# Patient Record
Sex: Female | Born: 1965 | Hispanic: Yes | Marital: Single | Smoking: Never smoker
Health system: Western US, Academic
[De-identification: ages and names within clinical notes are randomized; demographics above are authoritative.]

## PROBLEM LIST (undated history)

## (undated) ENCOUNTER — Emergency Department: Payer: MEDICAID | Attending: Emergency Medicine | Admitting: Emergency Medicine

## (undated) DIAGNOSIS — R112 Nausea with vomiting, unspecified: Secondary | ICD-10-CM

## (undated) DIAGNOSIS — E119 Type 2 diabetes mellitus without complications: Secondary | ICD-10-CM

## (undated) DIAGNOSIS — L52 Erythema nodosum: Secondary | ICD-10-CM

## (undated) DIAGNOSIS — N200 Calculus of kidney: Secondary | ICD-10-CM

## (undated) DIAGNOSIS — M222X9 Patellofemoral disorders, unspecified knee: Secondary | ICD-10-CM

## (undated) DIAGNOSIS — J45909 Unspecified asthma, uncomplicated: Secondary | ICD-10-CM

## (undated) DIAGNOSIS — Z9289 Personal history of other medical treatment: Secondary | ICD-10-CM

## (undated) DIAGNOSIS — Z87442 Personal history of urinary calculi: Secondary | ICD-10-CM

## (undated) HISTORY — PX: CHOLECYSTECTOMY: SHX55

## (undated) HISTORY — PX: KNEE SURGERY: SHX244

## (undated) HISTORY — PX: LITHOTRIPSY: SUR834

## (undated) HISTORY — DX: Patellofemoral disorders, unspecified knee: M22.2X9

## (undated) HISTORY — DX: Calculus of kidney: N20.0

## (undated) HISTORY — DX: Unspecified asthma, uncomplicated: J45.909

## (undated) HISTORY — DX: Type 2 diabetes mellitus without complications: E11.9

## (undated) HISTORY — DX: Personal history of other medical treatment: Z92.89

## (undated) HISTORY — DX: Erythema nodosum: L52

---

## 1992-10-04 HISTORY — PX: LIGATION, FALLOPIAN TUBE, BILATERAL, USING TUBAL RING: SHX003529

## 1992-12-05 ENCOUNTER — Other Ambulatory Visit: Payer: Self-pay

## 1996-01-20 ENCOUNTER — Other Ambulatory Visit: Payer: Self-pay

## 1996-05-11 ENCOUNTER — Other Ambulatory Visit: Payer: Self-pay

## 2007-05-13 ENCOUNTER — Emergency Department: Payer: Self-pay | Admitting: Internal Medicine

## 2007-05-16 ENCOUNTER — Ambulatory Visit: Payer: Self-pay | Admitting: Internal Medicine

## 2008-11-13 ENCOUNTER — Ambulatory Visit: Payer: Self-pay

## 2010-04-22 ENCOUNTER — Ambulatory Visit: Payer: Self-pay | Admitting: Family Medicine

## 2010-04-22 ENCOUNTER — Emergency Department: Payer: Self-pay | Admitting: Unknown Physician Specialty

## 2011-01-04 ENCOUNTER — Ambulatory Visit: Payer: Self-pay | Admitting: Family Medicine

## 2012-07-08 HISTORY — PX: COLONOSCOPY: SHX000273

## 2012-12-02 ENCOUNTER — Inpatient Hospital Stay: Payer: Self-pay | Admitting: Internal Medicine

## 2012-12-02 ENCOUNTER — Ambulatory Visit: Payer: Self-pay | Admitting: Urology

## 2012-12-02 LAB — URINALYSIS, COMPLETE
Bilirubin,UR: NEGATIVE
Glucose,UR: NEGATIVE mg/dL (ref 0–75)
Nitrite: NEGATIVE
Protein: NEGATIVE
RBC,UR: 2 /HPF (ref 0–5)
Specific Gravity: 1.012 (ref 1.003–1.030)
Squamous Epithelial: 13
WBC UR: 25 /HPF (ref 0–5)

## 2012-12-02 LAB — CBC
HCT: 42.5 % (ref 35.0–47.0)
HGB: 14.9 g/dL (ref 12.0–16.0)
HGB: 12.5 g/dL (ref 12.0–16.0)
MCH: 33.5 pg (ref 26.0–34.0)
MCHC: 35.2 g/dL (ref 32.0–36.0)
MCHC: 33.3 g/dL (ref 32.0–36.0)
MCV: 95 fL (ref 80–100)
MCV: 96 fL (ref 80–100)
Platelet: 245 10*3/uL (ref 150–440)
Platelet: 197 10*3/uL (ref 150–440)
RBC: 4.46 10*6/uL (ref 3.80–5.20)
RBC: 3.89 10*6/uL (ref 3.80–5.20)
RDW: 12.6 % (ref 11.5–14.5)
RDW: 12.3 % (ref 11.5–14.5)
WBC: 13.7 10*3/uL — ABNORMAL HIGH (ref 3.6–11.0)

## 2012-12-02 LAB — COMPREHENSIVE METABOLIC PANEL
Anion Gap: 8 (ref 7–16)
Anion Gap: 5 — ABNORMAL LOW (ref 7–16)
BUN: 12 mg/dL (ref 7–18)
BUN: 11 mg/dL (ref 7–18)
Bilirubin,Total: 1.2 mg/dL — ABNORMAL HIGH (ref 0.2–1.0)
Calcium, Total: 8.5 mg/dL (ref 8.5–10.1)
Chloride: 105 mmol/L (ref 98–107)
Chloride: 110 mmol/L — ABNORMAL HIGH (ref 98–107)
Co2: 26 mmol/L (ref 21–32)
EGFR (African American): >60
EGFR (Non-African Amer.): >60
Glucose: 95 mg/dL (ref 65–99)
Potassium: 3.3 mmol/L — ABNORMAL LOW (ref 3.5–5.1)
Potassium: 3.5 mmol/L (ref 3.5–5.1)
SGOT(AST): 17 U/L (ref 15–37)
SGPT (ALT): 74 U/L (ref 12–78)
Sodium: 138 mmol/L (ref 136–145)
Sodium: 141 mmol/L (ref 136–145)
Total Protein: 7.3 g/dL (ref 6.4–8.2)
Total Protein: 5.9 g/dL — ABNORMAL LOW (ref 6.4–8.2)

## 2012-12-02 LAB — LIPASE, BLOOD
Lipase: 88 U/L (ref 73–393)
Lipase: 58 U/L — ABNORMAL LOW (ref 73–393)

## 2012-12-03 LAB — BASIC METABOLIC PANEL
Anion Gap: 4 — ABNORMAL LOW (ref 7–16)
BUN: 11 mg/dL (ref 7–18)
Calcium, Total: 8.1 mg/dL — ABNORMAL LOW (ref 8.5–10.1)
Chloride: 114 mmol/L — ABNORMAL HIGH (ref 98–107)
Co2: 25 mmol/L (ref 21–32)
EGFR (African American): >60
EGFR (Non-African Amer.): >60
Glucose: 98 mg/dL (ref 65–99)
Osmolality: 284 (ref 275–301)
Potassium: 3.7 mmol/L (ref 3.5–5.1)
Sodium: 143 mmol/L (ref 136–145)

## 2012-12-03 LAB — CBC WITH DIFFERENTIAL/PLATELET
Basophil #: 0 10*3/uL (ref 0.0–0.1)
Eosinophil %: 0.6 %
HCT: 35.1 % (ref 35.0–47.0)
HGB: 12.1 g/dL (ref 12.0–16.0)
Lymphocyte %: 9.9 %
MCH: 33.4 pg (ref 26.0–34.0)
MCHC: 34.5 g/dL (ref 32.0–36.0)
Monocyte #: 0.6 x10 3/mm (ref 0.2–0.9)
Neutrophil %: 82.8 %
Platelet: 190 10*3/uL (ref 150–440)
RBC: 3.63 10*6/uL — ABNORMAL LOW (ref 3.80–5.20)
RDW: 12.5 % (ref 11.5–14.5)

## 2012-12-03 LAB — URIC ACID: Uric Acid: 2.9 mg/dL (ref 2.6–6.0)

## 2012-12-05 LAB — URINE CULTURE

## 2012-12-08 LAB — CULTURE, BLOOD (SINGLE)

## 2012-12-13 ENCOUNTER — Ambulatory Visit: Payer: Self-pay | Admitting: Urology

## 2012-12-25 ENCOUNTER — Ambulatory Visit: Payer: Self-pay | Admitting: Urology

## 2012-12-27 ENCOUNTER — Ambulatory Visit: Payer: Self-pay | Admitting: Urology

## 2013-01-01 ENCOUNTER — Ambulatory Visit: Payer: Self-pay | Admitting: Urology

## 2013-01-09 ENCOUNTER — Ambulatory Visit: Payer: Self-pay | Admitting: Urology

## 2013-01-12 DIAGNOSIS — N200 Calculus of kidney: Secondary | ICD-10-CM | POA: Insufficient documentation

## 2013-02-02 ENCOUNTER — Ambulatory Visit: Payer: Self-pay | Admitting: Urology

## 2014-12-06 ENCOUNTER — Ambulatory Visit: Payer: Self-pay | Admitting: Primary Care

## 2014-12-20 ENCOUNTER — Ambulatory Visit: Payer: Self-pay | Admitting: Primary Care

## 2015-01-24 NOTE — Op Note (Signed)
PATIENT NAME:  Dana GottronGUZMAN, Eevee MR#:  [id number] DATE OF BIRTH:  06/10/1966  DATE OF SURGERY:  12/28/2012  PREOPERATIVE DIAGNOSIS: Right renal pelvic calculus.   POSTOPERATIVE DIAGNOSIS: Right renal pelvic calculus.   PROCEDURES:   1.  Right ureteropyeloscopy with laser lithotripsy.  2.  Placement of right ureteral stent.   SURGEON: Dr. Lonna CobbStoioff   ASSISTANT: None.   ANESTHESIA: General.   INDICATION: A 49 year old female presented approximately 3 weeks ago with right renal colic and found to have a 1.8 cm right UPJ stone. She had a ureteral stent placed by the covering urologist. The stone density was greater than 1000 Hounsfield units. After discussion of options, she has elected attempt at ureteroscopic removal.   DESCRIPTION: She was taken to the operating room where general anesthetic was administered. Her external genitalia were prepped in the usual fashion. A time-out was performed per protocol, with all in agreement. A 21-French cystoscope sheath with obturator was lubricated and passed per urethra. No bladder mucosal lesions were identified. The right ureteral stent was grasped and brought out through the meatus. A 0.035 guidewire was then placed through the stent and into the right renal pelvis past the stone, which was easily seen on fluoroscopy. The ureteral stent was removed. An Olympus digital flexible ureteroscope was then  passed per urethra. The right ureteral orifice was easily engaged and passed proximally without use of a guidewire or sheath. A stone was easily visualized in the renal pelvis. A 200 micron  laser fiber was placed through the ureteroscope. At a power of 5 watts with a rapid rate, the stone was easily broken into multiple small fragments. These fragments were further fragmented using "popcorn" settings. No larger fragments were seen on fluoroscopy. The ureteroscope was removed. A 6-French/22 cm Contour ureteral stent was placed. There was good curl seen in the renal  pelvis on fluoroscopy. The distal end of the stent was well-positioned in the bladder. The patient was taken to PACU in stable condition. There were no complications.   EBL: Minimal.     ____________________________ Verna CzechScott C. Lonna CobbStoioff, MD scs:dm D: 12/28/2012 07:51:00 ET T: 12/28/2012 08:11:39 ET JOB#: [id number]  cc: Lorin PicketScott C. Lonna CobbStoioff, MD, <Dictator> Riki AltesSCOTT C STOIOFF MD ELECTRONICALLY SIGNED 01/11/2013 8:03

## 2015-01-24 NOTE — Discharge Summary (Signed)
PATIENT NAME:  Dana Jimenez, Dana Jimenez MR#:  [id number] DATE OF BIRTH:  02-03-1966  DATE OF ADMISSION:  12/02/2012 DATE OF DISCHARGE:  12/04/2012  ADMITTING PHYSICIAN: Dr. Elpidio AnisSudini.    DISCHARGING PHYSICIAN: Dr. Enid Baasadhika Kalisetti   CONSULTATIONS IN THE HOSPITAL: Urology consultation by Dr. Milus HeightEdward Houser.   PR[facility] C[facility] PHYSIC[facility].     DISCHARGE DIAGNOSES:  1.  A 1.8 cm right ureteral stone blocking the ureteropelvic junction.  2.  Moderate right-sided hydronephrosis, status post ureteral stent placement.  3.  Urinary tract infection.  4.  Pyelonephritis.  5.  Constipation.   DISCHARGE MEDICATIONS: 1.  Norco 5/325mg , 1 tablet every 4 hours as needed for pain.  2.  Levaquin 500 mg p.o. daily for 3 more days.   DISCHARGE DIET: Regular diet.   DISCHARGE ACTIVITY: As tolerated.    FOLLOWUP INSTRUCTIONS:  Urology followup with Dr. Lonna CobbStoioff in 1 to 2 weeks.   LABS AND IMAGING STUDIES:  1.  WBC 10.1, hemoglobin 12.1, hematocrit 35.1, platelet count 190.  2.  Sodium 143, potassium 3.7, chloride 114, bicarbonate 25, BUN 11 and creatinine 0.76. Glucose is 98 and calcium 8.1.  3.  Uric acid is 2.9.  4.  ANA panel is negative.  5.  Blood cultures were negative.  6.  Urine cultures are growing 70,000 colonies of pansensitive Escherichia coli.  7.  CT of the abdomen and pelvis workup showing 18 mm calculus at the right ureteropelvic junction resulting in moderate right-sided hydronephrosis and perinephric stranding.  8.  Right and left ankle x-rays revealed normal ankles but on the left side there a lucent lesion.  It could be cyst or lipoma in the mid left calcaneus but no significant joint effusion or acute abnormalities.    BRIEF HOSPITAL COURSE: The patient is a 49 year old young female with no significant past medical history other than history of nephrolithiasis, comes to the hospital secondary to right flank pain. CT of the abdomen revealed hydronephrosis with 1.8 cm stone.  1.   Acute pyelonephritis and UTI secondary to right ureteral stone resulting in moderate right-sided hydronephrosis. She was admitted and was started on IV antibiotics for her infection and was seen by urology and had urgent cystoscopy and stent placement in right ureteropelvic junction. Her abdominal pain subsided, white count improved and she was able to ambulate well without any difficulty. She is supposed to follow up with urology as an outpatient in 1 to 2 weeks. She is being discharged on Levaquin to finish off an antibiotic course for her urinary tract infection.  2.  Also, post procedure the patient had pain in both ankles, trouble with dorsiflexion, possibly inflammatory polyarthritis which was equal bilaterally; however, that improved after 1 dose of steroid though the inflammatory markers were negative. She has not had further trouble and was ambulating well prior to discharge. She is being discharged on pain medication to be used as needed. Her course has been otherwise uneventful in the hospital.   DISCHARGE CONDITION: Stable.   DISCHARGE DISPOSITION: Home.   TIME SPENT ON DISCHARGE: 40 minutes.     ____________________________ Enid Baasadhika Kalisetti, MD rk:cs D: 12/05/2012 17:15:00 ET T: 12/05/2012 19:08:08 ET JOB#: [id number]  cc: Enid Baasadhika Kalisetti, MD, <Dictator> Enid BaasADHIKA KALISETTI MD ELECTRONICALLY SIGNED 12/06/2012 13:34

## 2015-01-24 NOTE — H&P (Signed)
PATIENT NAME:  Dana Jimenez, Dana Jimenez MR#:  [id number] DATE OF BIRTH:  05-10-1966  DATE OF ADMISSION:  12/02/2012  PRIMARY CARE PHYSICIAN:  [facility]. The patient visits as needed.   CHIEF COMPLAINT:  Right flank pain.   HISTORY OF PRESENTING ILLNESS:  The patient is a 49 year old female patient with a history of nephrolithiasis come to the hospital with acute onset of right flank pain and chills. The patient has had this pain radiate to her groin and the back. In the Emergency Room, she has had nausea but no vomiting, no shortness of breath, nausea and vomiting. No aggravating factors of the pain. CT scan of the abdomen showed 18 mm calculus in the UPJ junction with hydronephrosis and perinephric standing. UA shows UTI and the Hospitalist team admitting the patient.   PAST MEDICAL HISTORY:  Nephrolithiasis.   SOCIAL HISTORY:  The patient does not smoke. No alcohol. No illicit drugs. Works as a Location manager.   CODE STATUS:  FULL CODE.   FAMILY HISTORY:  History of uterine cancer in mother and skin cancer in her dad with which they died of. Sister has diabetes.  ALLERGIES:  No known drug allergies.   HOME MEDICATIONS:  None.   REVIEW OF SYSTEMS: CONSTITUTIONAL:  Complains of some fatigue. No weight loss, weight gain.  EYES:  No blurred vision, pain, or redness.  ENT:  No tinnitus, hearing loss, or nasal congestion.  CARDIOVASCULAR:  No chest pain, syncope, PND, or orthopnea.  RESPIRATORY:  No shortness of breath, wheezing, hemoptysis.  GASTROINTESTINAL:  Has nausea. No vomiting, has abdominal pain. No diarrhea.  GENITOURINARY:  Has the UPJ right-sided 80 mm calculus.  ENDOCRINE:  No hypothyroidism, polyuria or polydipsia.  HEMATOLOGIC:  No anemia, easy bruising, bleeding.  MUSCULOSKELETAL:  No arthritis, back pain.  NEUROLOGIC:  No focal numbness, weakness, dysarthria.  PSYCHIATRIC:  No anxiety or depression.   PHYSICAL EXAMINATION:  VITAL SIGNS:  Temperature 97.6, pulse 82, blood  pressure 140/84, saturating 99% on room air.  GENERAL:  Moderately built female patient lying in bed, comfortable, conversational, cooperative with exam.  PSYCHIATRIC:  Alert, oriented x 3. Mood and affect are appropriate. Judgment intact.  HEENT:  Atraumatic, normocephalic. Oral mucosa are moist and pink. External ears and nose normal. No pallor or icterus. Pupils bilaterally equal and reactive to light.  NECK:  Supple. No thyromegaly. No palpable lymph nodes. Trachea midline. No carotid bruit, JVD.  CARDIOVASCULAR:  S1, S2, regular rate and rhythm without any murmurs.  RESPIRATORY:  Normal work of breathing. Clear to auscultation on both sides.  ABDOMEN:  Soft abdomen. Tenderness in the right flank area. No hepatosplenomegaly palpable. Bowel sounds present.  GENITOURINARY:  No CVA tenderness, bladder distention.  SKIN:  Warm and dry. No petechiae, rash, ulcers.  MUSCULOSKELETAL:  No joint swelling, redness, effusion of the large joints. Normal muscle tone.  NEUROLOGICAL:  Motor strength 5/5 in upper and lower extremities. Sensation to fine touch intact all over. Cranial nerves II through XII intact.  LYMPHATIC:  No cervical lymphadenopathy.   LABORATORY, DIAGNOSTIC, AND RADIOLOGICAL DATA:  Glucose of 95, BUN 12, creatinine 0.82 with potassium of 3.3. WBC 6.3, hemoglobin 14.9. Urinalysis shows 1+ bacteria and 25 WBCs with 2 RBC.   CT scan of the abdomen shows 18 mm right UPJ calculus with hydronephrosis and perinephric stranding.   ASSESSMENT AND PLAN:  1.  Right-sided 18 mm ureteropelvic junction calculus with hydronephrosis and perinephric stranding in the setting of urinary tract infection. This is a  urological emergency and the patient will be admitted. I have discussed with the urologist, Dr. Berna SpareMarcus, who will take the patient to the OR today. We will get started the patient started on stat IV antibiotics with ureter calculus and urinary tract infection. We will get blood cultures, urine  cultures. The patient has high risk for deterioration and needs to be closely monitored as inpatient. If the patient is afebrile and does not have any elevated white count, she should be able to go home tomorrow after urological stent is placed today.  2.  Hypokalemia. Replace as needed.  3.  Deep vein thrombosis prophylaxis with heparin.   CODE STATUS:  FULL CODE.   TIME SPENT:  60 minutes.   ____________________________ Molinda BailiffSrikar R. Tereso Unangst, MD srs:jm D: 12/02/2012 16:51:44 ET T: 12/02/2012 17:48:10 ET JOB#: [id number]  cc: Wardell HeathSrikar R. Marshel Golubski, MD, <Dictator> [facility] Jerimiah Wolman [place] Bali Elfrida Pixley MD ELECTRONICALLY SIGNED 12/04/2012 14:51

## 2015-01-24 NOTE — Consult Note (Signed)
Chief Complaint:  Subjective/Chief Complaint POD 1  no events no c/o afebrile overnight   VITAL SIGNS/ANCILLARY NOTES: **Vital Signs.:   02-Mar-14 05:45  Vital Signs Type Routine  Temperature Temperature (F) 98  Celsius 36.6  Temperature Source oral  Pulse Pulse 89  Respirations Respirations 18  Systolic BP Systolic BP 91  Diastolic BP (mmHg) Diastolic BP (mmHg) 55  Mean BP 67  Pulse Ox % Pulse Ox % 96  Pulse Ox Activity Level  At rest  Oxygen Delivery Room Air/ 21 %  *Intake and Output.:   02-Mar-14 04:11  Grand Totals Intake:  2396 Output:      Net:  2396 24 Hr.:  2396  Oral Intake      In:  480  IV (Primary)      In:  1916   Brief Assessment:  Cardiac Regular   Respiratory normal resp effort   Gastrointestinal Normal   Gastrointestinal details normal Soft   Additional Physical Exam foley - clear   Lab Results:  Routine Chem:  02-Mar-14 04:45   Glucose, Serum 98  BUN 11  Creatinine (comp) 0.76  Sodium, Serum 143  Potassium, Serum 3.7  Chloride, Serum  114  CO2, Serum 25  Calcium (Total), Serum  8.1  Anion Gap  4  Osmolality (calc) 284  eGFR (African American) >60  eGFR (Non-African American) >60 (eGFR values <32m/min/1.73 m2 may be an indication of chronic kidney disease (CKD). Calculated eGFR is useful in patients with stable renal function. The eGFR calculation will not be reliable in acutely ill patients when serum creatinine is changing rapidly. It is not useful in  patients on dialysis. The eGFR calculation may not be applicable to patients at the low and high extremes of body sizes, pregnant women, and vegetarians.)  Routine Hem:  02-Mar-14 04:45   WBC (CBC) 10.1  RBC (CBC)  3.63  Hemoglobin (CBC) 12.1  Hematocrit (CBC) 35.1  Platelet Count (CBC) 190  MCV 97  MCH 33.4  MCHC 34.5  RDW 12.5  Neutrophil % 82.8  Lymphocyte % 9.9  Monocyte % 6.3  Eosinophil % 0.6  Basophil % 0.4  Neutrophil #  8.4  Lymphocyte # 1.0  Monocyte # 0.6   Eosinophil # 0.1  Basophil # 0.0 (Result(s) reported on 03 Dec 2012 at 05:26AM.)   Assessment/Plan:  Assessment/Plan:  Assessment S/P R Ureteral Stent Placement for 1.8cm R UPJ stone, no sign of urosepsis overnight   Plan - D/C foley - Ok for D/C from GU perspective - Reccomend oupt abx 7-14 days - F/u with Urologist for definitive stone management (Dr. SBernardo Heater   Electronic Signatures: HFelicity Coyer(MD)  (Signed 02-Mar-14 06:13)  Authored: Chief Complaint, VITAL SIGNS/ANCILLARY NOTES, Brief Assessment, Lab Results, Assessment/Plan   Last Updated: 02-Mar-14 06:13 by HFelicity Coyer(MD)

## 2015-01-24 NOTE — Consult Note (Signed)
   DATE OF BIRTH:  1966-03-12  DATE OF CONSULTATION:  12/02/2012  REFERRING PHYSICIAN:  Emergency Room  CONSULTING PHYSICIAN:  Tawni MillersEdward R. Weldon InchesHouser, II, MD  REASON FOR CONSULTATION:  Obstructing renal stone.   REPORT OF CONSULTATION:  The patient is a 49 year old female with a history of  known nephrolithiasis many years ago, status post unknown surgical procedure for these, who presented to the hospital with 3 days of acute onset right flank pain, chills, nausea, vomiting. Pain radiates from her back to her groin. In the Emergency Room, she did have nausea, but no vomiting. She denies any shortness of breath or any subjective fever. CT scan obtained by the ER shows a 1.8 cm right ureteropelvic junction stone with obstruction. Her UA  is concerning for a urinary tract infection. She is currently being admitted by the hospitalist team, and I am requested for consultation.   PAST MEDICAL HISTORY:  Nephrolithiasis.   SOCIAL HISTORY:  No smoking, no alcohol.   FAMILY HISTORY:  Uterine carcinoma, otherwise noncontributory.   ALLERGIES:  No known drug allergies.  OUTPATIENT MEDICATIONS:  None.  REVIEW OF SYSTEMS:  All of the 12-point review of systems negative, other than outlined above in the HPI.  PHYSICAL EXAMINATION:  VITAL SIGNS:  The patient's temperature is 97.6, heart rate, blood pressure 140/84, satting 99% on room air.  GENERAL:  She is alert and oriented x3, conversational, interactive, healthy-appearing female.  PSYCHIATRIC:  Appropriate.  HEENT:  Normocephalic, atraumatic, nonicteric.  NECK:  Supple, without any lymphadenopathy.  CARDIOVASCULAR:  Regular rate and rhythm.  RESPIRATORY:  Nonlabored and clear anteriorly. ABDOMEN:  Soft, nontender, nondistended. She has some tenderness in the right costovertebral area. She has normal bowel sounds.  GENITOURINARY:  Normal external female genitalia.  SKIN:  Warm and dry.  MUSCULOSKELETAL:  Strength 5/5.  NEUROLOGIC:  Grossly intact.   EXTREMITIES:  No cyanosis, clubbing or edema.   LABORATORY VALUES:  The patient's creatinine is 0.82. She has a white blood cell count of 16.3.  IMAGING:  CT scan report and images reviewed by me, agree with read of 1.8 cm right UPJ stone with hydronephrosis, perinephric stranding.   ASSESSMENT: A 49 year old female with obstructing right ureteral stone in the setting of a presumed urinary tract infection. The patient will not be able to pass this stone. Therefore, we elected to proceed with right ureteral stent placement. She was counseled as such. She understands there will be no definitive treatment of the stone today. She has received Rocephin by the hospitalist service and will be admitted to them. I am arranging for her to undergo right ureteral stent placement. She will get definitive stone management with a urologist as an outpatient.      ____________________________ Tawni MillersEdward R. Weldon InchesHouser, II, MD erh:mr D: 12/02/2012 18:16:17 ET T: 12/02/2012 18:59:32 ET JOB#: [id number]  cc: Tawni MillersEdward R. Weldon InchesHouser, II, MD, <Dictator> Beaulah CorinEDWARD R Anastasha Ortez MD ELECTRONICALLY SIGNED 12/03/2012 6:06

## 2015-01-24 NOTE — Op Note (Signed)
PATIENT NAME:  Dana Jimenez, Dana Jimenez MR#:  [id number] DATE OF BIRTH:  October 12, 1965  DATE OF PROCEDURE:  12/02/2012  PREOPERATIVE DIAGNOSIS: Right obstructing 1.8 cm ureteropelvic junction stone.  POSTOPERATIVE DIAGNOSIS: Right obstructing 1.8 cm ureteropelvic junction stone.  PROCEDURE PERFORMED: Cystoscopy with right ureteral stent placement and right retrograde pyelogram with interpretation.   SURGEON: Milus HeightEdward Stephnie Parlier, MD.  ASSISTANT: None.   ANESTHESIA: General.   COMPLICATIONS: None.  BLOOD LOSS: None.   INTRAOPERATIVE FINDINGS: A  1.8 cm right UPJ stone easily seen under fluoroscopy.   INDICATION: The patient is a 49 year old female presenting with nausea, vomiting and uncontrolled flank pain to the ER. She was found to have a 1.8 cm right ureteral pelvic junction obstructing stone with proximal hydronephrosis as well as concern for urinary tract infection. All risks, benefits, and alternatives of right ureteral stent were fully explained to the patient preoperatively. She understands no definitive stone management will be done to the stone today unless there is concern for infection and she agrees to proceed with a right ureteral stent placement.   DESCRIPTION OF PROCEDURE: The patient was identified in the preoperative holding. She was brought back to the operating room. She had been given 1 gram of Ancef. She was then placed under general anesthesia, prepped and draped in sterile dorsal lithotomy with position all pressure points padded appropriately. Timeout was then called for operative site, operation to be performed  and correct patient. All present were in agreement. Attention was then turned to the cystoscopy portion of the case. A 21-French cystoscope was used and inserted in the bladder. The bladder was found with a mass or lesion. The right ureteral orifice was easily cannulated. A 0.35 Glidewire through a 5-French open-ended ureteral catheter. The wire was inserted up past our easily  seen stone at the UPJ into the renal pelvis. The 5-French open-ended ureteral catheter was advanced over this and then a hydronephrotic drip was noted once the wire was removed. I did send samples of this for culture with aspiration. I then did a gentle retrograde pyelogram using 5 mL of 50% Isovue contrast outlining the hydronephrotic system. I then replaced the wire and removed the 5-French open-ended ureteral catheter. Then I placed a 6-French x 24 cm right double-J stent using a combination of direct visualization and fluoroscopic imaging. Excellent coil was noted distally visually in the bladder and stent with was coiled in the lower pole under fluoroscopic imaging. Immediately she began to express purulent urine from the right UO; therefore, I terminated the procedure and placed a 16-French Foley catheter to gravity bag drainage with 10 mL of sterile water. The patient was awakened from anesthesia and transitioned to PACU in stable condition.   DISPOSITION: She will be admitted to the floor by the hospitalist for perioperative management. ____________________________ Tawni MillersEdward R. Weldon InchesHouser, II, MD erh:aw D: 12/02/2012 18:12:01 ET T: 12/03/2012 06:44:46 ET JOB#: [id number]  cc: Tawni MillersEdward R. Weldon InchesHouser, II, MD, <Dictator> Beaulah CorinEDWARD R Jammal Sarr MD ELECTRONICALLY SIGNED 12/03/2012 20:56

## 2015-01-26 IMAGING — CR DG ANKLE COMPLETE 3+V*L*
1 series · 5 of 5 positions shown · non-contrast
Comparison: none

REASON FOR EXAM: pain and decreased motion
COMMENTS:

PROCEDURE:     DXR - DXR ANKLE LEFT COMPLETE  - December 03, 2012  [DATE]
RESULT:     Comparison: None

[Series 1: x ankle ap left · 0.14mm/px · 5 of 5 slices shown]
[im 1/5]
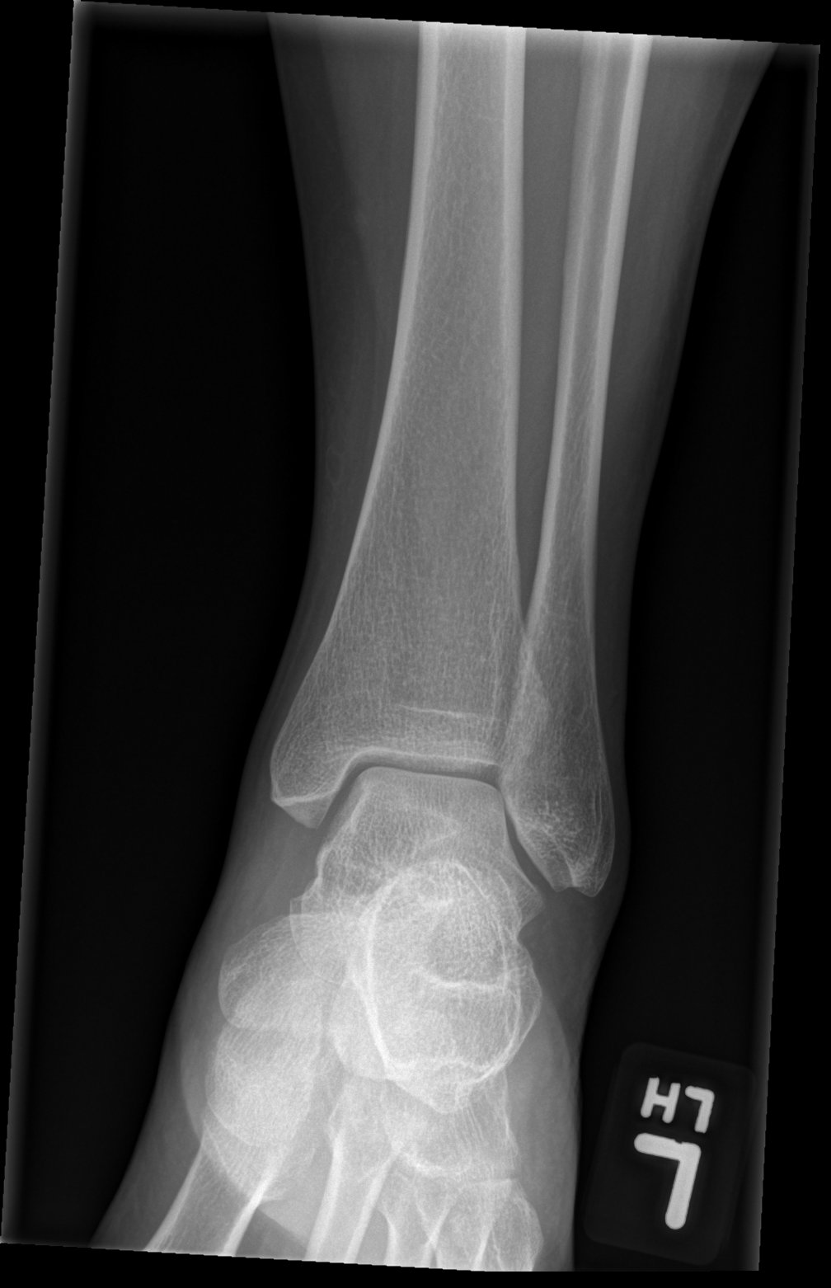
[im 2/5]
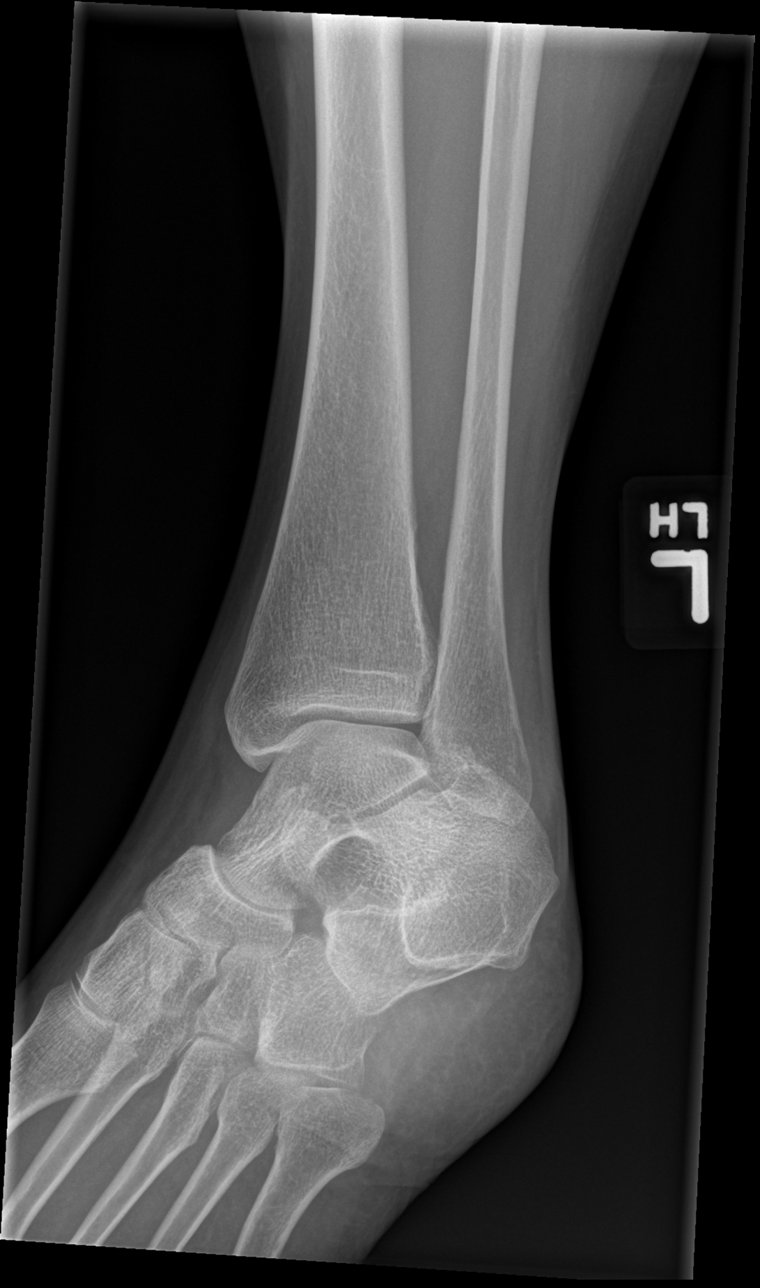
[im 3/5]
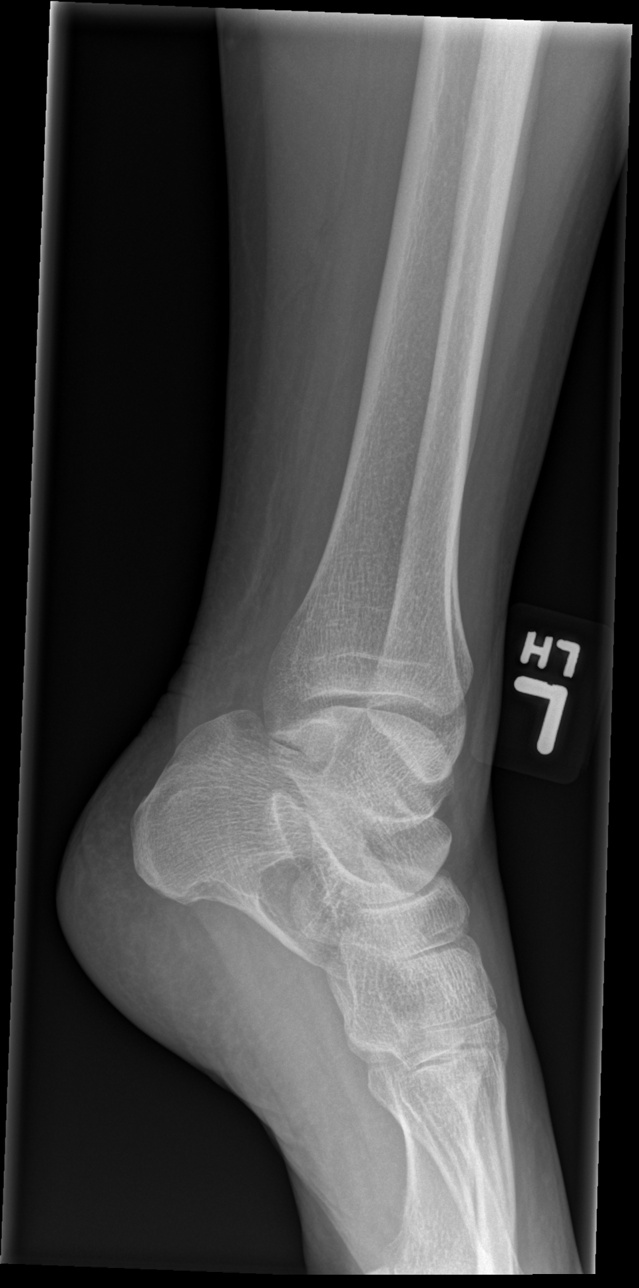
[im 4/5]
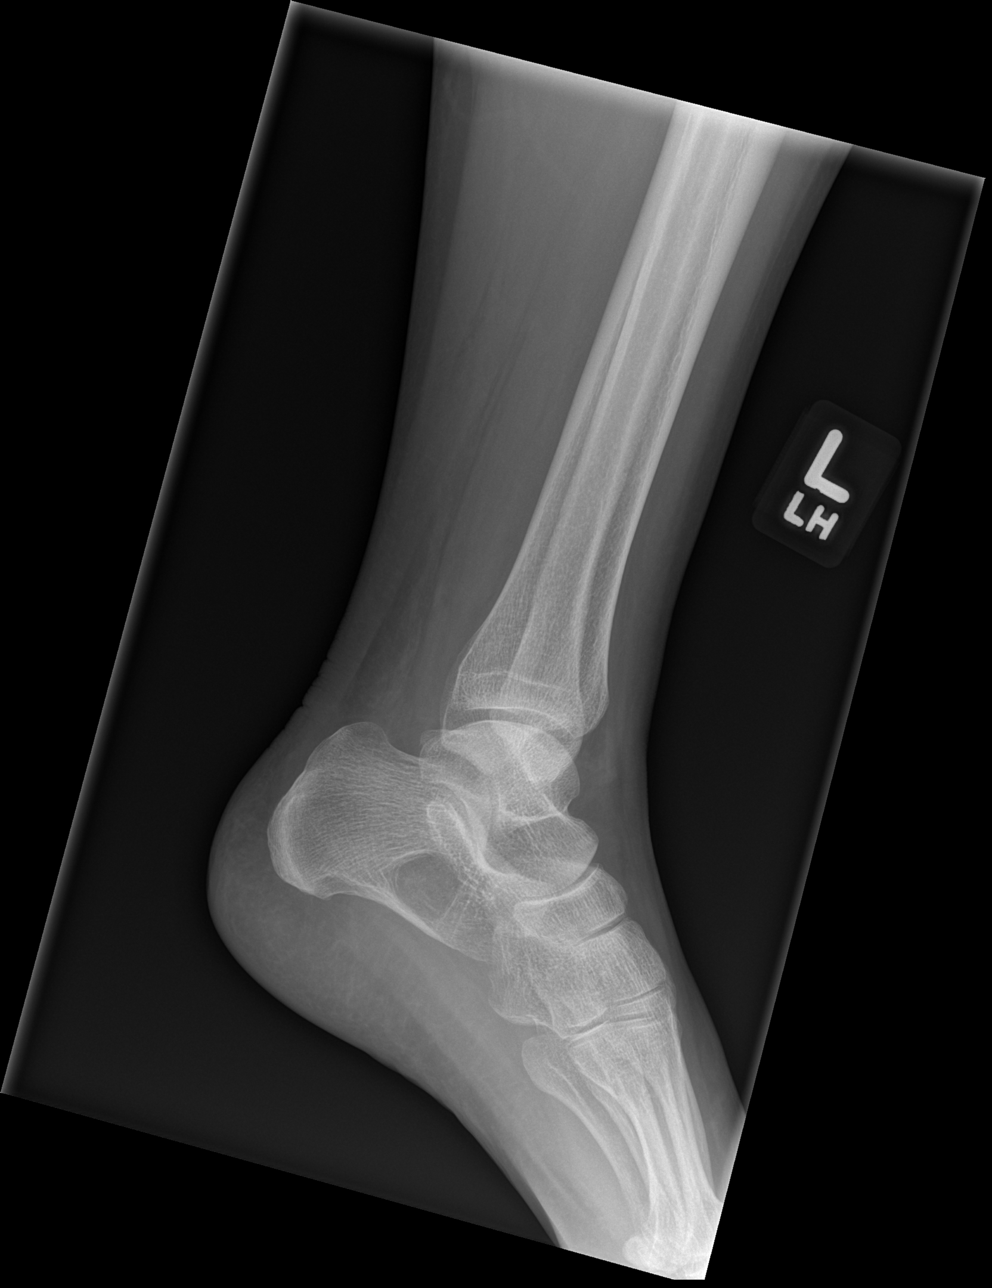
[im 5/5]
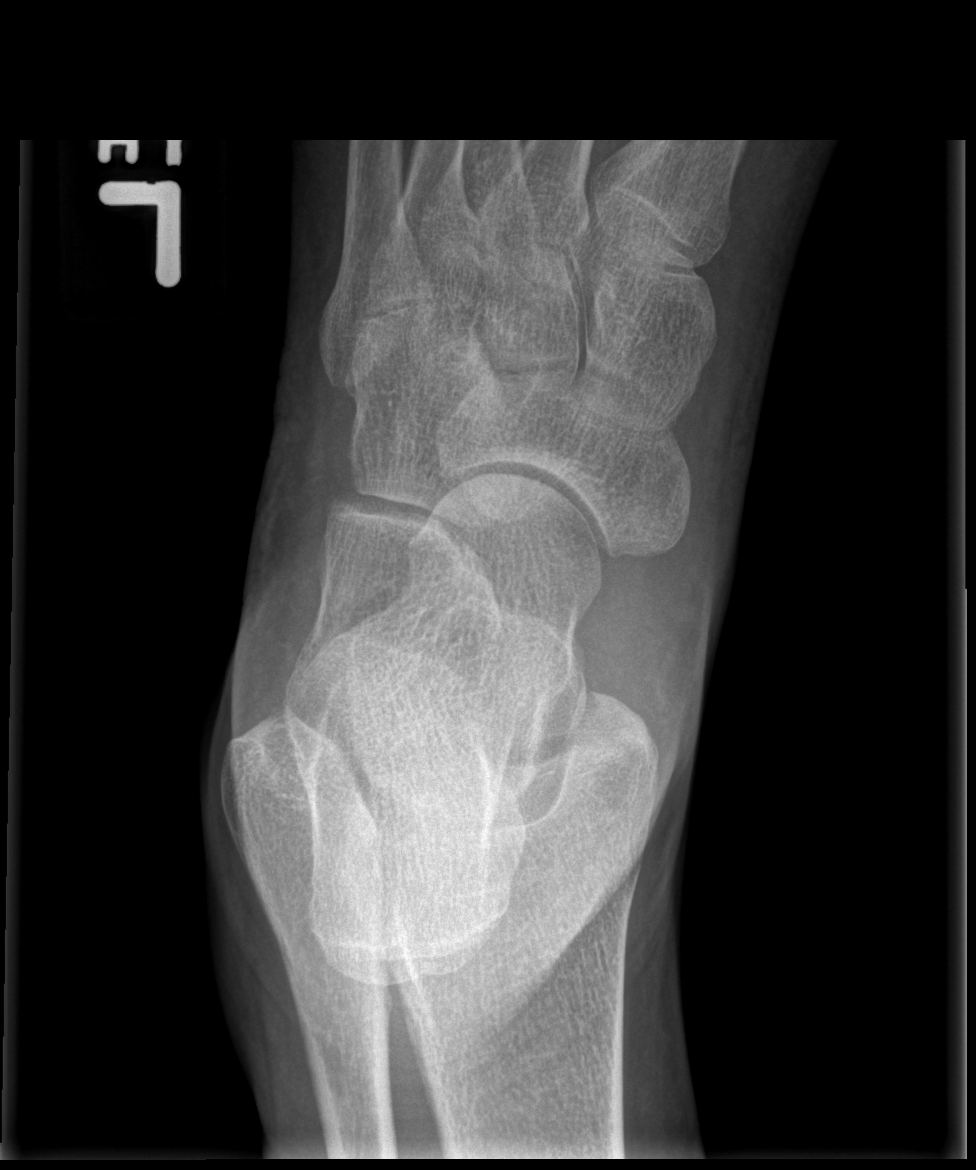

[5 of 5 positions shown; findings below may reference images not displayed]

FINDINGS: 5 views of the left ankle demonstrate no fracture or dislocation. There
ankle mortise is intact. There is a lucent lesion in the mid left calcaneus
which may represent a unicameral bone cyst or lipoma. There is no
significant joint effusion. The soft tissues are normal.
IMPRESSION: No acute osseous injury of the left ankle.

[REDACTED]

## 2015-01-26 IMAGING — CR RIGHT ANKLE - COMPLETE 3+ VIEW
1 series · 5 of 5 positions shown · non-contrast
Comparison: none

REASON FOR EXAM: pain and decreased motion
COMMENTS:

PROCEDURE:     DXR - DXR ANKLE RIGHT COMPLETE  - December 03, 2012  [DATE]
RESULT:     Comparison: None

[Series 1: x ankle ap right · 0.14mm/px · 5 of 5 slices shown]
[im 1/5]
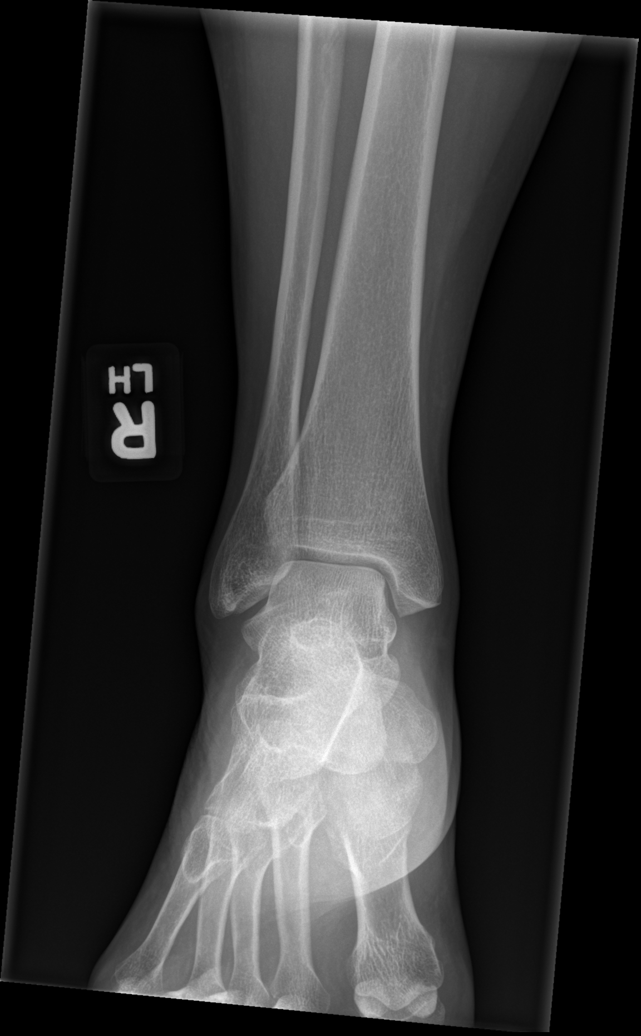
[im 2/5]
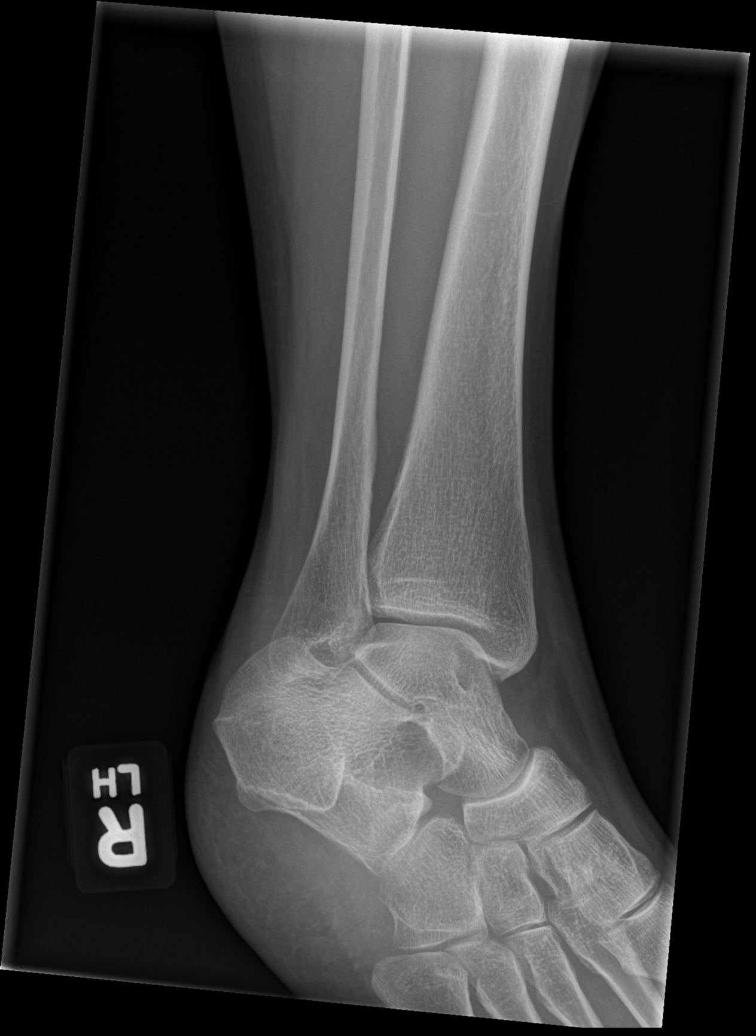
[im 3/5]
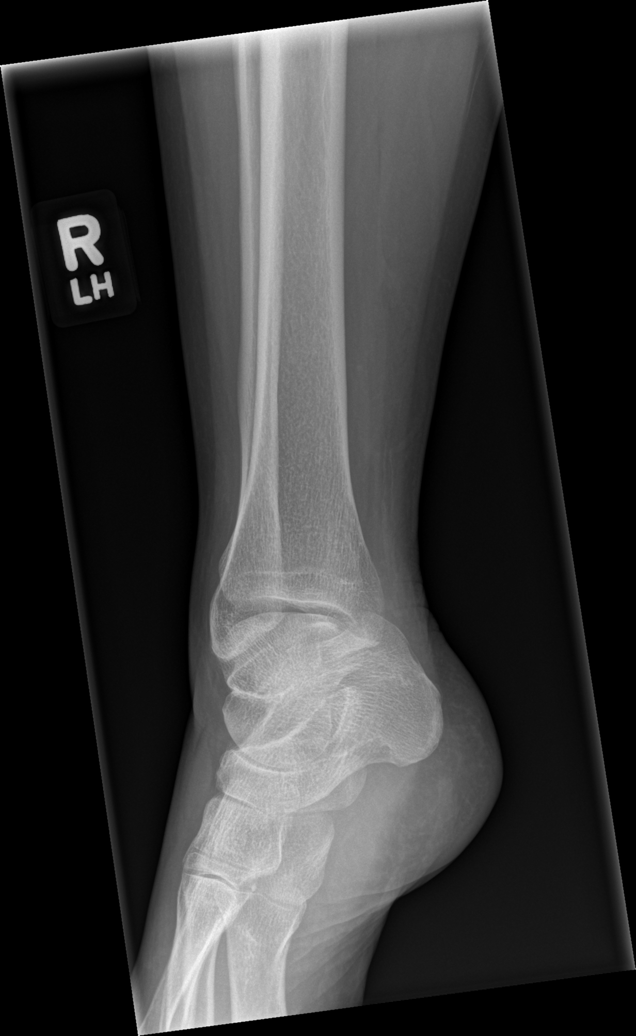
[im 4/5]
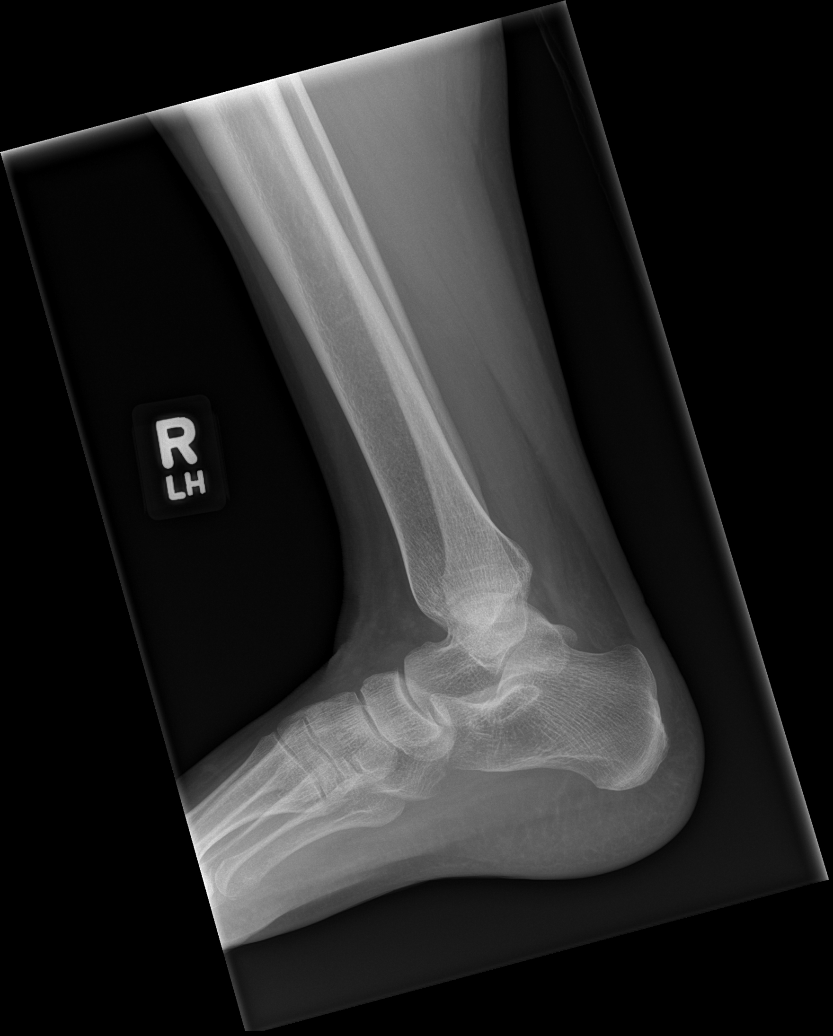
[im 5/5]
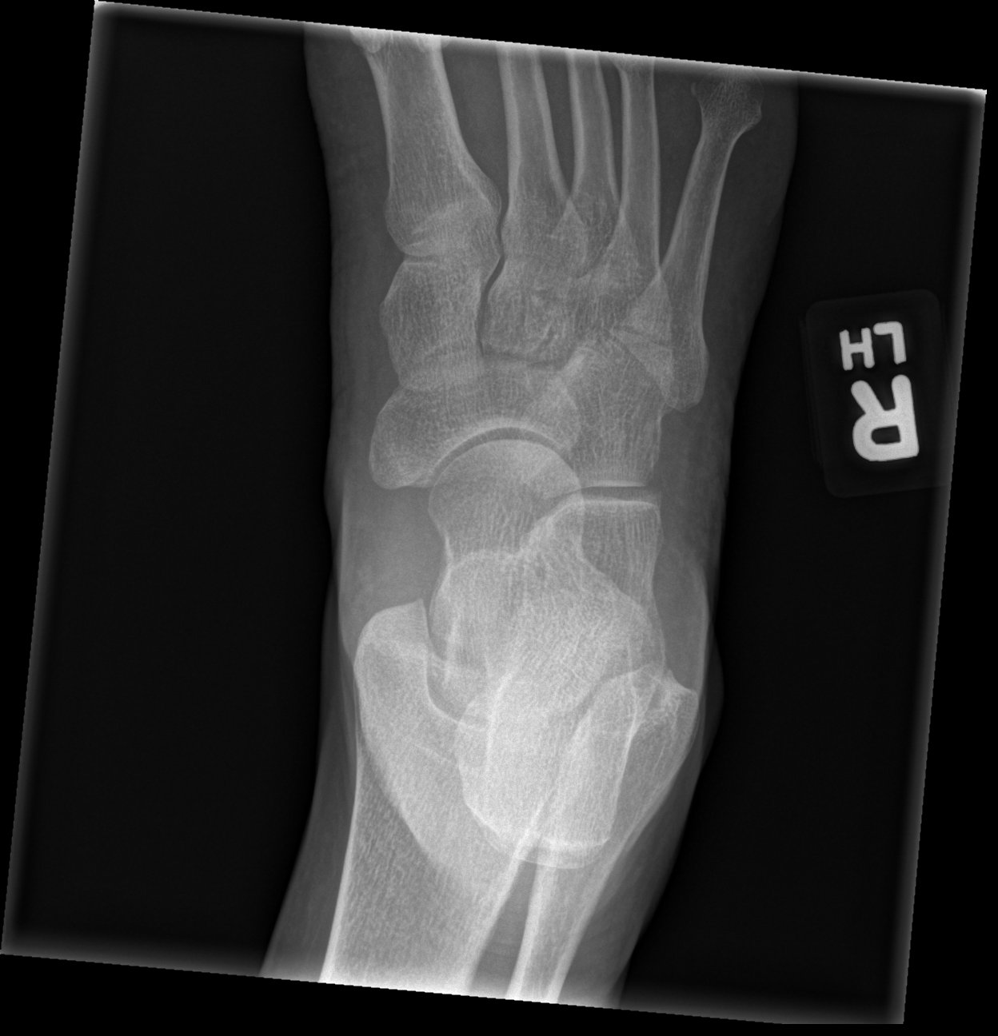

[5 of 5 positions shown; findings below may reference images not displayed]

FINDINGS: 5 views of the right ankle demonstrate no fracture or dislocation. There
ankle mortise is intact. There is no significant joint effusion. The soft
tissues are normal.
IMPRESSION: No acute osseous injury of the right ankle.

[REDACTED]

## 2015-02-24 HISTORY — PX: COLONOSCOPY: SHX000273

## 2015-02-24 HISTORY — PX: ESOPHAGOGASTRODUODENOSCOPY (EGD): SHX000442

## 2016-08-04 ENCOUNTER — Other Ambulatory Visit (HOSPITAL_BASED_OUTPATIENT_CLINIC_OR_DEPARTMENT_OTHER): Payer: Self-pay | Admitting: Internal Medicine

## 2016-08-04 MED ORDER — METFORMIN 1,000 MG TABLET
1000.0000 mg | ORAL_TABLET | Freq: Two times a day (BID) | ORAL | 1 refills | Status: DC
Start: 2016-08-04 — End: 2017-01-06

## 2016-09-01 ENCOUNTER — Telehealth (HOSPITAL_BASED_OUTPATIENT_CLINIC_OR_DEPARTMENT_OTHER): Payer: Self-pay | Admitting: Family Medicine

## 2016-09-01 ENCOUNTER — Ambulatory Visit: Attending: Family Medicine

## 2016-09-01 DIAGNOSIS — E1169 Type 2 diabetes mellitus with other specified complication: Principal | ICD-10-CM

## 2016-09-01 DIAGNOSIS — E669 Obesity, unspecified: Secondary | ICD-10-CM

## 2016-09-01 LAB — HEMOGLOBIN A1C
Hgb A1C,Glucose Est Avg: 163 mg/dL
Hgb A1C: 7.3 % — ABNORMAL HIGH (ref 4.0–5.6)

## 2016-09-01 NOTE — Telephone Encounter (Signed)
Barabra at lab called stating that pt is in this am to have A1C lab done but there is not order in the system. Can you add A1C lab? Pt would like to know if there are any other labs she need to have done before appt.

## 2016-09-01 NOTE — Telephone Encounter (Signed)
A1C done - I will need to review her chart for other labs - will discuss upcoming appt

## 2016-09-01 NOTE — Telephone Encounter (Signed)
Informed Barbara at the lab.

## 2016-09-06 ENCOUNTER — Telehealth (HOSPITAL_BASED_OUTPATIENT_CLINIC_OR_DEPARTMENT_OTHER): Payer: Self-pay | Admitting: Family Medicine

## 2016-09-06 DIAGNOSIS — Z1239 Encounter for other screening for malignant neoplasm of breast: Secondary | ICD-10-CM

## 2016-09-06 NOTE — Telephone Encounter (Signed)
Pt called requesting a new mammogram order. The one previously ordered has expired. Radiology needs new order.

## 2016-09-07 NOTE — Telephone Encounter (Signed)
L/m on personal line informing pt.

## 2016-09-07 NOTE — Telephone Encounter (Signed)
done

## 2016-09-09 ENCOUNTER — Encounter (HOSPITAL_BASED_OUTPATIENT_CLINIC_OR_DEPARTMENT_OTHER): Payer: Self-pay | Admitting: Family Medicine

## 2016-09-09 DIAGNOSIS — J45909 Unspecified asthma, uncomplicated: Secondary | ICD-10-CM | POA: Insufficient documentation

## 2016-09-09 DIAGNOSIS — M222X9 Patellofemoral disorders, unspecified knee: Secondary | ICD-10-CM | POA: Insufficient documentation

## 2016-09-09 DIAGNOSIS — L52 Erythema nodosum: Secondary | ICD-10-CM | POA: Insufficient documentation

## 2016-09-10 ENCOUNTER — Encounter (HOSPITAL_BASED_OUTPATIENT_CLINIC_OR_DEPARTMENT_OTHER): Payer: Self-pay | Admitting: Family Medicine

## 2016-09-10 ENCOUNTER — Ambulatory Visit (HOSPITAL_BASED_OUTPATIENT_CLINIC_OR_DEPARTMENT_OTHER): Admitting: Family Medicine

## 2016-09-10 ENCOUNTER — Other Ambulatory Visit (HOSPITAL_BASED_OUTPATIENT_CLINIC_OR_DEPARTMENT_OTHER): Payer: Self-pay | Admitting: Family Medicine

## 2016-09-10 ENCOUNTER — Ambulatory Visit
Admission: RE | Admit: 2016-09-10 | Discharge: 2016-09-10 | Disposition: A | Discharge status: Home / Self Care | Source: Ambulatory Visit | Attending: Family Medicine | Admitting: Family Medicine

## 2016-09-10 VITALS — BP 128/82 | HR 92 | Temp 97.3°F | Resp 16 | Ht 64.0 in | Wt 189.0 lb

## 2016-09-10 DIAGNOSIS — Z1231 Encounter for screening mammogram for malignant neoplasm of breast: Principal | ICD-10-CM

## 2016-09-10 DIAGNOSIS — Z1239 Encounter for other screening for malignant neoplasm of breast: Secondary | ICD-10-CM

## 2016-09-10 DIAGNOSIS — Z9289 Personal history of other medical treatment: Secondary | ICD-10-CM

## 2016-09-10 DIAGNOSIS — E1169 Type 2 diabetes mellitus with other specified complication: Secondary | ICD-10-CM

## 2016-09-10 DIAGNOSIS — E785 Hyperlipidemia, unspecified: Secondary | ICD-10-CM | POA: Insufficient documentation

## 2016-09-10 DIAGNOSIS — K635 Polyp of colon: Secondary | ICD-10-CM | POA: Insufficient documentation

## 2016-09-10 DIAGNOSIS — I10 Essential (primary) hypertension: Secondary | ICD-10-CM | POA: Insufficient documentation

## 2016-09-10 DIAGNOSIS — E669 Obesity, unspecified: Secondary | ICD-10-CM

## 2016-09-10 HISTORY — DX: Personal history of other medical treatment: Z92.89

## 2016-09-10 NOTE — Progress Notes (Signed)
HPI -   50 yr old patient presents for a follow  of his hypertension .Reports compliance with the medications and log of blood pressures at home has been stable .Denies any concerns.    2. Diabetes Mellitus - presents for a follow up . Currently on medications and tolerating them well .  Fasting blood sugars are mostly 120 - 130 , Denies any hypoglycemic episodes.   Blood sugars higher recently . Has been under increased stress recently , not exercising as regularly .    3. Dyslipidemia, tolerating statins well.Has not developed any muscle aches or pains.  Review of Systems   Constitutional: Negative.  Negative for activity change, appetite change, fatigue and fever.   HENT: Negative.  Negative for congestion, ear discharge, ear pain and hearing loss.    Eyes: Negative.    Respiratory: Negative.  Negative for cough, shortness of breath and stridor.    Cardiovascular: Negative.  Negative for chest pain.   Endocrine: Negative for cold intolerance, heat intolerance, polydipsia, polyphagia and polyuria.   Genitourinary: Negative.  Negative for dysuria and frequency.   Skin: Negative.    Neurological: Negative.  Negative for light-headedness and headaches.   Hematological: Negative.    Psychiatric/Behavioral: Negative for behavioral problems, confusion and sleep disturbance.   All other systems reviewed and are negative.  Physical Exam   Constitutional: She is oriented to person, place, and time. She appears well-developed and well-nourished.   HENT:   Head: Normocephalic and atraumatic.   Right Ear: External ear normal.   Left Ear: External ear normal.   Nose: Nose normal.   Mouth/Throat: Oropharynx is clear and moist.   Eyes: Conjunctivae and EOM are normal. Pupils are equal, round, and reactive to light.   Neck: Normal range of motion. Neck supple. No JVD present. No tracheal deviation present. No thyromegaly present.   Cardiovascular: Normal rate, regular rhythm and normal heart sounds.    Pulmonary/Chest:  Breath sounds normal. No respiratory distress. She has no rales. She exhibits no tenderness.   Abdominal: Soft. She exhibits no distension. There is no tenderness. There is no rebound and no guarding.   Musculoskeletal: Normal range of motion.   Lymphadenopathy:     She has no cervical adenopathy.   Neurological: She is alert and oriented to person, place, and time. She has normal reflexes.   Skin: Skin is warm.   No lesions on feet bilaterally    Psychiatric: She has a normal mood and affect.   Nursing note and vitals reviewed.    Lab Visit on 09/01/2016   Component Date Value    Hgb A1C 09/01/2016 7.3*    HGB A1C,GLUCOSE EST AVG 09/01/2016 163          PLAN -   1. Diabetes mellitus type 2 in obese  -     HEMOGLOBIN A1C; Future  -     MICROALBUMIN; Future  A1C has increased from 6.4 to 7.3 . Patient wants to get back on herd diet and exercise and not add medications at this point . Recheck in 3 months .    Diabetic education is reinforced . Low fat, low cholesterol diabetic diet .Regular exercise as tolerated .  Daily foot exams and annual eye exams .  Monitor blood sugars as discussed . Discussion about  Hypoglycemia and its management . Always carry glucose gels with you .    2.Essential hypertension  HYPERTENSION -Take a low salt diet.Log your blood pressure twice daily and bring  in the log and monitor at the next appoitment.    3. Dyslipidemia  Dyslipidemia, tolerating statins well.Has not developed any muscle aches or pains.  -     CBC WITH DIFFERENTIAL; Future  -     COMPREHENSIVE METABOLIC PANEL; Future  -     LIPID PANEL; Future    4. Polyp of colon, unspecified part of colon, unspecified type  Last colonoscopy done 6/16

## 2016-09-10 NOTE — Patient Instructions (Signed)
Learning About Diabetes Food Guidelines  Your Care Instructions  Meal planning is important to manage diabetes. It helps keep your blood sugar at a target level (which you set with your doctor). You don't have to eat special foods. You can eat what your family eats, including sweets once in a while. But you do have to pay attention to how often you eat and how much you eat of certain foods.  You may want to work with a dietitian or a certified diabetes educator (CDE) to help you plan meals and snacks. A dietitian or CDE can also help you lose weight if that is one of your goals.  What should you know about eating carbs?  Managing the amount of carbohydrate (carbs) you eat is an important part of healthy meals when you have diabetes. Carbohydrate is found in many foods.   Learn which foods have carbs. And learn the amounts of carbs in different foods.   Bread, cereal, pasta, and rice have about 15 grams of carbs in a serving. A serving is 1 slice of bread (1 ounce),  cup of cooked cereal, or 1/3 cup of cooked pasta or rice.   Fruits have 15 grams of carbs in a serving. A serving is 1 small fresh fruit, such as an apple or [place];  of a banana;  cup of cooked or canned fruit;  cup of fruit juice; 1 cup of melon or raspberries; or 2 tablespoons of dried fruit.   Milk and no-sugar-added yogurt have 15 grams of carbs in a serving. A serving is 1 cup of milk or 2/3 cup of no-sugar-added yogurt.   Starchy vegetables have 15 grams of carbs in a serving. A serving is  cup of mashed potatoes or sweet potato; 1 cup winter squash;  of a small baked potato;  cup of cooked beans; or  cup cooked corn or green peas.   Learn how much carbs to eat each day and at each meal. A dietitian or CDE can teach you how to keep track of the amount of carbs you eat. This is called carbohydrate counting.   If you are not sure how to count carbohydrate grams, use the Plate Method to plan meals. It is a good, quick way to make  sure that you have a balanced meal. It also helps you spread carbs throughout the day.   Divide your plate by types of foods. Put non-starchy vegetables on half the plate, meat or other protein food on one-quarter of the plate, and a grain or starchy vegetable in the final quarter of the plate. To this you can add a small piece of fruit and 1 cup of milk or yogurt, depending on how many carbs you are supposed to eat at a meal.   Try to eat about the same amount of carbs at each meal. Do not "save up" your daily allowance of carbs to eat at one meal.   Proteins have very little or no carbs per serving. Examples of proteins are beef, chicken, turkey, fish, eggs, tofu, cheese, cottage cheese, and peanut butter. A serving size of meat is 3 ounces, which is about the size of a deck of cards. Examples of meat substitute serving sizes (equal to 1 ounce of meat) are 1/4 cup of cottage cheese, 1 egg, 1 tablespoon of peanut butter, and  cup of tofu.  How can you eat out and still eat healthy?   Learn to estimate the serving sizes of foods that have   carbohydrate. If you measure food at home, it will be easier to estimate the amount in a serving of restaurant food.   If the meal you order has too much carbohydrate (such as potatoes, corn, or baked beans), ask to have a low-carbohydrate food instead. Ask for a salad or green vegetables.   If you use insulin, check your blood sugar before and after eating out to help you plan how much to eat in the future.   If you eat more carbohydrate at a meal than you had planned, take a walk or do other exercise. This will help lower your blood sugar.  What else should you know?   Limit saturated fat, such as the fat from meat and dairy products. This is a healthy choice because people who have diabetes are at higher risk of heart disease. So choose lean cuts of meat and nonfat or low-fat dairy products. Use olive or canola oil instead of butter or shortening when cooking.   Don't  skip meals. Your blood sugar may drop too low if you skip meals and take insulin or certain medicines for diabetes.   Check with your doctor before you drink alcohol. Alcohol can cause your blood sugar to drop too low. Alcohol can also cause a bad reaction if you take certain diabetes medicines.  Follow-up care is a key part of your treatment and safety. Be sure to make and go to all appointments, and call your doctor if you are having problems. It's also a good idea to know your test results and keep a list of the medicines you take.   Where can you learn more?   Go to https://www.healthwise.net/patiented  Enter I147 in the search box to learn more about "Learning About Diabetes Food Guidelines."    2006-2015 [employer]. Care instructions adapted under license by [facility]. This care instruction is for use with your licensed healthcare professional. If you have questions about a medical condition or this instruction, always ask your healthcare professional. Healthwise, Incorporated disclaims any warranty or liability for your use of this information.  Content Version: 10.6.465758; Current as of: Feb 22, 2014

## 2016-09-13 ENCOUNTER — Other Ambulatory Visit (HOSPITAL_BASED_OUTPATIENT_CLINIC_OR_DEPARTMENT_OTHER): Payer: Self-pay | Admitting: Family Medicine

## 2016-09-13 MED ORDER — SIMVASTATIN 10 MG TABLET: 10.0000 mg | ORAL_TABLET | Freq: Every day | ORAL | 1 refills | Status: DC

## 2016-09-20 ENCOUNTER — Other Ambulatory Visit: Payer: Self-pay | Admitting: Primary Care

## 2016-09-20 DIAGNOSIS — Z1231 Encounter for screening mammogram for malignant neoplasm of breast: Secondary | ICD-10-CM

## 2016-10-05 ENCOUNTER — Ambulatory Visit: Payer: Self-pay

## 2016-10-29 ENCOUNTER — Encounter: Payer: Self-pay | Admitting: Radiology

## 2016-10-29 ENCOUNTER — Ambulatory Visit
Admission: RE | Admit: 2016-10-29 | Discharge: 2016-10-29 | Disposition: A | Discharge status: Home / Self Care | Payer: BLUE CROSS/BLUE SHIELD | Source: Ambulatory Visit | Attending: Primary Care | Admitting: Primary Care

## 2016-10-29 DIAGNOSIS — Z1231 Encounter for screening mammogram for malignant neoplasm of breast: Secondary | ICD-10-CM | POA: Diagnosis present

## 2016-12-08 ENCOUNTER — Ambulatory Visit: Attending: Family Medicine

## 2016-12-08 DIAGNOSIS — E1169 Type 2 diabetes mellitus with other specified complication: Principal | ICD-10-CM

## 2016-12-08 DIAGNOSIS — E785 Hyperlipidemia, unspecified: Secondary | ICD-10-CM

## 2016-12-08 DIAGNOSIS — E669 Obesity, unspecified: Secondary | ICD-10-CM

## 2016-12-08 LAB — CBC WITH DIFFERENTIAL
Basophils % Auto: 0.6 % (ref 0.0–1.0)
Basophils Abs Auto: 0 10*3/uL (ref 0.0–0.1)
Eosinophils % Auto: 4 % (ref 0.0–4.0)
Eosinophils Abs Auto: 0.3 10*3/uL — ABNORMAL HIGH (ref 0.0–0.2)
Hematocrit: 33.9 % — ABNORMAL LOW (ref 36.0–48.0)
Hemoglobin: 11.2 g/dL — ABNORMAL LOW (ref 12.0–16.0)
Immature Granulocytes % Auto: 0.3 % (ref 0.00–0.50)
Immature Granulocytes Abs Auto: 0 10*3/uL (ref 0.0–0.0)
Lymphocytes % Auto: 27 % (ref 5.0–41.0)
Lymphocytes Abs Auto: 1.7 10*3/uL (ref 1.3–2.9)
MCH: 26 pg — ABNORMAL LOW (ref 27.0–34.0)
MCHC g/dL: 33 g/dL (ref 33.0–37.0)
MCV: 78.8 fL — ABNORMAL LOW (ref 82.0–97.0)
MPV: 10.5 fL (ref 9.4–12.4)
Monocytes % Auto: 7.4 % (ref 0.0–10.0)
Monocytes Abs Auto: 0.5 10*3/uL (ref 0.3–0.8)
Neutrophils % Auto: 60.7 % (ref 45.0–75.0)
Neutrophils Abs Auto: 3.79 10*3/uL (ref 2.20–4.80)
Nucleated Cell Count: 0 10*3/uL (ref 0.0–0.1)
Nucleated RBC/100 WBC: 0 % WBC (ref <=0.0)
Platelet Count: 268 10*3/uL (ref 151–365)
RDW: 14.7 % — ABNORMAL HIGH (ref 11.5–14.5)
Red Blood Cell Count: 4.3 10*6/uL (ref 3.80–5.10)
White Blood Cell Count: 6.3 10*3/uL (ref 4.2–10.8)

## 2016-12-08 LAB — COMPREHENSIVE METABOLIC PANEL
Alanine Transferase (ALT): 29 U/L (ref 4–56)
Alb/Glob Ratio: 0.9 — ABNORMAL LOW (ref 1.0–1.6)
Albumin: 3.4 g/dL (ref 3.2–4.7)
Alkaline Phosphatase (ALP): 57 U/L (ref 38–126)
Aspartate Transaminase (AST): 26 U/L (ref 9–44)
BUN/ Creatinine: 34 — ABNORMAL HIGH (ref 7.3–21.7)
Bilirubin Total: 0.5 mg/dL (ref 0.1–2.2)
Calcium: 9 mg/dL (ref 8.7–10.2)
Carbon Dioxide Total: 25 mmol/L (ref 22–32)
Chloride: 105 mmol/L (ref 99–109)
Creatinine Serum: 0.47 mg/dL — ABNORMAL LOW (ref 0.50–1.30)
E-GFR, Non-African American: 140 mL/min/{1.73_m2}
E-GFR: 140 mL/min/{1.73_m2}
Globulin: 3.6 g/dL (ref 2.2–4.2)
Glucose: 110 mg/dL — ABNORMAL HIGH (ref 70–100)
Potassium: 4.2 mmol/L (ref 3.5–5.2)
Protein: 7 g/dL (ref 5.9–8.2)
Sodium: 134 mmol/L (ref 134–143)
Urea Nitrogen, Blood (BUN): 16 mg/dL (ref 6–21)

## 2016-12-08 LAB — MICROALBUMIN
Creatinine Spot Urine: 64.5 mg/dL
Microalbumin Urine: 5 mg/L (ref <=30.0)
Microalbumin/Creatinine Ratio: 8 mg/g (ref <=30)

## 2016-12-08 LAB — LIPID PANEL
Cholesterol: 152 mg/dL (ref 112–200)
HDL Cholesterol: 37 mg/dL — ABNORMAL LOW (ref >=40)
LDL Cholesterol Calculation: 96 mg/dL (ref <100)
Non-HDL Cholesterol: 115 mg/dL (ref <=150.0)
Total Cholesterol: HDL Ratio: 4.1 mg/dL (ref 2.0–5.0)
Triglyceride: 93 mg/dL (ref 30–150)

## 2016-12-08 LAB — HEMOGLOBIN A1C
HGB A1C,GLUCOSE EST AVG: 157 mg/dL
Hgb A1C: 7.1 % — ABNORMAL HIGH (ref 4.0–5.6)

## 2016-12-10 ENCOUNTER — Telehealth (HOSPITAL_BASED_OUTPATIENT_CLINIC_OR_DEPARTMENT_OTHER): Payer: Self-pay | Admitting: Family Medicine

## 2016-12-10 ENCOUNTER — Ambulatory Visit (HOSPITAL_BASED_OUTPATIENT_CLINIC_OR_DEPARTMENT_OTHER): Admitting: Family Medicine

## 2016-12-10 ENCOUNTER — Ambulatory Visit: Attending: Family Medicine

## 2016-12-10 ENCOUNTER — Encounter (HOSPITAL_BASED_OUTPATIENT_CLINIC_OR_DEPARTMENT_OTHER): Payer: Self-pay | Admitting: Family Medicine

## 2016-12-10 VITALS — BP 128/78 | HR 94 | Temp 97.9°F | Resp 17 | Ht 64.0 in | Wt 185.2 lb

## 2016-12-10 DIAGNOSIS — D649 Anemia, unspecified: Principal | ICD-10-CM

## 2016-12-10 DIAGNOSIS — D508 Other iron deficiency anemias: Secondary | ICD-10-CM

## 2016-12-10 DIAGNOSIS — E1169 Type 2 diabetes mellitus with other specified complication: Secondary | ICD-10-CM

## 2016-12-10 DIAGNOSIS — E785 Hyperlipidemia, unspecified: Secondary | ICD-10-CM

## 2016-12-10 DIAGNOSIS — E669 Obesity, unspecified: Secondary | ICD-10-CM

## 2016-12-10 DIAGNOSIS — I10 Essential (primary) hypertension: Secondary | ICD-10-CM

## 2016-12-10 LAB — CBC WITH DIFFERENTIAL
Basophils % Auto: 0.4 % (ref 0.0–1.0)
Basophils Abs Auto: 0 10*3/uL (ref 0.0–0.1)
Eosinophils % Auto: 2.8 % (ref 0.0–4.0)
Eosinophils Abs Auto: 0.2 10*3/uL (ref 0.0–0.2)
Hematocrit: 35.6 % — ABNORMAL LOW (ref 36.0–48.0)
Hemoglobin: 11.6 g/dL — ABNORMAL LOW (ref 12.0–16.0)
Immature Granulocytes % Auto: 0.4 % (ref 0.00–0.50)
Immature Granulocytes Abs Auto: 0 10*3/uL (ref 0.0–0.0)
Lymphocytes % Auto: 23.8 % (ref 5.0–41.0)
Lymphocytes Abs Auto: 2 10*3/uL (ref 1.3–2.9)
MCH: 25.6 pg — ABNORMAL LOW (ref 27.0–34.0)
MCHC g/dL: 32.6 g/dL — ABNORMAL LOW (ref 33.0–37.0)
MCV: 78.6 fL — ABNORMAL LOW (ref 82.0–97.0)
MPV: 10.6 fL (ref 9.4–12.4)
Monocytes % Auto: 6.4 % (ref 0.0–10.0)
Monocytes Abs Auto: 0.5 10*3/uL (ref 0.3–0.8)
Neutrophils % Auto: 66.2 % (ref 45.0–75.0)
Neutrophils Abs Auto: 5.42 10*3/uL — ABNORMAL HIGH (ref 2.20–4.80)
Nucleated Cell Count: 0 10*3/uL (ref 0.0–0.1)
Nucleated RBC/100 WBC: 0 % WBC (ref <=0.0)
Platelet Count: 315 10*3/uL (ref 151–365)
RDW: 15.1 % — ABNORMAL HIGH (ref 11.5–14.5)
Red Blood Cell Count: 4.53 10*6/uL (ref 3.80–5.10)
White Blood Cell Count: 8.2 10*3/uL (ref 4.2–10.8)

## 2016-12-10 LAB — MMC VITAMIN B12 & FOLIC ACID
Folate: >20 ng/mL — ABNORMAL HIGH (ref 5.9–20.0)
Vitamin B12: 547 pg/mL (ref 180–914)

## 2016-12-10 LAB — MMC IRON STUDIES WITH FERRITIN
Ferritin: 9 ng/mL — ABNORMAL LOW (ref 11–307)
Iron Percent Saturation: 6 % — ABNORMAL LOW (ref 11–46)
Iron Total: 29 ug/dL (ref 28–170)
Total Iron Binding Capacity: 457 ug/dL (ref 261–478)
Transferrin: 307 mg/dL (ref 192–382)

## 2016-12-10 NOTE — Progress Notes (Deleted)
Review of Systems   Constitutional: Negative.  Negative for activity change, appetite change, fatigue and fever.   HENT: Negative.  Negative for congestion, ear discharge, ear pain and hearing loss.    Eyes: Negative.    Respiratory: Negative.  Negative for cough, shortness of breath and stridor.    Cardiovascular: Negative.  Negative for chest pain.   Endocrine: Negative for cold intolerance, heat intolerance, polydipsia, polyphagia and polyuria.   Genitourinary: Negative.  Negative for dysuria and frequency.   Skin: Negative.    Neurological: Negative.  Negative for light-headedness and headaches.   Hematological: Negative.    Psychiatric/Behavioral: Negative for behavioral problems, confusion and sleep disturbance.   All other systems reviewed and are negative.    Physical Exam   Constitutional: She is oriented to person, place, and time. She appears well-developed and well-nourished.   HENT:   Head: Normocephalic and atraumatic.   Right Ear: External ear normal.   Left Ear: External ear normal.   Nose: Nose normal.   Mouth/Throat: Oropharynx is clear and moist.   Eyes: Conjunctivae and EOM are normal. Pupils are equal, round, and reactive to light.   Neck: Normal range of motion. Neck supple. No JVD present. No tracheal deviation present. No thyromegaly present.   Cardiovascular: Normal rate, regular rhythm and normal heart sounds.    Pulmonary/Chest: Breath sounds normal. No respiratory distress. She has no rales. She exhibits no tenderness.   Abdominal: Soft. She exhibits no distension. There is no tenderness. There is no rebound and no guarding.   Musculoskeletal: Normal range of motion. She exhibits no edema.   Lymphadenopathy:     She has no cervical adenopathy.   Neurological: She is alert and oriented to person, place, and time.   Skin: Skin is warm.   Monofilament negative bilaterally    Psychiatric: She has a normal mood and affect.   Nursing note and vitals reviewed.

## 2016-12-10 NOTE — Patient Instructions (Signed)
DASH Diet: Care Instructions  Your Care Instructions  The DASH diet is an eating plan that can help lower your blood pressure. DASH stands for Dietary Approaches to Stop Hypertension. Hypertension is high blood pressure.  The DASH diet focuses on eating foods that are high in calcium, potassium, and magnesium. These nutrients can lower blood pressure. The foods that are highest in these nutrients are fruits, vegetables, low-fat dairy products, nuts, seeds, and legumes. But taking calcium, potassium, and magnesium supplements instead of eating foods that are high in those nutrients does not have the same effect. The DASH diet also includes whole grains, fish, and poultry.  The DASH diet is one of several lifestyle changes your doctor may recommend to lower your high blood pressure. Your doctor may also want you to decrease the amount of sodium in your diet. Lowering sodium while following the DASH diet can lower blood pressure even further than just the DASH diet alone.  Follow-up care is a key part of your treatment and safety. Be sure to make and go to all appointments, and call your doctor if you are having problems. It's also a good idea to know your test results and keep a list of the medicines you take.  How can you care for yourself at home?  Following the DASH diet   Eat 4 to 5 servings of fruit each day. A serving is 1 medium-sized piece of fruit,  cup chopped or canned fruit, 1/4 cup dried fruit, or 4 ounces ( cup) of fruit juice. Choose fruit more often than fruit juice.   Eat 4 to 5 servings of vegetables each day. A serving is 1 cup of lettuce or raw leafy vegetables,  cup of chopped or cooked vegetables, or 4 ounces ( cup) of vegetable juice. Choose vegetables more often than vegetable juice.   Get 2 to 3 servings of low-fat and fat-free dairy each day. A serving is 8 ounces of milk, 1 cup of yogurt, or 1  ounces of cheese.   Eat 6 to 8 servings of grains each day. A serving is 1 slice of  bread, 1 ounce of dry cereal, or  cup of cooked rice, pasta, or cooked cereal. Try to choose whole-grain products as much as possible.   Limit lean meat, poultry, and fish to 2 servings each day. A serving is 3 ounces, about the size of a deck of cards.   Eat 4 to 5 servings of nuts, seeds, and legumes (cooked dried beans, lentils, and split peas) each week. A serving is 1/3 cup of nuts, 2 tablespoons of seeds, or  cup of cooked beans or peas.   Limit fats and oils to 2 to 3 servings each day. A serving is 1 teaspoon of vegetable oil or 2 tablespoons of salad dressing.   Limit sweets and added sugars to 5 servings or less a week. A serving is 1 tablespoon jelly or jam,  cup sorbet, or 1 cup of lemonade.   Eat less than 2,300 milligrams (mg) of sodium a day. If you have high blood pressure, diabetes, or chronic kidney disease, if you are African-American, or if you are older than age 50, try to limit the amount of sodium you eat to less than 1,500 mg a day.  Tips for success   Start small. Do not try to make dramatic changes to your diet all at once. You might feel that you are missing out on your favorite foods and then be more likely   to not follow the plan. Make small changes, and stick with them. Once those changes become habit, add a few more changes.   Try some of the following:   Make it a goal to eat a fruit or vegetable at every meal and at snacks. This will make it easy to get the recommended amount of fruits and vegetables each day.   Try yogurt topped with fruit and nuts for a snack or healthy dessert.   Add lettuce, tomato, cucumber, and onion to sandwiches.   Combine a ready-made pizza crust with low-fat mozzarella cheese and lots of vegetable toppings. Try using tomatoes, squash, spinach, broccoli, carrots, cauliflower, and onions.   Have a variety of cut-up vegetables with a low-fat dip as an appetizer instead of chips and dip.   Sprinkle sunflower seeds or chopped almonds over salads.  Or try adding chopped walnuts or almonds to cooked vegetables.   Try some vegetarian meals using beans and peas. Add garbanzo or kidney beans to salads. Make burritos and tacos with mashed pinto beans or black beans.   Where can you learn more?   Go to https://www.healthwise.net/patiented  Enter H967 in the search box to learn more about "DASH Diet: Care Instructions."    2006-2015 [employer], [employer]. Care instructions adapted under license by [facility]. This care instruction is for use with your licensed healthcare professional. If you have questions about a medical condition or this instruction, always ask your healthcare professional. Health[employer], Inc[employer] disclaims any warranty or liability for your use of this information.  Content Version: 10.6.465758; Current as of: November 23, 2013

## 2016-12-10 NOTE — Telephone Encounter (Signed)
Please call DR Eddie Candleummings office - last labs show mild anemia , hemoglobin 11.2 .   Please send a copy of results to him .  HAD COLONOSCOPY DONE 2016 - DOES HE NEED TO SEE HER BACK ?

## 2016-12-13 NOTE — Progress Notes (Signed)
History of Present Illness  A 51 year old female patient presenting to the clinic today for a followup.     1.  DIABETES MELLITUS TYPE 2:  She reports her fasting blood pressures are mostly in 110s to 120s.  She denies any hypoglycemic episodes.  She reports compliance with medications.    2.  HYPERTENSION:  Her blood pressure is stable at home.     3.  DYSLIPIDEMIA:  Tolerating statins well.      4.  ANEMIA:  Recent labs show her hemoglobin at 11.2 with a hematocrit of 33.9, macrocytic hypochromic.      She had a history of anemia in the past in 2016 that was thought to be secondary to heavy/dysfunctional uterine bleeding. She had seen gynecologist, Dr. Jolinda Croak, 07/2016 and had a workup including an ultrasound done.      She had heavy menstrual bleeding over the last year, which had become irregular.  Over the last 3-4 months her bleeding has significantly improved.  She now has bleeding for 2-3 days.       Current Outpatient Prescriptions:     FIBER, PSYLLIUM HUSK, PO,   , Disp: , Rfl:     Lisinopril (PRINIVIL, ZESTRIL) 2.5 mg Tablet, TAKE ONE TABLET BY MOUTH EVERY DAY, Disp: 90 tablet, Rfl: 1    Metformin (GLUCOPHAGE) 1,000 mg tablet, Take 1 tablet by mouth 2 times daily with meals., Disp: 180 tablet, Rfl: 1    MULTIVITAMIN PO, Twice a day, Disp: , Rfl:     MV-MIN/VIT C/GLUT/LYSINE/HC124 ([place], WITH LYSINE ACETATE, PO),   , Disp: , Rfl:     OMEPRAZOLE (PRILOSEC PO), if needed. OTC        , Disp: , Rfl:     Simvastatin (ZOCOR) 10 mg Tablet, Take 1 tablet by mouth every day., Disp: 90 tablet, Rfl: 1       Review of Systems   Constitutional: Negative.  Negative for activity change, appetite change, fatigue and fever.   HENT: Negative.  Negative for congestion, ear discharge, ear pain and hearing loss.    Eyes: Negative.    Respiratory: Negative.  Negative for cough, shortness of breath and stridor.    Cardiovascular: Negative.  Negative for chest pain.   Endocrine: Negative for cold intolerance,  heat intolerance, polydipsia, polyphagia and polyuria.   Genitourinary: Negative.  Negative for dysuria and frequency.   Skin: Negative.    Neurological: Negative.  Negative for light-headedness and headaches.   Hematological: Negative.    Psychiatric/Behavioral: Negative for behavioral problems, confusion and sleep disturbance.   All other systems reviewed and are negative.    Physical Exam   Constitutional: She is oriented to person, place, and time. She appears well-developed and well-nourished.   HENT:   Head: Normocephalic and atraumatic.   Right Ear: External ear normal.   Left Ear: External ear normal.   Nose: Nose normal.   Mouth/Throat: Oropharynx is clear and moist.   Eyes: Conjunctivae and EOM are normal. Pupils are equal, round, and reactive to light.   Neck: Normal range of motion. Neck supple. No JVD present. No tracheal deviation present. No thyromegaly present.   Cardiovascular: Normal rate, regular rhythm and normal heart sounds.    Pulmonary/Chest: Breath sounds normal. No respiratory distress. She has no rales. She exhibits no tenderness.   Abdominal: Soft. She exhibits no distension. There is no tenderness. There is no rebound and no guarding.   Musculoskeletal: Normal range of motion. She exhibits no edema.  Lymphadenopathy:     She has no cervical adenopathy.   Neurological: She is alert and oriented to person, place, and time.   Skin: Skin is warm.   Monofilament negative bilaterally    Psychiatric: She has a normal mood and affect.   Nursing note and vitals reviewed.    Lab Visit on 12/10/2016   Component Date Value    White Blood Cell Count 12/10/2016 8.2     Red Blood Cell Count 12/10/2016 4.53     Hemoglobin 12/10/2016 11.6*    Hematocrit 12/10/2016 35.6*    MCV 12/10/2016 78.6*    MCH 12/10/2016 25.6*    MCHC 12/10/2016 32.6*    RDW 12/10/2016 15.1*    MPV 12/10/2016 10.6     Platelet Count 12/10/2016 315     Neutrophils % Auto 12/10/2016 66.2     Lymphocytes % Auto  12/10/2016 23.8     Monocytes % Auto 12/10/2016 6.4     Eosinophil % Auto 12/10/2016 2.8     Basophils % Auto 12/10/2016 0.4     Immature Granulocytes % 12/10/2016 0.40     Neutrophil Abs Auto 12/10/2016 5.42*    Lymphocyte Abs Auto 12/10/2016 2.0     Monocytes Abs Auto 12/10/2016 0.5     Eosinophil Abs Auto 12/10/2016 0.2     Basophils Abs Auto 12/10/2016 0.0     Nucleated RBC % Auto 12/10/2016 0.0     NRBC Abs Auto 12/10/2016 0.0     Immature Granulocytes Ab* 12/10/2016 0.0     Iron Total 12/10/2016 29     Total Iron Binding Capac* 12/10/2016 457     Iron Percent Saturation 12/10/2016 6*    Transferrin 12/10/2016 307     Ferritin 12/10/2016 9*    Vitamin B12 12/10/2016 547     Folate 12/10/2016 >20.0*   Lab Visit on 12/08/2016   Component Date Value    Hgb A1C 12/08/2016 7.1*    HGB A1C,GLUCOSE EST AVG 12/08/2016 157     Creatinine Spot Urine 12/08/2016 64.5     Microalbumin Urine 12/08/2016 5.0     Microalbumin/Creatinine * 12/08/2016 8     White Blood Cell Count 12/08/2016 6.3     Red Blood Cell Count 12/08/2016 4.30     Hemoglobin 12/08/2016 11.2*    Hematocrit 12/08/2016 33.9*    MCV 12/08/2016 78.8*    MCH 12/08/2016 26.0*    MCHC 12/08/2016 33.0     RDW 12/08/2016 14.7*    MPV 12/08/2016 10.5     Platelet Count 12/08/2016 268     Neutrophils % Auto 12/08/2016 60.7     Lymphocytes % Auto 12/08/2016 27.0     Monocytes % Auto 12/08/2016 7.4     Eosinophil % Auto 12/08/2016 4.0     Basophils % Auto 12/08/2016 0.6     Immature Granulocytes % 12/08/2016 0.30     Neutrophil Abs Auto 12/08/2016 3.79     Lymphocyte Abs Auto 12/08/2016 1.7     Monocytes Abs Auto 12/08/2016 0.5     Eosinophil Abs Auto 12/08/2016 0.3*    Basophils Abs Auto 12/08/2016 0.0     Nucleated RBC % Auto 12/08/2016 0.0     NRBC Abs Auto 12/08/2016 0.0     Immature Granulocytes Ab* 12/08/2016 0.0     Sodium 12/08/2016 134     Potassium 12/08/2016 4.2     Chloride 12/08/2016 105     Carbon  Dioxide Total 12/08/2016 25  Urea Nitrogen, Blood (BU* 12/08/2016 16     Glucose 12/08/2016 110*    Calcium 12/08/2016 9.0     Protein 12/08/2016 7.0     Albumin 12/08/2016 3.4     Alkaline Phosphatase (AL* 12/08/2016 57     Aspartate Transaminase (* 12/08/2016 26     Bilirubin Total 12/08/2016 0.5     Alanine Transferase (ALT) 12/08/2016 29     Creatinine Serum 12/08/2016 0.47*    Globulin 12/08/2016 3.6     Alb/Glob Ratio 12/08/2016 0.9*    BUN/ Creatinine 12/08/2016 34.0*    E-GFR 12/08/2016 140     E-GFR, Non-African Ameri* 12/08/2016 140     Cholesterol 12/08/2016 152     Triglyceride 12/08/2016 93     HDL Cholesterol 12/08/2016 37*    LDL Cholesterol Calculat* 16/10/960403/04/2017 96     Non-HDL Cholesterol 12/08/2016 115.0     Total Cholesterol: HDL R* 12/08/2016 4.1          Assessment and Plan  1.  Diabetes mellitus.   Her hemoglobin A1c has improved from 7.3 to 7.1.  She has successfully lost some weight.  She is not on a regular exercise regimen.      She wants to continue with diet and exercise and not add a second medication at this point.      She understands diabetes is uncontrolled.      Diabetic education is reinforced. Daily foot exams and annual eye exams.      2.  Hypertension, stable.    Low-salt diet.  Log blood pressures at home.     3.  Dyslipidemia.  Cholesterol is in good control.  She has a low HDL of 37.  Regular exercise regimen on a daily basis is recommended.      4.  Anemia.  Review of records shows that her hemoglobin on 10/14/2015 was 13.8 with a hematocrit of 39.5.  Her hemoglobin on 11/22/2014 was 11.6.  She had been taking iron that she had stopped probably around 6 months ago.      We will recheck iron levels. She will be called with the results.  Start on iron as needed.  Recheck labs in 3 months.      She had a colonoscopy done 02/24/2015.  She will be contacting her GI, Dr. Eddie Candleummings, regarding her ongoing anemia.  We will also be sending him a copy of her lab  results and informing him of the same.      5.  Others  Immunizations are reviewed.  She has received influenza vaccination.

## 2016-12-15 ENCOUNTER — Telehealth (HOSPITAL_BASED_OUTPATIENT_CLINIC_OR_DEPARTMENT_OTHER): Payer: Self-pay | Admitting: Family Medicine

## 2016-12-15 DIAGNOSIS — D509 Iron deficiency anemia, unspecified: Secondary | ICD-10-CM

## 2016-12-15 NOTE — Telephone Encounter (Signed)
Spoke with Gearldine BienenstockBrandy at Dr Eddie Candleummings office she stated if patient is having anemia she should be seen. She would like to you to send over new referral.

## 2016-12-15 NOTE — Telephone Encounter (Signed)
Repeat labs in 3 months and follow up with me

## 2016-12-15 NOTE — Telephone Encounter (Signed)
L/m for c/b

## 2016-12-17 ENCOUNTER — Other Ambulatory Visit: Attending: Family Medicine

## 2016-12-17 DIAGNOSIS — D649 Anemia, unspecified: Principal | ICD-10-CM

## 2016-12-17 LAB — MMC FIT: Fecal immunochemical test: NEGATIVE

## 2016-12-17 NOTE — Telephone Encounter (Signed)
Can you send over new referral. GI was requesting one

## 2016-12-17 NOTE — Telephone Encounter (Signed)
done

## 2016-12-17 NOTE — Telephone Encounter (Signed)
Please forward results , DR Vonda Antiguaumming needs to review .  Patient has a history of chronic anemia and had colonoscopy done 5/16

## 2016-12-20 NOTE — Telephone Encounter (Signed)
Informed pt .

## 2017-01-06 ENCOUNTER — Other Ambulatory Visit (HOSPITAL_BASED_OUTPATIENT_CLINIC_OR_DEPARTMENT_OTHER): Payer: Self-pay | Admitting: Family Medicine

## 2017-01-06 MED ORDER — METFORMIN 1,000 MG TABLET
1000.0000 mg | ORAL_TABLET | Freq: Two times a day (BID) | ORAL | 1 refills | Status: DC
Start: 2017-01-06 — End: 2017-09-22

## 2017-01-06 NOTE — Telephone Encounter (Signed)
lov 3.9.18  Nov 6.15.18  3.7.18  Hgb A1C 4.0 - 5.6 % 7.1 (H)

## 2017-03-18 ENCOUNTER — Ambulatory Visit (HOSPITAL_BASED_OUTPATIENT_CLINIC_OR_DEPARTMENT_OTHER): Payer: Self-pay | Admitting: Family Medicine

## 2017-03-28 ENCOUNTER — Ambulatory Visit: Attending: Family Medicine

## 2017-03-28 ENCOUNTER — Telehealth (HOSPITAL_BASED_OUTPATIENT_CLINIC_OR_DEPARTMENT_OTHER): Payer: Self-pay | Admitting: Family Medicine

## 2017-03-28 DIAGNOSIS — E1169 Type 2 diabetes mellitus with other specified complication: Secondary | ICD-10-CM

## 2017-03-28 DIAGNOSIS — E669 Obesity, unspecified: Principal | ICD-10-CM

## 2017-03-28 DIAGNOSIS — D509 Iron deficiency anemia, unspecified: Principal | ICD-10-CM

## 2017-03-28 LAB — CBC WITH DIFFERENTIAL
Basophils % Auto: 0.6 % (ref 0.0–1.0)
Basophils Abs Auto: 0 10*3/uL (ref 0.0–0.1)
Eosinophils % Auto: 4.2 % — ABNORMAL HIGH (ref 0.0–4.0)
Eosinophils Abs Auto: 0.3 10*3/uL — ABNORMAL HIGH (ref 0.0–0.2)
Hematocrit: 36.6 % (ref 36.0–48.0)
Hemoglobin: 12.3 g/dL (ref 12.0–16.0)
Immature Granulocytes % Auto: 0.4 % (ref 0.00–0.50)
Immature Granulocytes Abs Auto: 0 10*3/uL (ref 0.0–0.0)
Lymphocytes % Auto: 27.5 % (ref 5.0–41.0)
Lymphocytes Abs Auto: 2 10*3/uL (ref 1.3–2.9)
MCH: 30.8 pg (ref 27.0–34.0)
MCHC g/dL: 33.6 g/dL (ref 33.0–37.0)
MCV: 91.7 fL (ref 82.0–97.0)
MPV: 10.7 fL (ref 9.4–12.4)
Monocytes % Auto: 7.3 % (ref 0.0–10.0)
Monocytes Abs Auto: 0.5 10*3/uL (ref 0.3–0.8)
Neutrophils % Auto: 60 % (ref 45.0–75.0)
Neutrophils Abs Auto: 4.24 10*3/uL (ref 2.20–4.80)
Nucleated Cell Count: 0 10*3/uL (ref 0.0–0.1)
Nucleated RBC/100 WBC: 0 % WBC (ref <=0.0)
Platelet Count: 262 10*3/uL (ref 151–365)
RDW: 15.2 % — ABNORMAL HIGH (ref 11.5–14.5)
Red Blood Cell Count: 3.99 10*6/uL (ref 3.80–5.10)
White Blood Cell Count: 7.1 10*3/uL (ref 4.2–10.8)

## 2017-03-28 LAB — MMC IRON STUDIES WITH FERRITIN
Ferritin: 28 ng/mL (ref 11–307)
Iron Percent Saturation: 14 % (ref 11–46)
Iron Total: 49 ug/dL (ref 28–170)
Total Iron Binding Capacity: 340 ug/dL (ref 261–478)
Transferrin: 228 mg/dL (ref 192–382)

## 2017-03-28 LAB — HEMOGLOBIN A1C
Hgb A1C,Glucose Est Avg: 134 mg/dL
Hgb A1C: 6.3 % — ABNORMAL HIGH (ref 4.0–5.6)

## 2017-03-28 NOTE — Telephone Encounter (Signed)
Informed Barbara at the lab.

## 2017-03-28 NOTE — Telephone Encounter (Signed)
Pt went in to have labs done this am. There was no order for an A1C- pt states it is time to have it check. Lab stated they collected for A1C just in case they just need and order so they can send it out today. Can you please advise?

## 2017-03-28 NOTE — Telephone Encounter (Signed)
A1C added

## 2017-04-05 ENCOUNTER — Ambulatory Visit (HOSPITAL_BASED_OUTPATIENT_CLINIC_OR_DEPARTMENT_OTHER): Admitting: Family Medicine

## 2017-04-05 ENCOUNTER — Encounter (HOSPITAL_BASED_OUTPATIENT_CLINIC_OR_DEPARTMENT_OTHER): Payer: Self-pay | Admitting: Family Medicine

## 2017-04-05 VITALS — BP 142/80 | HR 89 | Temp 97.2°F | Resp 17 | Ht 64.0 in | Wt 194.4 lb

## 2017-04-05 DIAGNOSIS — I10 Essential (primary) hypertension: Secondary | ICD-10-CM

## 2017-04-05 DIAGNOSIS — E669 Obesity, unspecified: Secondary | ICD-10-CM

## 2017-04-05 DIAGNOSIS — R7989 Other specified abnormal findings of blood chemistry: Secondary | ICD-10-CM

## 2017-04-05 DIAGNOSIS — E1169 Type 2 diabetes mellitus with other specified complication: Secondary | ICD-10-CM

## 2017-04-05 DIAGNOSIS — E785 Hyperlipidemia, unspecified: Secondary | ICD-10-CM

## 2017-04-05 DIAGNOSIS — E559 Vitamin D deficiency, unspecified: Secondary | ICD-10-CM

## 2017-04-05 NOTE — Progress Notes (Signed)
CLINIC OFFICE NOTE  PATIENT NAME:Suliman,        ADMIT DATE: 04/05/2017   Toshika    DOB:1966/03/15              DISCHARGE DATE:   AGE:51                      DATE OF VISIT:04/05/2017      HPI:    This is a 51 year old female patient presenting to the clinic today for followup of:   1. Diabetes mellitus type 2.  She has been working on her diet and exercise over the last few months, has lost some weight.  Her fasting blood sugars are mostly in 110s to 120s.  Has not developed any hypoglycemic episodes.  She feels well.   2. Hypertension.  Reports blood pressure to be stable at home.   3. Hyperlipidemia, with a low HDL.  She is tolerating statins well.   4. History of anemia.  Her recent labs show hemoglobin to be now normal.  Her iron levels have also improved.   5. History of anemia in the past, 2016, thought to be secondary to dysfunctional uterine bleeding.  The workup was negative at that point.  Had a colonoscopy done in 2016.     6. She is currently having some neck and right shoulder pain for which she is working with [employer] physician.    REVIEW OF SYSTEMS:    Denies any cough, runny nose, sneezing.  No fever, chills.  No heat or cold intolerance.  Denies any chest pain, shortness of breath, palpitations.  No nausea, vomiting, abdominal pain.  She is tolerating diet well.  Bowels are regular.  No constipation,   diarrhea, blood in stools or black stools.  No unintentional loss of weight or appetite.  Denies any mood swings.    PHYSICAL EXAMINATION:    GENERAL:  She is alert, oriented, pleasant to talk to.  Oriented in time, place, and person.     VITAL SIGNS:  Stable.     HEENT:  Normocephalic, atraumatic.  Pupils equal, reactive.  Oral mucosa pink and moist.   NECK:  Supple.  No thyroid mass.  No palpable lymph nodes.  JVD not raised.   CHEST:  Bilaterally clear to auscultation.  No additional sounds present.   CARDIOVASCULAR:  S1, S2 regular.  No murmurs.   ABDOMEN:  Soft to touch, nontender.  No  masses.  Obesity present.     EXTREMITIES:  No edema.  Peripheral pulses +2.  On examination of the feet, no lesions noted.  Monofilament negative bilaterally.    TREATMENT:    1. Diabetes mellitus.  Discussion about diet and exercise.  Continue to try losing weight.  Her hemoglobin A1c has improved from 7.1 down to 6.3.  Medications reviewed.  Diabetic education is reinforced.  Daily foot exams and annual eye exams.     2. Dyslipidemia.  Last lipid panel reviewed.  HDL was low at 37.  Discussion about exercise.  Currently tolerating statins well.   3. Immunizations.  Reviewed.  Shingrix recommended.     4. Other.  She is up to date on her mammogram.  Last Pap smear done on 03/08/2016.        Electronically Signed by Alysia Penna. Clayton Lefort, MD on 04/07/2017 17:11:25  Jerney Baksh K. Clayton Lefort, MD      DIOren Beckmann  DD: 04/05/2017 09:02:46           JOB:  [id number]  DT: 04/05/2017 15:50:40  DICT ID [id number]   MT: IB  Tamel Abel Evelene CroonKAUR Areg Bialas K. Clayton Lefortalwar, MD     PATIENT NAMEvlyn Clines: [place],                             Dulcie                              [id number]                            CSN-VISIT ID: [id number]                            DATE: 04/05/2017                            PATIENT ROOM #:                             CLINIC OFFICE NOTE                            Page  2 of 2

## 2017-04-05 NOTE — Patient Instructions (Signed)
Learning About Diabetes Food Guidelines  Your Care Instructions  Meal planning is important to manage diabetes. It helps keep your blood sugar at a target level (which you set with your doctor). You don't have to eat special foods. You can eat what your family eats, including sweets once in a while. But you do have to pay attention to how often you eat and how much you eat of certain foods.  You may want to work with a dietitian or a certified diabetes educator (CDE) to help you plan meals and snacks. A dietitian or CDE can also help you lose weight if that is one of your goals.  What should you know about eating carbs?  Managing the amount of carbohydrate (carbs) you eat is an important part of healthy meals when you have diabetes. Carbohydrate is found in many foods.   Learn which foods have carbs. And learn the amounts of carbs in different foods.   Bread, cereal, pasta, and rice have about 15 grams of carbs in a serving. A serving is 1 slice of bread (1 ounce),  cup of cooked cereal, or 1/3 cup of cooked pasta or rice.   Fruits have 15 grams of carbs in a serving. A serving is 1 small fresh fruit, such as an apple or [place]uc[place];  of a banana;  cup of cooked or canned fruit;  cup of fruit juice; 1 cup of melon or raspberries; or 2 tablespoons of dried fruit.   Milk and no-sugar-added yogurt have 15 grams of carbs in a serving. A serving is 1 cup of milk or 2/3 cup of no-sugar-added yogurt.   Starchy vegetables have 15 grams of carbs in a serving. A serving is  cup of mashed potatoes or sweet potato; 1 cup winter squash;  of a small baked potato;  cup of cooked beans; or  cup cooked corn or green peas.   Learn how much carbs to eat each day and at each meal. A dietitian or CDE can teach you how to keep track of the amount of carbs you eat. This is called carbohydrate counting.   If you are not sure how to count carbohydrate grams, use the Plate Method to plan meals. It is a good, quick way to make  sure that you have a balanced meal. It also helps you spread carbs throughout the day.   Divide your plate by types of foods. Put non-starchy vegetables on half the plate, meat or other protein food on one-quarter of the plate, and a grain or starchy vegetable in the final quarter of the plate. To this you can add a small piece of fruit and 1 cup of milk or yogurt, depending on how many carbs you are supposed to eat at a meal.   Try to eat about the same amount of carbs at each meal. Do not "save up" your daily allowance of carbs to eat at one meal.   Proteins have very little or no carbs per serving. Examples of proteins are beef, chicken, turkey, fish, eggs, tofu, cheese, cottage cheese, and peanut butter. A serving size of meat is 3 ounces, which is about the size of a deck of cards. Examples of meat substitute serving sizes (equal to 1 ounce of meat) are 1/4 cup of cottage cheese, 1 egg, 1 tablespoon of peanut butter, and  cup of tofu.  How can you eat out and still eat healthy?   Learn to estimate the serving sizes of foods that have   carbohydrate. If you measure food at home, it will be easier to estimate the amount in a serving of restaurant food.   If the meal you order has too much carbohydrate (such as potatoes, corn, or baked beans), ask to have a low-carbohydrate food instead. Ask for a salad or green vegetables.   If you use insulin, check your blood sugar before and after eating out to help you plan how much to eat in the future.   If you eat more carbohydrate at a meal than you had planned, take a walk or do other exercise. This will help lower your blood sugar.  What else should you know?   Limit saturated fat, such as the fat from meat and dairy products. This is a healthy choice because people who have diabetes are at higher risk of heart disease. So choose lean cuts of meat and nonfat or low-fat dairy products. Use olive or canola oil instead of butter or shortening when cooking.   Don't  skip meals. Your blood sugar may drop too low if you skip meals and take insulin or certain medicines for diabetes.   Check with your doctor before you drink alcohol. Alcohol can cause your blood sugar to drop too low. Alcohol can also cause a bad reaction if you take certain diabetes medicines.  Follow-up care is a key part of your treatment and safety. Be sure to make and go to all appointments, and call your doctor if you are having problems. It's also a good idea to know your test results and keep a list of the medicines you take.   Where can you learn more?   Go to https://www.healthwise.net/patiented  Enter I147 in the search box to learn more about "Learning About Diabetes Food Guidelines."    2006-2015 [employer]. Care instructions adapted under license by [facility]. This care instruction is for use with your licensed healthcare professional. If you have questions about a medical condition or this instruction, always ask your healthcare professional. Healthwise, Incorporated disclaims any warranty or liability for your use of this information.  Content Version: 10.6.465758; Current as of: Feb 22, 2014

## 2017-06-17 ENCOUNTER — Emergency Department
Admission: EM | Admit: 2017-06-17 | Discharge: 2017-06-17 | Disposition: A | Discharge status: Home / Self Care | Payer: BLUE CROSS/BLUE SHIELD | Attending: Student in an Organized Health Care Education/Training Program | Admitting: Student in an Organized Health Care Education/Training Program

## 2017-06-17 ENCOUNTER — Emergency Department: Payer: BLUE CROSS/BLUE SHIELD

## 2017-06-17 ENCOUNTER — Encounter: Payer: Self-pay | Admitting: Emergency Medicine

## 2017-06-17 DIAGNOSIS — Z79899 Other long term (current) drug therapy: Secondary | ICD-10-CM | POA: Insufficient documentation

## 2017-06-17 DIAGNOSIS — R109 Unspecified abdominal pain: Secondary | ICD-10-CM

## 2017-06-17 DIAGNOSIS — N201 Calculus of ureter: Secondary | ICD-10-CM

## 2017-06-17 DIAGNOSIS — Z87442 Personal history of urinary calculi: Secondary | ICD-10-CM | POA: Diagnosis not present

## 2017-06-17 DIAGNOSIS — R1032 Left lower quadrant pain: Secondary | ICD-10-CM | POA: Diagnosis present

## 2017-06-17 HISTORY — DX: Calculus of kidney: N20.0

## 2017-06-17 LAB — URINALYSIS, COMPLETE (UACMP) WITH MICROSCOPIC
BACTERIA UA: NONE SEEN
BILIRUBIN URINE: NEGATIVE
Glucose, UA: NEGATIVE mg/dL
Ketones, ur: NEGATIVE mg/dL
LEUKOCYTES UA: NEGATIVE
Nitrite: NEGATIVE
Protein, ur: 30 mg/dL — AB
SPECIFIC GRAVITY, URINE: 1.012 (ref 1.005–1.030)
pH: 7 (ref 5.0–8.0)

## 2017-06-17 LAB — COMPREHENSIVE METABOLIC PANEL
ALBUMIN: 3.9 g/dL (ref 3.5–5.0)
ALT: 22 U/L (ref 14–54)
AST: 21 U/L (ref 15–41)
Alkaline Phosphatase: 80 U/L (ref 38–126)
Anion gap: 6 (ref 5–15)
BUN: 16 mg/dL (ref 6–20)
CHLORIDE: 108 mmol/L (ref 101–111)
CO2: 24 mmol/L (ref 22–32)
Calcium: 10.2 mg/dL (ref 8.9–10.3)
Creatinine, Ser: 0.79 mg/dL (ref 0.44–1.00)
GFR calc Af Amer: >60 mL/min (ref >60)
GFR calc non Af Amer: >60 mL/min (ref >60)
GLUCOSE: 107 mg/dL — AB (ref 65–99)
POTASSIUM: 3.4 mmol/L — AB (ref 3.5–5.1)
Sodium: 138 mmol/L (ref 135–145)
Total Bilirubin: 0.8 mg/dL (ref 0.3–1.2)
Total Protein: 6.7 g/dL (ref 6.5–8.1)

## 2017-06-17 LAB — CBC
HEMATOCRIT: 39.3 % (ref 35.0–47.0)
Hemoglobin: 13.8 g/dL (ref 12.0–16.0)
MCH: 32.8 pg (ref 26.0–34.0)
MCHC: 35 g/dL (ref 32.0–36.0)
MCV: 93.8 fL (ref 80.0–100.0)
Platelets: 243 10*3/uL (ref 150–440)
RBC: 4.19 MIL/uL (ref 3.80–5.20)
RDW: 12.9 % (ref 11.5–14.5)
WBC: 5.6 10*3/uL (ref 3.6–11.0)

## 2017-06-17 LAB — LIPASE, BLOOD: LIPASE: 26 U/L (ref 11–51)

## 2017-06-17 MED ORDER — KETOROLAC TROMETHAMINE 30 MG/ML IJ SOLN
15.0000 mg | Freq: Once | INTRAMUSCULAR | Status: AC
  Administered 2017-06-17: 15 mg via INTRAVENOUS
  Filled 2017-06-17: qty 1

## 2017-06-17 MED ORDER — METOCLOPRAMIDE HCL 10 MG PO TABS: 10.0000 mg | ORAL_TABLET | Freq: Four times a day (QID) | ORAL | 0 refills | Status: DC | PRN

## 2017-06-17 MED ORDER — IOPAMIDOL (ISOVUE-300) INJECTION 61%
75.0000 mL | Freq: Once | INTRAVENOUS | Status: AC | PRN
  Administered 2017-06-17: 75 mL via INTRAVENOUS

## 2017-06-17 MED ORDER — MORPHINE SULFATE (PF) 4 MG/ML IV SOLN
4.0000 mg | INTRAVENOUS | Status: DC | PRN
  Administered 2017-06-17: 4 mg via INTRAVENOUS
  Filled 2017-06-17: qty 1

## 2017-06-17 MED ORDER — FENTANYL CITRATE (PF) 100 MCG/2ML IJ SOLN
50.0000 ug | INTRAMUSCULAR | Status: AC | PRN
  Administered 2017-06-17 (×2): 50 ug via INTRAVENOUS
  Filled 2017-06-17 (×2): qty 2

## 2017-06-17 MED ORDER — SODIUM CHLORIDE 0.9 % IV BOLUS (SEPSIS)
1000.0000 mL | Freq: Once | INTRAVENOUS | Status: AC
  Administered 2017-06-17: 1000 mL via INTRAVENOUS

## 2017-06-17 MED ORDER — OXYCODONE-ACETAMINOPHEN 5-325 MG PO TABS: 1.0000 | ORAL_TABLET | Freq: Four times a day (QID) | ORAL | 0 refills | Status: AC | PRN

## 2017-06-17 MED ORDER — PROMETHAZINE HCL 25 MG/ML IJ SOLN
12.5000 mg | Freq: Four times a day (QID) | INTRAMUSCULAR | Status: DC | PRN
  Administered 2017-06-17: 12.5 mg via INTRAVENOUS
  Filled 2017-06-17: qty 1

## 2017-06-17 MED ORDER — OXYCODONE-ACETAMINOPHEN 5-325 MG PO TABS
1.0000 | ORAL_TABLET | Freq: Once | ORAL | Status: AC
  Administered 2017-06-17: 1 via ORAL
  Filled 2017-06-17: qty 1

## 2017-06-17 NOTE — ED Notes (Signed)
Pt returning from CT

## 2017-06-17 NOTE — ED Triage Notes (Signed)
Pt c/o pain to RLQ that radiates across to LLQ and into back.  Hx kidney stones but not sure if feels same. Denies fever, NVD.  Appears in pain.  Skin warm and dry currently. Respirations unlabored. Denies hematuria.

## 2017-06-17 NOTE — ED Notes (Addendum)
Pt began with bilateral lower abdominal and back pain. Pt has hx of kidney stones in the past. Pt stating that she has had no nausea but stating blood in her urine. Pt stating that she had the same pain a week ago but that the pain resolved. Pt stating that today the pain is more intense. Pt has daughters at the bedside. Pt denying changes in BM

## 2017-06-17 NOTE — ED Provider Notes (Signed)
[facility] Emergency Department Provider Note    None    (approximate)  I have reviewed the triage vital signs and the nursing notes.   HISTORY  Chief Complaint Abdominal Pain    HPI Dana Jimenez is a 51 y.o. female history of kidney stones presents with left flank pain and left lower quadrant pain as well as right lower quadrant pain that started late last night associated with nausea. States the pain is intermittent but very severe in maximal intensity. No vomiting. No diarrhea. No dysuria. No measured fevers. No recent antibiotics. Patient is status post hysterectomy. No recent surgeries.   Past Medical History:  Diagnosis Date  . Kidney stone    Family History  Problem Relation Age of Onset  . Breast cancer Maternal Aunt    Past Surgical History:  Procedure Laterality Date  . KNEE SURGERY    . LITHOTRIPSY     There are no active problems to display for this patient.     Prior to Admission medications   Medication Sig Start Date End Date Taking? Authorizing Provider  metFORMIN (GLUCOPHAGE) 1000 MG tablet Take 1,000 mg by mouth 2 (two) times daily. 04/01/17  Yes [provider]  Multiple Vitamin (MULTI-VITAMINS) TABS Take 1 tablet by mouth daily.   Yes [provider]    Allergies Patient has no known allergies.    Social History Social History  Substance Use Topics  . Smoking status: Never Smoker  . Smokeless tobacco: Never Used  . Alcohol use No    Review of Systems Patient denies headaches, rhinorrhea, blurry vision, numbness, shortness of breath, chest pain, edema, cough, abdominal pain, nausea, vomiting, diarrhea, dysuria, fevers, rashes or hallucinations unless otherwise stated above in HPI. ____________________________________________   PHYSICAL EXAM:  VITAL SIGNS: Vitals:   06/17/17 1830 06/17/17 1900  BP: (!) 145/89 111/72  Pulse: 80 83  Resp:  16  Temp:    SpO2: 99% 97%     Constitutional: Alert and oriented. uncomfortable appearing but in no acute distress. Eyes: Conjunctivae are normal.  Head: Atraumatic. Nose: No congestion/rhinnorhea. Mouth/Throat: Mucous membranes are moist.   Neck: No stridor. Painless ROM.  Cardiovascular: Normal rate, regular rhythm. Grossly normal heart sounds.  Good peripheral circulation. Respiratory: Normal respiratory effort.  No retractions. Lungs CTAB. Gastrointestinal: Soft and nontender. No distention. No abdominal bruits. + left CVA tenderness. Musculoskeletal: No lower extremity tenderness nor edema.  No joint effusions. Neurologic:  Normal speech and language. No gross focal neurologic deficits are appreciated. No facial droop Skin:  Skin is warm, dry and intact. No rash noted. Psychiatric: Mood and affect are normal. Speech and behavior are normal.  ____________________________________________   LABS (all labs ordered are listed, but only abnormal results are displayed)  Results for orders placed or performed during the hospital encounter of 06/17/17 (from the past 24 hour(s))  Lipase, blood     Status: None   Collection Time: 06/17/17  3:36 PM  Result Value Ref Range   Lipase 26 11 - 51 U/L  Comprehensive metabolic panel     Status: Abnormal   Collection Time: 06/17/17  3:36 PM  Result Value Ref Range   Sodium 138 135 - 145 mmol/L   Potassium 3.4 (L) 3.5 - 5.1 mmol/L   Chloride 108 101 - 111 mmol/L   CO2 24 22 - 32 mmol/L   Glucose, Bld 107 (H) 65 - 99 mg/dL   BUN 16 6 - 20 mg/dL   Creatinine, Ser  0.79 0.44 - 1.00 mg/dL   Calcium 16.1 8.9 - 09.6 mg/dL   Total Protein 6.7 6.5 - 8.1 g/dL   Albumin 3.9 3.5 - 5.0 g/dL   AST 21 15 - 41 U/L   ALT 22 14 - 54 U/L   Alkaline Phosphatase 80 38 - 126 U/L   Total Bilirubin 0.8 0.3 - 1.2 mg/dL   GFR calc non Af Amer >60 >60 mL/min   GFR calc Af Amer >60 >60 mL/min   Anion gap 6 5 - 15  CBC     Status: None   Collection Time: 06/17/17  3:36 PM  Result Value  Ref Range   WBC 5.6 3.6 - 11.0 K/uL   RBC 4.19 3.80 - 5.20 MIL/uL   Hemoglobin 13.8 12.0 - 16.0 g/dL   HCT 04.5 40.9 - 81.1 %   MCV 93.8 80.0 - 100.0 fL   MCH 32.8 26.0 - 34.0 pg   MCHC 35.0 32.0 - 36.0 g/dL   RDW 91.4 78.2 - 95.6 %   Platelets 243 150 - 440 K/uL  Urinalysis, Complete w Microscopic     Status: Abnormal   Collection Time: 06/17/17  3:36 PM  Result Value Ref Range   Color, Urine YELLOW (A) YELLOW   APPearance CLOUDY (A) CLEAR   Specific Gravity, Urine 1.012 1.005 - 1.030   pH 7.0 5.0 - 8.0   Glucose, UA NEGATIVE NEGATIVE mg/dL   Hgb urine dipstick MODERATE (A) NEGATIVE   Bilirubin Urine NEGATIVE NEGATIVE   Ketones, ur NEGATIVE NEGATIVE mg/dL   Protein, ur 30 (A) NEGATIVE mg/dL   Nitrite NEGATIVE NEGATIVE   Leukocytes, UA NEGATIVE NEGATIVE   RBC / HPF TOO NUMEROUS TO COUNT 0 - 5 RBC/hpf   WBC, UA 0-5 0 - 5 WBC/hpf   Bacteria, UA NONE SEEN NONE SEEN   Squamous Epithelial / LPF 0-5 (A) NONE SEEN   Mucus PRESENT    ____________________________________________  EKG____________________________________________  RADIOLOGY  I personally reviewed all radiographic images ordered to evaluate for the above acute complaints and reviewed radiology reports and findings.  These findings were personally discussed with the patient.  Please see medical record for radiology report.  ____________________________________________   PROCEDURES  Procedure(s) performed:  Procedures    Critical Care performed: no ____________________________________________   INITIAL IMPRESSION / ASSESSMENT AND PLAN / ED COURSE  Pertinent labs & imaging results that were available during my care of the patient were reviewed by me and considered in my medical decision making (see chart for details).  DDX: kidney stones, pyelo, appy, diverticulitis, enteritis  Dana Jimenez is a 51 y.o. who presents to the ED with Hx of stones p/w acute right flank pain. No fevers, no systemic symptoms. -  urinary symptoms. Denies trauma or injury. Afebrile in ED. Exam as above. Flank TTP, otherwise abdominal exam is benign. No peritoneal signs. Possible kidney stone, cystitis, or pyelonephritis.   Checking urine. UA with hematuria, no infection CT 7mm uvj stone with hydro Clinical picture is not consistent with appendicitis, diverticulitis, pancreatitis, cholecystitis, bowel perforation, aortic dissection, splenic injury or acute abdominal process at this time.    Clinical Course as of Jun 18 1931  Fri Jun 17, 2017  1925 patient reassessed and is currently pain-free.  Discussed case with Dr. Apolinar Junes who agrees to follow patient in clinic on Monday or Tuesday for further outpatient workup.  [PR]    Clinical Course User Index [PR] Willy Eddy, MD    Pain improved, tolerating PO.  Repeat ABD exam benign, will plan supportive treatment and early follow up for recheck.  ____________________________________________   FINAL CLINICAL IMPRESSION(S) / ED DIAGNOSES  Final diagnoses:  Ureterolithiasis  Acute flank pain      NEW MEDICATIONS STARTED DURING THIS VISIT:  New Prescriptions   No medications on file     Note:  This document was prepared using Dragon voice recognition software and may include unintentional dictation errors.    Willy Eddy, MD 06/17/17 [phone]

## 2017-06-17 NOTE — ED Notes (Signed)
Pt given crackers and water per MD verbal order for PO challenge. Pt able top sit up and eat independently.

## 2017-06-17 NOTE — ED Notes (Addendum)
This RN informed by other staff pt requesting more pain medication. Pt appears more comfortable, resting with eyes closed, no longer tearful or moaning.  Explained needs to see EDP prior to receiving more IV medication for pain. Verbalized understanding.

## 2017-06-20 ENCOUNTER — Ambulatory Visit (INDEPENDENT_AMBULATORY_CARE_PROVIDER_SITE_OTHER): Payer: BLUE CROSS/BLUE SHIELD | Admitting: Urology

## 2017-06-20 ENCOUNTER — Encounter: Payer: Self-pay | Admitting: Urology

## 2017-06-20 ENCOUNTER — Other Ambulatory Visit: Payer: Self-pay | Admitting: Radiology

## 2017-06-20 DIAGNOSIS — R310 Gross hematuria: Secondary | ICD-10-CM

## 2017-06-20 DIAGNOSIS — Z87442 Personal history of urinary calculi: Secondary | ICD-10-CM | POA: Diagnosis not present

## 2017-06-20 DIAGNOSIS — N132 Hydronephrosis with renal and ureteral calculous obstruction: Secondary | ICD-10-CM

## 2017-06-20 DIAGNOSIS — N201 Calculus of ureter: Secondary | ICD-10-CM

## 2017-06-20 LAB — URINALYSIS, COMPLETE
BILIRUBIN UA: NEGATIVE
Glucose, UA: NEGATIVE
Ketones, UA: NEGATIVE
LEUKOCYTES UA: NEGATIVE
Nitrite, UA: NEGATIVE
PH UA: 6 (ref 5.0–7.5)
PROTEIN UA: NEGATIVE
Specific Gravity, UA: 1.01 (ref 1.005–1.030)
Urobilinogen, Ur: 0.2 mg/dL (ref 0.2–1.0)

## 2017-06-20 LAB — MICROSCOPIC EXAMINATION
BACTERIA UA: NONE SEEN
RBC, UA: NONE SEEN /hpf (ref 0–?)
WBC, UA: NONE SEEN /hpf (ref 0–?)

## 2017-06-20 NOTE — Progress Notes (Signed)
06/20/2017 3:06 PM   Dana Jimenez Dec 16, 1965 [id number]  Referring provider: Center, [address] [place], [place] [address]  Chief Complaint  Patient presents with  . Nephrolithiasis    New Patient    HPI: Patient is a 51 year old Hispanic female with a history of nephrolithiasis who presents today after she was seen at [facility] emergency room for an obstructing right UVJ stone.    Patient states the onset of the pain was two weeks ago.   It was sharp and intermittent.  It lasted for off and on for several minutes.  The pain was located right lower qadrant and radiated to right and left flank.    The pain was a 10/10.  Nothing made the pain better.   Nothing made the pain worse.  She did have gross hematuria, but she had no fevers, chills, nausea or vomiting.    Contrast CT noted right-sided obstructive uropathy secondary to 7 mm distal ureteral/UVJ calculus.  Her creatinine is 0.79.  Her WBC count was 10.1.  UA was positive for TNTC RBC's.    Today, she is having right lower quadrant pain.  It is radiating into her right flank.  It is colicky.  It is intense, but she controls it with oxycodone/APAP.  UA today is negative.  She is having associated nocturia, incontinence and intermittency.    She does have a prior history of stones treated by Dr. Bernardo Heater in the past.       PMH: Past Medical History:  Diagnosis Date  . Kidney stone     Surgical History: Past Surgical History:  Procedure Laterality Date  . KNEE SURGERY    . LITHOTRIPSY      Home Medications:  Allergies as of 06/20/2017   No Known Allergies     Medication List       Accurate as of 06/20/17  3:06 PM. Always use your most recent med list.          metFORMIN 1000 MG tablet Commonly known as:  GLUCOPHAGE Take 1,000 mg by mouth 2 (two) times daily.   metoCLOPramide 10 MG tablet Commonly known as:  REGLAN Take 1 tablet (10 mg total) by mouth every 6  (six) hours as needed for nausea.   MULTI-VITAMINS Tabs Take 1 tablet by mouth daily.   oxyCODONE-acetaminophen 5-325 MG tablet Commonly known as:  ROXICET Take 1 tablet by mouth every 6 (six) hours as needed.            Discharge Care Instructions        Start     Ordered   06/20/17 0000  Urinalysis, Complete     06/20/17 1403   06/20/17 0000  CULTURE, URINE COMPREHENSIVE     06/20/17 1403      Allergies: No Known Allergies  Family History: Family History  Problem Relation Age of Onset  . Breast cancer Maternal Aunt     Social History:  reports that she has never smoked. She has never used smokeless tobacco. She reports that she does not drink alcohol or use drugs.  ROS: UROLOGY Frequent Urination?: No Hard to postpone urination?: No Burning/pain with urination?: No Get up at night to urinate?: Yes Leakage of urine?: Yes Urine stream starts and stops?: Yes Trouble starting stream?: No Do you have to strain to urinate?: No Blood in urine?: Yes Urinary tract infection?: Yes Sexually transmitted disease?: No Injury to kidneys or bladder?: No Painful intercourse?: No Weak  stream?: No Currently pregnant?: No Vaginal bleeding?: No Last menstrual period?: n  Gastrointestinal Nausea?: No Vomiting?: No Indigestion/heartburn?: No Diarrhea?: No Constipation?: Yes  Constitutional Fever: No Night sweats?: No Weight loss?: No Fatigue?: No  Skin Skin rash/lesions?: No Itching?: No  Eyes Blurred vision?: No Double vision?: No  Ears/Nose/Throat Sore throat?: No Sinus problems?: Yes  Hematologic/Lymphatic Swollen glands?: No Easy bruising?: No  Cardiovascular Leg swelling?: No Chest pain?: No  Respiratory Cough?: No Shortness of breath?: No  Endocrine Excessive thirst?: No  Musculoskeletal Back pain?: No Joint pain?: No  Neurological Headaches?: Yes Dizziness?: No  Psychologic Depression?: No Anxiety?: No  Physical Exam: BP  (!) 150/77   Pulse 91   Ht 5\' 2"  (1.575 m)   Wt 122 lb (55.3 kg)   BMI 22.31 kg/m   Constitutional: Well nourished. Alert and oriented, No acute distress. HEENT:  AT, moist mucus membranes. Trachea midline, no masses. Cardiovascular: No clubbing, cyanosis, or edema. Respiratory: Normal respiratory effort, no increased work of breathing. GI: Abdomen is soft, non tender, non distended, no abdominal masses.   GU: No CVA tenderness.  No bladder fullness or masses.   Skin: No rashes, bruises or suspicious lesions. Lymph: No cervical or inguinal adenopathy. Neurologic: Grossly intact, no focal deficits, moving all 4 extremities. Psychiatric: Normal mood and affect.  Laboratory Data: Lab Results  Component Value Date   WBC 5.6 06/17/2017   HGB 13.8 06/17/2017   HCT 39.3 06/17/2017   MCV 93.8 06/17/2017   PLT 243 06/17/2017    Lab Results  Component Value Date   CREATININE 0.79 06/17/2017    Lab Results  Component Value Date   AST 21 06/17/2017   Lab Results  Component Value Date   ALT 22 06/17/2017    Urinalysis Negative.  See EPIC.    I have reviewed the labs.   Pertinent Imaging: CLINICAL DATA:  Abdominal pain.  Suspect diverticulitis.  EXAM: CT ABDOMEN AND PELVIS WITH CONTRAST  TECHNIQUE: Multidetector CT imaging of the abdomen and pelvis was performed using the standard protocol following bolus administration of intravenous contrast.  CONTRAST:  20mL ISOVUE-300 IOPAMIDOL (ISOVUE-300) INJECTION 61%  COMPARISON:  12/02/2012  FINDINGS: Lower chest: The lung bases are clear.  Hepatobiliary: No focal liver abnormality is seen. Status post cholecystectomy. No biliary dilatation.  Pancreas: The pancreas is unremarkable.  Spleen: Normal appearance of the spleen.  Adrenals/Urinary Tract: The adrenal glands are normal. Unremarkable appearance of the left kidney. Right-sided nephromegaly, hydronephrosis and perinephric edema/fluid identified  stone at the right UVJ measures 7 mm, image 75 of series 2. Urinary bladder appears normal.  Stomach/Bowel: The stomach appears normal. The small bowel loops have a normal course and caliber. No bowel obstruction. Unremarkable appearance of the colon.  Vascular/Lymphatic: Normal appearance of the abdominal aorta. No enlarged retroperitoneal or mesenteric adenopathy. No enlarged pelvic or inguinal lymph nodes.  Reproductive: Status post hysterectomy. No adnexal masses.  Other: No abdominal wall hernia or abnormality. No abdominopelvic ascites.  Musculoskeletal: No acute or significant osseous findings.  IMPRESSION: 1. Right-sided obstructive uropathy secondary to 7 mm distal ureteral/UVJ calculus.   Electronically Signed   By: 02/01/2013 M.D.   On: 06/17/2017 18:13 I have independently reviewed the films.    Assessment & Plan:   1. Right ureteral stone  - explained to the patient that AUA Guidelines for patients with uncomplicated ureteral stones ?10 mm should be offered observation, and those with distal stones of similar size should be offered MET  with ?-blockers  - if after 4 to 6 weeks observation with or without MET is not successful we would pursue URS or ESWL, if the patient/clinician decide to intervene sooner based on a shared decision making approach, the clinicians should offer definitive stone treatment  -URS would be the first lined therapy if stone(s) do not pass, but ESWL is the procedure with the least morbidity and lowest complication rate, but URS has a greater stone-free rate in a single procedure  - patient prefers ESWL as she has had URS with stent placement in the past - I did advise her that ESWL did not preclude the possibility of a stent  - I explained that ESWL is a means of pulverizing urinary stones without surgery using shockwave therapy  - I discussed the risks involved with ESWL consist of bruising to the skin and kidney region as a  result of the shockwave, possibility of long-term kidney damage, development of high blood pressure and damage to the bowel or long, hematuria, urinary bleeding serious enough to require transfusion or surgical repair or removal of the kidney is rare, rare chance of hematoma formation in the kidney and injuries to the spleen, liver or pancreas.  There is also the risk of urinary tract infection or any infection of the blood system or tissue.   There is also a possibility of resultant damage to female organs.  - I explained that sometimes the stone fragments can stack up in the ureter like coins, a phenomenon described as "Steinstrasse", that would result in a stent placement and/or URS for further treatment  - I informed the patient that IV sedation is typically used on the truck, but it some rare instances we need to use general anesthesia - the risks being infection, irregular heart beat, irregular BP, stroke, MI, CVA, paralysis, coma and/or death.   2. Right hydronephrosis  - obtain RUS to ensure the hydronephrosis has resolved once stone has passed/treated  3. Gross hematuria  - UA today demonstrates no hematuria.    - continue to monitor the patient's UA after the treatment/passage of the stone to ensure the hematuria has resolved  - if hematuria persists, we will pursue a hematuria workup with CT Urogram and cystoscopy if appropriate.  4. History of nephrolithiasis  - patient underwent URS for a 1.8 cm right UPJ stone in 2014   Return for right ESWL.  These notes generated with voice recognition software. I apologize for typographical errors.  Zara Council, [facility] [address], [place], [place] [address] [phone]

## 2017-06-21 ENCOUNTER — Encounter: Payer: Self-pay | Admitting: *Deleted

## 2017-06-23 ENCOUNTER — Telehealth: Payer: Self-pay | Admitting: Urology

## 2017-06-23 ENCOUNTER — Ambulatory Visit: Payer: BLUE CROSS/BLUE SHIELD

## 2017-06-23 ENCOUNTER — Encounter
Admission: RE | Disposition: A | Discharge status: Home / Self Care | Payer: Self-pay | Source: Ambulatory Visit | Attending: Urology

## 2017-06-23 ENCOUNTER — Encounter: Payer: Self-pay | Admitting: *Deleted

## 2017-06-23 ENCOUNTER — Ambulatory Visit
Admission: RE | Admit: 2017-06-23 | Discharge: 2017-06-23 | Disposition: A | Discharge status: Home / Self Care | Payer: BLUE CROSS/BLUE SHIELD | Source: Ambulatory Visit | Attending: Urology | Admitting: Urology

## 2017-06-23 DIAGNOSIS — Z87442 Personal history of urinary calculi: Secondary | ICD-10-CM | POA: Insufficient documentation

## 2017-06-23 DIAGNOSIS — Z538 Procedure and treatment not carried out for other reasons: Secondary | ICD-10-CM | POA: Diagnosis not present

## 2017-06-23 DIAGNOSIS — N201 Calculus of ureter: Secondary | ICD-10-CM

## 2017-06-23 DIAGNOSIS — R310 Gross hematuria: Secondary | ICD-10-CM | POA: Diagnosis not present

## 2017-06-23 DIAGNOSIS — Z7984 Long term (current) use of oral hypoglycemic drugs: Secondary | ICD-10-CM | POA: Insufficient documentation

## 2017-06-23 DIAGNOSIS — Z79899 Other long term (current) drug therapy: Secondary | ICD-10-CM | POA: Insufficient documentation

## 2017-06-23 DIAGNOSIS — N132 Hydronephrosis with renal and ureteral calculous obstruction: Secondary | ICD-10-CM | POA: Diagnosis present

## 2017-06-23 HISTORY — DX: Personal history of urinary calculi: Z87.442

## 2017-06-23 HISTORY — PX: EXTRACORPOREAL SHOCK WAVE LITHOTRIPSY: SHX1557

## 2017-06-23 LAB — CULTURE, URINE COMPREHENSIVE

## 2017-06-23 SURGERY — LITHOTRIPSY, ESWL
Anesthesia: Moderate Sedation | Laterality: Right

## 2017-06-23 MED ORDER — ONDANSETRON HCL 4 MG/2ML IJ SOLN
INTRAMUSCULAR | Status: AC
  Administered 2017-06-23: 4 mg via INTRAVENOUS
  Filled 2017-06-23: qty 2

## 2017-06-23 MED ORDER — SODIUM CHLORIDE 0.9 % IV SOLN
INTRAVENOUS | Status: DC
  Administered 2017-06-23: 08:00:00 via INTRAVENOUS

## 2017-06-23 MED ORDER — CIPROFLOXACIN HCL 500 MG PO TABS
ORAL_TABLET | ORAL | Status: AC
  Administered 2017-06-23: 500 mg via ORAL
  Filled 2017-06-23: qty 1

## 2017-06-23 MED ORDER — DIAZEPAM 5 MG PO TABS
10.0000 mg | ORAL_TABLET | ORAL | Status: AC
  Administered 2017-06-23: 10 mg via ORAL

## 2017-06-23 MED ORDER — DIAZEPAM 5 MG PO TABS
ORAL_TABLET | ORAL | Status: AC
  Administered 2017-06-23: 10 mg via ORAL
  Filled 2017-06-23: qty 2

## 2017-06-23 MED ORDER — DIPHENHYDRAMINE HCL 25 MG PO CAPS
ORAL_CAPSULE | ORAL | Status: AC
  Administered 2017-06-23: 25 mg via ORAL
  Filled 2017-06-23: qty 1

## 2017-06-23 MED ORDER — ONDANSETRON HCL 4 MG/2ML IJ SOLN
4.0000 mg | Freq: Once | INTRAMUSCULAR | Status: AC
  Administered 2017-06-23: 4 mg via INTRAVENOUS

## 2017-06-23 MED ORDER — DIPHENHYDRAMINE HCL 25 MG PO CAPS
25.0000 mg | ORAL_CAPSULE | ORAL | Status: AC
  Administered 2017-06-23: 25 mg via ORAL

## 2017-06-23 MED ORDER — CIPROFLOXACIN HCL 500 MG PO TABS
500.0000 mg | ORAL_TABLET | ORAL | Status: AC
  Administered 2017-06-23: 500 mg via ORAL

## 2017-06-23 NOTE — Telephone Encounter (Signed)
Stone not seen on KUB as well as under fluoroscopy on lithotripsy truck.  As such, procedure was cancelled.    Patient last reports pain last night, mild in right flank, but did not see stone pass.    Recommend f/u in 2 weeks with RUS.  Vanna Scotland, MD

## 2017-06-23 NOTE — H&P (View-Only) (Signed)
06/20/2017 3:06 PM   Dana Jimenez [birth year] [id number]  Referring provider: Center, [facility] [address] [place], [place] [address]  Chief Complaint  Patient presents with  . Nephrolithiasis    New Patient    HPI: Patient is a 51 year old Hispanic female with a history of nephrolithiasis who presents today after she was seen at [facility] emergency room for an obstructing right UVJ stone.    Patient states the onset of the pain was two weeks ago.   It was sharp and intermittent.  It lasted for off and on for several minutes.  The pain was located right lower qadrant and radiated to right and left flank.    The pain was a 10/10.  Nothing made the pain better.   Nothing made the pain worse.  She did have gross hematuria, but she had no fevers, chills, nausea or vomiting.    Contrast CT noted right-sided obstructive uropathy secondary to 7 mm distal ureteral/UVJ calculus.  Her creatinine is 0.79.  Her WBC count was 10.1.  UA was positive for TNTC RBC's.    Today, she is having right lower quadrant pain.  It is radiating into her right flank.  It is colicky.  It is intense, but she controls it with oxycodone/APAP.  UA today is negative.  She is having associated nocturia, incontinence and intermittency.    She does have a prior history of stones treated by Dr. Lonna Cobb in the past.       PMH: Past Medical History:  Diagnosis Date  . Kidney stone     Surgical History: Past Surgical History:  Procedure Laterality Date  . KNEE SURGERY    . LITHOTRIPSY      Home Medications:  Allergies as of 06/20/2017   No Known Allergies     Medication List       Accurate as of 06/20/17  3:06 PM. Always use your most recent med list.          metFORMIN 1000 MG tablet Commonly known as:  GLUCOPHAGE Take 1,000 mg by mouth 2 (two) times daily.   metoCLOPramide 10 MG tablet Commonly known as:  REGLAN Take 1 tablet (10 mg total) by mouth every 6  (six) hours as needed for nausea.   MULTI-VITAMINS Tabs Take 1 tablet by mouth daily.   oxyCODONE-acetaminophen 5-325 MG tablet Commonly known as:  ROXICET Take 1 tablet by mouth every 6 (six) hours as needed.            Discharge Care Instructions        Start     Ordered   06/20/17 0000  Urinalysis, Complete     06/20/17 1403   06/20/17 0000  CULTURE, URINE COMPREHENSIVE     06/20/17 1403      Allergies: No Known Allergies  Family History: Family History  Problem Relation Age of Onset  . Breast cancer Maternal Aunt     Social History:  reports that she has never smoked. She has never used smokeless tobacco. She reports that she does not drink alcohol or use drugs.  ROS: UROLOGY Frequent Urination?: No Hard to postpone urination?: No Burning/pain with urination?: No Get up at night to urinate?: Yes Leakage of urine?: Yes Urine stream starts and stops?: Yes Trouble starting stream?: No Do you have to strain to urinate?: No Blood in urine?: Yes Urinary tract infection?: Yes Sexually transmitted disease?: No Injury to kidneys or bladder?: No Painful intercourse?: No Weak  stream?: No Currently pregnant?: No Vaginal bleeding?: No Last menstrual period?: n  Gastrointestinal Nausea?: No Vomiting?: No Indigestion/heartburn?: No Diarrhea?: No Constipation?: Yes  Constitutional Fever: No Night sweats?: No Weight loss?: No Fatigue?: No  Skin Skin rash/lesions?: No Itching?: No  Eyes Blurred vision?: No Double vision?: No  Ears/Nose/Throat Sore throat?: No Sinus problems?: Yes  Hematologic/Lymphatic Swollen glands?: No Easy bruising?: No  Cardiovascular Leg swelling?: No Chest pain?: No  Respiratory Cough?: No Shortness of breath?: No  Endocrine Excessive thirst?: No  Musculoskeletal Back pain?: No Joint pain?: No  Neurological Headaches?: Yes Dizziness?: No  Psychologic Depression?: No Anxiety?: No  Physical Exam: BP  (!) 150/77   Pulse 91   Ht '5\' 2"'$  (1.575 m)   Wt 122 lb (55.3 kg)   BMI 22.31 kg/m   Constitutional: Well nourished. Alert and oriented, No acute distress. HEENT: [place] AT, moist mucus membranes. Trachea midline, no masses. Cardiovascular: No clubbing, cyanosis, or edema. Respiratory: Normal respiratory effort, no increased work of breathing. GI: Abdomen is soft, non tender, non distended, no abdominal masses.   GU: No CVA tenderness.  No bladder fullness or masses.   Skin: No rashes, bruises or suspicious lesions. Lymph: No cervical or inguinal adenopathy. Neurologic: Grossly intact, no focal deficits, moving all 4 extremities. Psychiatric: Normal mood and affect.  Laboratory Data: Lab Results  Component Value Date   WBC 5.6 06/17/2017   HGB 13.8 06/17/2017   HCT 39.3 06/17/2017   MCV 93.8 06/17/2017   PLT 243 06/17/2017    Lab Results  Component Value Date   CREATININE 0.79 06/17/2017    Lab Results  Component Value Date   AST 21 06/17/2017   Lab Results  Component Value Date   ALT 22 06/17/2017    Urinalysis Negative.  See EPIC.    I have reviewed the labs.   Pertinent Imaging: CLINICAL DATA:  Abdominal pain.  Suspect diverticulitis.  EXAM: CT ABDOMEN AND PELVIS WITH CONTRAST  TECHNIQUE: Multidetector CT imaging of the abdomen and pelvis was performed using the standard protocol following bolus administration of intravenous contrast.  CONTRAST:  76m ISOVUE-300 IOPAMIDOL (ISOVUE-300) INJECTION 61%  COMPARISON:  12/02/2012  FINDINGS: Lower chest: The lung bases are clear.  Hepatobiliary: No focal liver abnormality is seen. Status post cholecystectomy. No biliary dilatation.  Pancreas: The pancreas is unremarkable.  Spleen: Normal appearance of the spleen.  Adrenals/Urinary Tract: The adrenal glands are normal. Unremarkable appearance of the left kidney. Right-sided nephromegaly, hydronephrosis and perinephric edema/fluid identified  stone at the right UVJ measures 7 mm, image 75 of series 2. Urinary bladder appears normal.  Stomach/Bowel: The stomach appears normal. The small bowel loops have a normal course and caliber. No bowel obstruction. Unremarkable appearance of the colon.  Vascular/Lymphatic: Normal appearance of the abdominal aorta. No enlarged retroperitoneal or mesenteric adenopathy. No enlarged pelvic or inguinal lymph nodes.  Reproductive: Status post hysterectomy. No adnexal masses.  Other: No abdominal wall hernia or abnormality. No abdominopelvic ascites.  Musculoskeletal: No acute or significant osseous findings.  IMPRESSION: 1. Right-sided obstructive uropathy secondary to 7 mm distal ureteral/UVJ calculus.   Electronically Signed   By: TKerby MoorsM.D.   On: 06/17/2017 18:13 I have independently reviewed the films.    Assessment & Plan:   1. Right ureteral stone  - explained to the patient that AUA Guidelines for patients with uncomplicated ureteral stones ?10 mm should be offered observation, and those with distal stones of similar size should be offered MET  with ?-blockers  - if after 4 to 6 weeks observation with or without MET is not successful we would pursue URS or ESWL, if the patient/clinician decide to intervene sooner based on a shared decision making approach, the clinicians should offer definitive stone treatment  -URS would be the first lined therapy if stone(s) do not pass, but ESWL is the procedure with the least morbidity and lowest complication rate, but URS has a greater stone-free rate in a single procedure  - patient prefers ESWL as she has had URS with stent placement in the past - I did advise her that ESWL did not preclude the possibility of a stent  - I explained that ESWL is a means of pulverizing urinary stones without surgery using shockwave therapy  - I discussed the risks involved with ESWL consist of bruising to the skin and kidney region as a  result of the shockwave, possibility of long-term kidney damage, development of high blood pressure and damage to the bowel or long, hematuria, urinary bleeding serious enough to require transfusion or surgical repair or removal of the kidney is rare, rare chance of hematoma formation in the kidney and injuries to the spleen, liver or pancreas.  There is also the risk of urinary tract infection or any infection of the blood system or tissue.   There is also a possibility of resultant damage to female organs.  - I explained that sometimes the stone fragments can stack up in the ureter like coins, a phenomenon described as "Steinstrasse", that would result in a stent placement and/or URS for further treatment  - I informed the patient that IV sedation is typically used on the truck, but it some rare instances we need to use general anesthesia - the risks being infection, irregular heart beat, irregular BP, stroke, MI, CVA, paralysis, coma and/or death.   2. Right hydronephrosis  - obtain RUS to ensure the hydronephrosis has resolved once stone has passed/treated  3. Gross hematuria  - UA today demonstrates no hematuria.    - continue to monitor the patient's UA after the treatment/passage of the stone to ensure the hematuria has resolved  - if hematuria persists, we will pursue a hematuria workup with CT Urogram and cystoscopy if appropriate.  4. History of nephrolithiasis  - patient underwent URS for a 1.8 cm right UPJ stone in 2014   Return for right ESWL.  These notes generated with voice recognition software. I apologize for typographical errors.  Zara Council, [facility] [address], [place], [place] [address] [phone]

## 2017-06-23 NOTE — Telephone Encounter (Signed)
Called patient to give her the appointment information and let her know about the RUS but she did not have VM. I will try her again.  Dana Jimenez

## 2017-06-23 NOTE — Interval H&P Note (Signed)
History and Physical Interval Note:  06/23/2017 9:54 AM  Dana Jimenez  has presented today for surgery, with the diagnosis of Kidney stone  The various methods of treatment have been discussed with the patient and family. After consideration of risks, benefits and other options for treatment, the patient has consented to  Procedure(s): EXTRACORPOREAL SHOCK WAVE LITHOTRIPSY (ESWL) (Right) as a surgical intervention .  The patient's history has been reviewed, patient examined, no change in status, stable for surgery.  I have reviewed the patient's chart and labs.  Questions were answered to the patient's satisfaction.     Vanna Scotland

## 2017-07-06 NOTE — Progress Notes (Deleted)
07/07/2017 12:17 AM   Dana Jimenez [birth year] [id number]  Referring provider: [facility] [address] [place], [place] [address]  No chief complaint on file.   HPI: 51 yo HF with a history of nephrolithiasis who presents today for RUS report.  Background history Patient is a 51 year old Hispanic female with a history of nephrolithiasis who presents today after she was seen at [facility] emergency room for an obstructing right UVJ stone.  Patient states the onset of the pain was two weeks ago.   It was sharp and intermittent.  It lasted for off and on for several minutes.  The pain was located right lower qadrant and radiated to right and left flank.   The pain was a 10/10.  Nothing made the pain better.   Nothing made the pain worse.  She did have gross hematuria, but she had no fevers, chills, nausea or vomiting.  Contrast CT noted right-sided obstructive uropathy secondary to 7 mm distal ureteral/UVJ calculus.  Her creatinine is 0.79.  Her WBC count was 10.1.  UA was positive for TNTC RBC's.    At her initial visit with [place], she was having right lower quadrant pain.  It was radiating into her right flank.  It is colicky.  It was intense, but she controls it with oxycodone/APAP.  UA today is negative.  She was having associated nocturia, incontinence and intermittency.    She does have a prior history of stones treated by Dr. Lonna Cobb in the past.    She was scheduled for ESWL, but stone was not identified during the procedure. The ESWL was aborted.  RUS preformed on noted ***.    Today, ***.       PMH: Past Medical History:  Diagnosis Date  . History of kidney stones   . Kidney stone     Surgical History: Past Surgical History:  Procedure Laterality Date  . CHOLECYSTECTOMY    . EXTRACORPOREAL SHOCK WAVE LITHOTRIPSY Right 06/23/2017   Procedure: EXTRACORPOREAL SHOCK WAVE LITHOTRIPSY (ESWL);  Surgeon: Vanna Scotland, MD;  Location:  [facility] ORS;  Service: Urology;  Laterality: Right;  . KNEE SURGERY    . LITHOTRIPSY      Home Medications:  Allergies as of 07/07/2017   No Known Allergies     Medication List       Accurate as of 07/06/17 12:17 AM. Always use your most recent med list.          MULTI-VITAMINS Tabs Take 1 tablet by mouth daily.   oxyCODONE-acetaminophen 5-325 MG tablet Commonly known as:  ROXICET Take 1 tablet by mouth every 6 (six) hours as needed.       Allergies: No Known Allergies  Family History: Family History  Problem Relation Age of Onset  . Breast cancer Maternal Aunt     Social History:  reports that she has never smoked. She has never used smokeless tobacco. She reports that she does not drink alcohol or use drugs.  ROS:                                        Physical Exam: There were no vitals taken for this visit.  Constitutional: Well nourished. Alert and oriented, No acute distress. HEENT: [place] AT, moist mucus membranes. Trachea midline, no masses. Cardiovascular: No clubbing, cyanosis, or edema. Respiratory: Normal respiratory effort, no increased work of breathing.  GI: Abdomen is soft, non tender, non distended, no abdominal masses.   GU: No CVA tenderness.  No bladder fullness or masses.   Skin: No rashes, bruises or suspicious lesions. Lymph: No cervical or inguinal adenopathy. Neurologic: Grossly intact, no focal deficits, moving all 4 extremities. Psychiatric: Normal mood and affect.  Laboratory Data: Lab Results  Component Value Date   WBC 5.6 06/17/2017   HGB 13.8 06/17/2017   HCT 39.3 06/17/2017   MCV 93.8 06/17/2017   PLT 243 06/17/2017    Lab Results  Component Value Date   CREATININE 0.79 06/17/2017    Lab Results  Component Value Date   AST 21 06/17/2017   Lab Results  Component Value Date   ALT 22 06/17/2017    Urinalysis Negative.  See EPIC.  ***  I have reviewed the labs.   Pertinent Imaging: *** I  have independently reviewed the films.    Assessment & Plan:   1. Right ureteral stone  - ***  2. Right hydronephrosis  - obtain RUS to ensure the hydronephrosis has resolved once stone has passed/treated  3. History of hematuria  - UA today demonstrates no hematuria.    - continue to monitor the patient's UA after the treatment/passage of the stone to ensure the hematuria has resolved  - if hematuria persists, we will pursue a hematuria workup with CT Urogram and cystoscopy if appropriate.  4. History of nephrolithiasis  - patient underwent URS for a 1.8 cm right UPJ stone in 2014   No Follow-up on file.  These notes generated with voice recognition software. I apologize for typographical errors.  Michiel Cowboy, PA-C  [facility] [address] [place], [place] [address] [phone]

## 2017-07-07 ENCOUNTER — Ambulatory Visit: Payer: BLUE CROSS/BLUE SHIELD | Admitting: Urology

## 2017-07-07 ENCOUNTER — Telehealth: Payer: Self-pay | Admitting: Urology

## 2017-07-07 ENCOUNTER — Encounter: Payer: Self-pay | Admitting: Urology

## 2017-07-07 NOTE — Telephone Encounter (Signed)
F.Y.I.  Pt was a no show today and didn't have RUS.   EWSL was cx patient needs a RUS prior to app

## 2017-07-07 NOTE — Telephone Encounter (Signed)
Pt called office this afternoon to reschedule appt that she missed today.  I gave her number to scheduling and told her to schedule RUS first, then call us back and we would get her another appt after RUS.

## 2017-07-19 ENCOUNTER — Ambulatory Visit
Admission: RE | Admit: 2017-07-19 | Discharge: 2017-07-19 | Disposition: A | Discharge status: Home / Self Care | Payer: BLUE CROSS/BLUE SHIELD | Source: Ambulatory Visit | Attending: Urology | Admitting: Urology

## 2017-07-19 DIAGNOSIS — N201 Calculus of ureter: Secondary | ICD-10-CM | POA: Insufficient documentation

## 2017-07-20 ENCOUNTER — Telehealth: Payer: Self-pay

## 2017-07-20 NOTE — Telephone Encounter (Signed)
Spoke with pt in reference to RUS results. Pt voiced understanding.  

## 2017-07-20 NOTE — Telephone Encounter (Signed)
-----   Message from Vanna ScotlandAshley Brandon, MD sent at 07/20/2017  7:51 AM EDT ----- Please let this patient know her renal ultrasound looks excellent. There is no evidence of stones or blockage. She has clearly pass her stone. She may follow-up as needed.  Vanna ScotlandAshley Brandon, MD

## 2017-07-25 NOTE — Telephone Encounter (Signed)
Please contact Mrs. Allena KatzGuzman and see if she has rescheduled her RUS.

## 2017-07-28 NOTE — Telephone Encounter (Signed)
Tried to call patient no answer and wireless number had no voicemail stated call back later.

## 2017-08-12 ENCOUNTER — Ambulatory Visit: Attending: Family Medicine

## 2017-08-12 DIAGNOSIS — I10 Essential (primary) hypertension: Secondary | ICD-10-CM

## 2017-08-12 DIAGNOSIS — E785 Hyperlipidemia, unspecified: Secondary | ICD-10-CM

## 2017-08-12 DIAGNOSIS — E1169 Type 2 diabetes mellitus with other specified complication: Principal | ICD-10-CM

## 2017-08-12 DIAGNOSIS — E669 Obesity, unspecified: Secondary | ICD-10-CM

## 2017-08-12 DIAGNOSIS — R7989 Other specified abnormal findings of blood chemistry: Secondary | ICD-10-CM

## 2017-08-12 LAB — COMPREHENSIVE METABOLIC PANEL
Alanine Transferase (ALT): 57 U/L — ABNORMAL HIGH (ref 4–56)
Alb/Glob Ratio: 0.9 — ABNORMAL LOW (ref 1.0–1.6)
Albumin: 3.7 g/dL (ref 3.2–4.7)
Alkaline Phosphatase (ALP): 71 U/L (ref 38–126)
Aspartate Transaminase (AST): 48 U/L — ABNORMAL HIGH (ref 9–44)
BUN/ Creatinine: 25 — ABNORMAL HIGH (ref 7.3–21.7)
Bilirubin Total: 0.6 mg/dL (ref 0.1–2.2)
Calcium: 9.3 mg/dL (ref 8.7–10.2)
Carbon Dioxide Total: 27 mmol/L (ref 22–32)
Chloride: 102 mmol/L (ref 99–109)
Creatinine Serum: 0.72 mg/dL (ref 0.50–1.30)
E-GFR, Non-African American: 85 mL/min/{1.73_m2} (ref >60)
E-GFR: 85 mL/min/{1.73_m2} (ref >60)
Globulin: 3.9 g/dL (ref 2.2–4.2)
Glucose: 110 mg/dL — ABNORMAL HIGH (ref 70–99)
Potassium: 4.2 mmol/L (ref 3.5–5.2)
Protein: 7.6 g/dL (ref 5.9–8.2)
Sodium: 136 mmol/L (ref 134–143)
Urea Nitrogen, Blood (BUN): 18 mg/dL (ref 6–21)

## 2017-08-12 LAB — CBC WITH DIFFERENTIAL
Basophils % Auto: 0.4 % (ref 0.0–1.0)
Basophils Abs Auto: 0 10*3/uL (ref 0.0–0.1)
Eosinophils % Auto: 3.7 % (ref 0.0–4.0)
Eosinophils Abs Auto: 0.3 10*3/uL — ABNORMAL HIGH (ref 0.0–0.2)
Hematocrit: 38.4 % (ref 36.0–48.0)
Hemoglobin: 13.5 g/dL (ref 12.0–16.0)
Immature Granulocytes % Auto: 0.3 % (ref 0.00–0.50)
Immature Granulocytes Abs Auto: 0 10*3/uL (ref 0.0–0.0)
Lymphocytes % Auto: 23.7 % (ref 5.0–41.0)
Lymphocytes Abs Auto: 1.8 10*3/uL (ref 1.3–2.9)
MCH: 32 pg (ref 27.0–34.0)
MCHC g/dL: 35.2 g/dL (ref 33.0–37.0)
MCV: 91 fL (ref 82.0–97.0)
MPV: 10.7 fL (ref 9.4–12.4)
Monocytes % Auto: 7 % (ref 0.0–10.0)
Monocytes Abs Auto: 0.5 10*3/uL (ref 0.3–0.8)
Neutrophils % Auto: 64.9 % (ref 45.0–75.0)
Neutrophils Abs Auto: 4.95 10*3/uL — ABNORMAL HIGH (ref 2.20–4.80)
Nucleated Cell Count: 0 10*3/uL (ref 0.0–0.1)
Nucleated RBC/100 WBC: 0 % WBC (ref <=0.0)
Platelet Count: 265 10*3/uL (ref 151–365)
RDW: 13.3 % (ref 11.5–14.5)
Red Blood Cell Count: 4.22 10*6/uL (ref 3.80–5.10)
White Blood Cell Count: 7.6 10*3/uL (ref 4.2–10.8)

## 2017-08-12 LAB — LIPID PANEL
Cholesterol: 165 mg/dL (ref 112–200)
HDL Cholesterol: 30 mg/dL — ABNORMAL LOW (ref >=40)
LDL Cholesterol Calculation: 113 mg/dL — ABNORMAL HIGH (ref <100)
Non-HDL Cholesterol: 135 mg/dL (ref <=150.0)
Total Cholesterol: HDL Ratio: 5.5 mg/dL — ABNORMAL HIGH (ref 2.0–5.0)
Triglyceride: 108 mg/dL (ref 30–150)

## 2017-08-12 LAB — HEMOGLOBIN A1C
Hgb A1C,Glucose Est Avg: 137 mg/dL
Hgb A1C: 6.4 % — ABNORMAL HIGH (ref 4.0–5.6)

## 2017-08-12 LAB — TSH WITH FREE T4 REFLEX
Thyroid Stimulating Hormone: 3.01 u[IU]/mL (ref 0.45–5.33)
Thyroxine, Free (Free T4): 0.8 ng/dL (ref 0.6–1.6)

## 2017-08-12 LAB — VITAMIN D, 25 HYDROXY: Vitamin D, 25 Hydroxy: 64.3 ng/mL (ref 30–80)

## 2017-08-15 ENCOUNTER — Telehealth (HOSPITAL_BASED_OUTPATIENT_CLINIC_OR_DEPARTMENT_OTHER): Payer: Self-pay | Admitting: Family Medicine

## 2017-08-15 DIAGNOSIS — R7989 Other specified abnormal findings of blood chemistry: Secondary | ICD-10-CM

## 2017-08-15 DIAGNOSIS — R945 Abnormal results of liver function studies: Principal | ICD-10-CM

## 2017-08-15 NOTE — Telephone Encounter (Signed)
Re-check in 1 week. L/m for c/b

## 2017-08-15 NOTE — Telephone Encounter (Signed)
Should pt wait any length of time before repeating?

## 2017-08-15 NOTE — Telephone Encounter (Signed)
Repeat LFTs and hepatitis panel

## 2017-08-19 ENCOUNTER — Ambulatory Visit (HOSPITAL_BASED_OUTPATIENT_CLINIC_OR_DEPARTMENT_OTHER): Admitting: NURSE PRACTITIONER

## 2017-08-19 ENCOUNTER — Encounter (HOSPITAL_BASED_OUTPATIENT_CLINIC_OR_DEPARTMENT_OTHER): Payer: Self-pay | Admitting: NURSE PRACTITIONER

## 2017-08-19 VITALS — BP 126/78 | HR 86 | Temp 97.6°F | Ht 64.0 in | Wt 191.0 lb

## 2017-08-19 DIAGNOSIS — J209 Acute bronchitis, unspecified: Principal | ICD-10-CM

## 2017-08-19 MED ORDER — AZITHROMYCIN 250 MG TABLET
ORAL_TABLET | ORAL | 0 refills | Status: AC
Start: 2017-08-19 — End: 2017-08-24

## 2017-08-19 NOTE — Patient Instructions (Signed)
Please complete the full course of antibiotic  Symptomatic treatment with rest, increased fluid intake and use of OTC analgesics discussed .  Watch for any persistence/worsening of symptoms .  Follow up instructions given.  RTO as necessary  Note provided for the employer

## 2017-08-19 NOTE — Progress Notes (Signed)
Amanda Williamson, Amanda Williamson   MRN: [id number]  DOB: April 19, 1966          Visit Date: August 19, 2017     [facility], [facility]      Phone: [phone], Fax: [phone]           Chief Complaint: Cough (sore throat, cough, chest tightness with yellow phlegm, and sinus drainage)      History of Present Illness:   [age] yr Female who presents for evaluation of ST that began one week ago . This was  followed by rhinorrhea, plugging of her ears, chest congestion and productive cough that  began yesterday   She has treated with Nyquil and Chloraseptic spray with minimal benefit  She reports intermittent chills and tactile fever  Denies SOB, CP, orthopnea or distal pitting edema  No ill family members   PMH: DM, HTN, RAD, and dyslipedemia       Past Medical History:   Patient Active Problem List   Diagnosis    RAD (reactive airway disease)    Erythema nodosum    PFS (patellofemoral syndrome)    Diabetes mellitus type 2 in obese (HCC)    HTN (hypertension)    Dyslipidemia    Colon polyps       Past Surgical History:   Past Surgical History:   Procedure Laterality Date    COLONOSCOPY  07/08/2012    WNL    COLONOSCOPY  02/24/2015    ESOPHAGOGASTRODUODENOSCOPY (EGD)  02/24/2015    LIGATION, FALLOPIAN TUBE, BILATERAL, USING TUBAL RING  1994        Review of Systems:   Review of Systems   Constitutional: Negative for activity change, appetite change, chills, fatigue and unexpected weight change.   HENT: Positive for congestion and rhinorrhea.    Eyes: Negative.  Negative for visual disturbance.   Respiratory: Positive for cough. Negative for shortness of breath and wheezing.    Cardiovascular: Negative for chest pain, palpitations and leg swelling.   Gastrointestinal: Negative.  Negative for abdominal pain, blood in stool and constipation.   Endocrine: Negative.    Genitourinary: Negative.    Musculoskeletal: Negative.  Negative for arthralgias and back pain.   Skin: Negative.    Neurological:  Negative.  Negative for dizziness and weakness.   Hematological: Negative.    Psychiatric/Behavioral: Negative.        Family History:   family history includes Arthritis in her mother; Diabetes in her father and son; Lipids in her mother; No Known Problems in her brother, brother, brother, daughter, daughter, daughter, and sister; Other in her father and son.    Social History:    reports that  has never smoked. she has never used smokeless tobacco. She reports that she does not drink alcohol or use drugs.    Allergies:  No Known Allergies    Medications:    Current Outpatient Medications:     FIBER, PSYLLIUM HUSK, PO, , Disp: , Rfl:     Lisinopril (PRINIVIL, ZESTRIL) 2.5 mg Tablet, TAKE ONE TABLET BY MOUTH EVERY DAY, Disp: 90 tablet, Rfl: 1    Metformin (GLUCOPHAGE) 1,000 mg tablet, Take 1 tablet by mouth 2 times daily with meals., Disp: 180 tablet, Rfl: 1    MULTIVITAMIN PO, Twice a day, Disp: , Rfl:     MV-MIN/VIT C/GLUT/LYSINE/HC124 ([place], WITH LYSINE ACETATE, PO), Seasonal       , Disp: , Rfl:     Simvastatin (ZOCOR) 10 mg Tablet, Take 1  tablet by mouth every day., Disp: 90 tablet, Rfl: 1    Immunization:  Immunization History   Administered Date(s) Administered    Influenza, Seasonal, Injectable, Preservative Free 09/02/2013       Health Maintenance:  Health Maintenance   Topic Date Due    Tdap  05/03/1977    HIV SCREENING  01/02/1979    Pneumococcal vaccine (1 of 1 - PPSV23) 01/01/1985    CERVICAL HPV TEST (WITH NEXT PAP)  01/02/1996    Shingrix (Zoster) (1 of 2) 01/02/2016    INFLUENZA  05/04/2017    MAMMOGRAM  09/10/2018    PAP-GYN CYTOLOGY  03/09/2019    COLONOSCOPY  02/23/2025       Physical Examination:   BP 126/78   Pulse 86   Temp 36.4 C (97.6 F) (Temporal)   Ht 1.626 m (5\' 4" )   Wt 86.6 kg (191 lb)   SpO2 96%   BMI 32.79 kg/m , Body mass index is 32.79 kg/m.     Physical Exam   Constitutional: She is oriented to person, place, and time. She appears well-developed and  well-nourished.   HENT:   Head: Normocephalic and atraumatic.   Eyes: Pupils are equal, round, and reactive to light.   Cardiovascular: Normal rate, regular rhythm and normal heart sounds.   Pulmonary/Chest: Effort normal.   Faint scattered rhonchi. No rales or wheezing    Lymphadenopathy:     She has no cervical adenopathy.   Neurological: She is alert and oriented to person, place, and time.   Skin: Skin is warm.   Psychiatric: She has a normal mood and affect. Her behavior is normal. Judgment and thought content normal.   Vitals reviewed.        No results found.  LAB TESTS/STUDIES:   Lab Visit on 08/12/2017   Component Date Value Ref Range Status    White Blood Cell Count 08/12/2017 7.6  4.2 - 10.8 10*3/uL Final    Red Blood Cell Count 08/12/2017 4.22  3.80 - 5.10 10*6/uL Final    Hemoglobin 08/12/2017 13.5  12.0 - 16.0 g/dL Final    Hematocrit 84/13/2440 38.4  36.0 - 48.0 % Final    MCV 08/12/2017 91.0  82.0 - 97.0 fL Final    MCH 08/12/2017 32.0  27.0 - 34.0 pg Final    MCHC 08/12/2017 35.2  33.0 - 37.0 g/dL Final    RDW 07/31/2535 13.3  11.5 - 14.5 % Final    MPV 08/12/2017 10.7  9.4 - 12.4 fL Final    Platelet Count 08/12/2017 265  151 - 365 10*3/uL Final    Neutrophils % Auto 08/12/2017 64.9  45.0 - 75.0 % Final    Lymphocytes % Auto 08/12/2017 23.7  5.0 - 41.0 % Final    Monocytes % Auto 08/12/2017 7.0  0.0 - 10.0 % Final    Eosinophil % Auto 08/12/2017 3.7  0.0 - 4.0 % Final    Basophils % Auto 08/12/2017 0.4  0.0 - 1.0 % Final    Immature Granulocytes % 08/12/2017 0.30  0.00 - 0.50 % Final    Neutrophil Abs Auto 08/12/2017 4.95* 2.20 - 4.80 10*3/uL Final    Lymphocyte Abs Auto 08/12/2017 1.8  1.3 - 2.9 10*3/uL Final    Monocytes Abs Auto 08/12/2017 0.5  0.3 - 0.8 10*3/uL Final    Eosinophil Abs Auto 08/12/2017 0.3* 0.0 - 0.2 10*3/uL Final    Basophils Abs Auto 08/12/2017 0.0  0.0 - 0.1 10*3/uL Final  Nucleated RBC % Auto 08/12/2017 0.0  <=0.0 % WBC Final    NRBC Abs Auto  08/12/2017 0.0  0.0 - 0.1 10*3/uL Final    Immature Granulocytes Abs Auto 08/12/2017 0.0  0.0 - 0.0 10*3/uL Final    Sodium 08/12/2017 136  134 - 143 mmol/L Final    Potassium 08/12/2017 4.2  3.5 - 5.2 mmol/L Final    Chloride 08/12/2017 102  99 - 109 mmol/L Final    Carbon Dioxide Total 08/12/2017 27  22 - 32 mmol/L Final    Urea Nitrogen, Blood (BUN) 08/12/2017 18  6 - 21 mg/dL Final    Glucose 16/10/960411/06/2017 110* 70 - 99 mg/dL Final    Calcium 54/09/811911/06/2017 9.3  8.7 - 10.2 mg/dL Final    Protein 14/78/295611/06/2017 7.6  5.9 - 8.2 g/dL Final    Albumin 21/30/865711/06/2017 3.7  3.2 - 4.7 g/dL Final    Alkaline Phosphatase (ALP) 08/12/2017 71  38 - 126 U/L Final    Aspartate Transaminase (AST) 08/12/2017 48* 9 - 44 U/L Final    Bilirubin Total 08/12/2017 0.6  0.1 - 2.2 mg/dL Final    Alanine Transferase (ALT) 08/12/2017 57* 4 - 56 U/L Final    Creatinine Serum 08/12/2017 0.72  0.50 - 1.30 mg/dL Final    Globulin 84/69/629511/06/2017 3.9  2.2 - 4.2 g/dL Final    Alb/Glob Ratio 08/12/2017 0.9* 1.0 - 1.6 Final    BUN/ Creatinine 08/12/2017 25.0* 7.3 - 21.7 Final    E-GFR 08/12/2017 85  >60 mL/min/1.4089m*2 Final    E-GFR, Non-African American 08/12/2017 85  >60 mL/min/1.5489m*2 Final    Hgb A1C 08/12/2017 6.4* 4.0 - 5.6 % Final    HGB A1C,GLUCOSE EST AVG 08/12/2017 137  mg/dL Final    Cholesterol 28/41/324411/06/2017 165  112 - 200 mg/dL Final    Triglyceride 01/02/725311/06/2017 108  30 - 150 mg/dL Final    HDL Cholesterol 08/12/2017 30* >=40 mg/dL Final    LDL Cholesterol Calculation 08/12/2017 113* <100 mg/dL Final    Non-HDL Cholesterol 08/12/2017 135.0  <=150.0 mg/dL Final    Total Cholesterol: HDL Ratio 08/12/2017 5.5* 2.0 - 5.0 mg/dL Final    Thyroid Stimulating Hormone 08/12/2017 3.01  0.45 - 5.33 uIU/mL Final    Thyroxine, Free (Free T4) 08/12/2017 0.8  0.6 - 1.6 ng/dL Final    Vitamin D, 25 Hydroxy 08/12/2017 64.3  30 - 80 ng/mL Final     ASSESSMENT & PLAN:    Sharlee BlewMaricela was seen today for cough.    Diagnoses and all orders for this  visit:    Acute bronchitis, unspecified organism  -     Azithromycin (ZITHROMAX) 250 mg Tablet; Take 2 tablets orally today, then 1 tablet daily for four more days.      Patient Instructions   Please complete the full course of antibiotic  Symptomatic treatment with rest, increased fluid intake and use of OTC analgesics discussed .  Watch for any persistence/worsening of symptoms .  Follow up instructions given.  RTO as necessary  Note provided for the employer       Betsey HolidayAlice Hortence Charter, NP

## 2017-08-22 ENCOUNTER — Telehealth (HOSPITAL_BASED_OUTPATIENT_CLINIC_OR_DEPARTMENT_OTHER): Payer: Self-pay | Admitting: Family Medicine

## 2017-08-22 DIAGNOSIS — J40 Bronchitis, not specified as acute or chronic: Principal | ICD-10-CM

## 2017-08-22 MED ORDER — AMOXICILLIN 875 MG-POTASSIUM CLAVULANATE 125 MG TABLET
1.0000 | ORAL_TABLET | Freq: Two times a day (BID) | ORAL | 0 refills | Status: AC
Start: 2017-08-22 — End: 2017-09-01

## 2017-08-22 NOTE — Telephone Encounter (Signed)
Please check back with the patient, see how she is feeling.  Repeat labs, after she finishes the antibiotics and schedule follow-up with us.

## 2017-08-22 NOTE — Telephone Encounter (Signed)
Pt still not feeling any better. States she is still coughing and feel weak. She has been on Zpack. Wants to know if she needs different medication. Has been off work Friday, Sunday and Monday. Please advise

## 2017-08-22 NOTE — Telephone Encounter (Signed)
Changed antibiotic to Augmentin

## 2017-08-22 NOTE — Telephone Encounter (Signed)
Spoke with pt- pts states she is not feeling any better. She will be on her last day tomorrow for abx. Should pt come in our can you prescribe something else? I tentatively put pt on scheduled for 10:15 tom.

## 2017-08-23 ENCOUNTER — Ambulatory Visit (HOSPITAL_BASED_OUTPATIENT_CLINIC_OR_DEPARTMENT_OTHER): Admitting: Family Medicine

## 2017-08-23 NOTE — Telephone Encounter (Signed)
Alice sent another abx for pt last night. I let pt know to give us a call if symptoms do not get better. Pt wanted to cancel appt for today. I will f/u next week.

## 2017-08-23 NOTE — Telephone Encounter (Signed)
Okay, please check back with the patient next week

## 2017-08-23 NOTE — Telephone Encounter (Signed)
Pt informed

## 2017-09-02 ENCOUNTER — Encounter (HOSPITAL_BASED_OUTPATIENT_CLINIC_OR_DEPARTMENT_OTHER): Payer: Self-pay | Admitting: Family Medicine

## 2017-09-02 ENCOUNTER — Ambulatory Visit: Attending: Family Medicine

## 2017-09-02 ENCOUNTER — Ambulatory Visit (HOSPITAL_BASED_OUTPATIENT_CLINIC_OR_DEPARTMENT_OTHER): Admitting: Family Medicine

## 2017-09-02 ENCOUNTER — Ambulatory Visit
Admission: RE | Admit: 2017-09-02 | Discharge: 2017-09-02 | Disposition: A | Discharge status: Home / Self Care | Source: Ambulatory Visit | Attending: Family Medicine | Admitting: Family Medicine

## 2017-09-02 ENCOUNTER — Ambulatory Visit
Admission: RE | Admit: 2017-09-02 | Discharge: 2017-09-02 | Disposition: A | Discharge status: Home / Self Care | Source: Ambulatory Visit

## 2017-09-02 ENCOUNTER — Ambulatory Visit

## 2017-09-02 VITALS — BP 128/88 | HR 95 | Temp 97.5°F | Resp 18 | Ht 64.0 in | Wt 193.4 lb

## 2017-09-02 DIAGNOSIS — R059 Cough, unspecified: Secondary | ICD-10-CM

## 2017-09-02 DIAGNOSIS — N760 Acute vaginitis: Principal | ICD-10-CM

## 2017-09-02 DIAGNOSIS — R05 Cough: Principal | ICD-10-CM

## 2017-09-02 DIAGNOSIS — R0602 Shortness of breath: Secondary | ICD-10-CM

## 2017-09-02 MED ORDER — PREDNISONE 20 MG TABLET: ORAL_TABLET | ORAL | 0 refills | Status: DC

## 2017-09-02 MED ORDER — ALBUTEROL SULFATE HFA 90 MCG/ACTUATION AEROSOL INHALER: 1.0000 | INHALATION_SPRAY | Freq: Four times a day (QID) | RESPIRATORY_TRACT | 1 refills | Status: DC | PRN

## 2017-09-02 MED ORDER — AMOXICILLIN 875 MG-POTASSIUM CLAVULANATE 125 MG TABLET
1.0000 | ORAL_TABLET | Freq: Two times a day (BID) | ORAL | 0 refills | Status: AC
Start: 2017-09-02 — End: 2017-09-12

## 2017-09-02 MED ORDER — TERCONAZOLE 0.4 % VAGINAL CREAM
1.0000 | TOPICAL_CREAM | Freq: Every day | VAGINAL | 0 refills | Status: AC
Start: 2017-09-02 — End: 2017-09-09

## 2017-09-02 NOTE — Telephone Encounter (Signed)
L/m for c/b

## 2017-09-02 NOTE — Progress Notes (Signed)
Thank you have a good weekend thanks HPI,    51 years old female patient presenting to the clinic today with a history of upper respiratory symptoms that started around November 12.    Initial sore throat that has resolved.  Cough which has been persistent, with mild expectoration.  Having symptoms of chest tightness, congestion.  Noticed some wheezing on and off.    She was seen by nurse practitioner Betsey HolidayAlice Phillips on 11/19 and prescribed Zithromax, which did not help.  She was then given Augmentin for 10 days that she has finished yesterday.    Symptoms are somewhat better.    VAGINITIS, vaginal itching and discharge that started yesterday.  Has a history of vaginal candidiasis in the past.    ELEVATED LIVER test, recent labs showed liver test to be borderline.  Has not repeated the test yet.  Reports drinking excessive alcohol during her recent visit to [place], that she has now stopped.  Is also on statins.    REVIEW of system,  Persistent cough.  Mild expectoration.  Initial sore throat which has resolved.  Denies any runny nose or sneezing.  No fever or chills.  Feels achy on and off.  Denies any heat or cold intolerance.  Denies any chest pain or palpitations.  Mild shortness of breath/wheezing that has improved  No nausea vomiting.  Appetite stable.  Tolerating diet well.  Denies any abnormal bleeding or bruising.    EXAMINATION,    Alert, oriented in time place and person.  No acute distress.  O2 sats are stable at room air.  Coughing present.    HEENT, pupils equal reactive.  Oral mucosa pink and moist.  No exudates or hypertrophy present.  Tympanic membranes and auditory canals appear normal.   Mild right-sided maxillary tenderness present.  NECK, supple.  No palpable lymph nodes.  CHEST, bilaterally clear to auscultation good entry bilaterally minimal expiratory wheeze present bilaterally.  ABDOMEN, soft to touch.  Nontender.  No hepatosplenomegaly.  No masses palpated.  EXTREMITIES, there is no pedal  edema  EXTERNAL GENITALIA, normal speculum examination, thick white discharge is present.  No rash noted.  Cultures are taken.    PLAN,    1.  PERSISTENT COUGH, symptoms ongoing for about 3 weeks.  Possible bronchitis/sinusitis.  Has had some symptoms of shortness of breath/wheeze.    Restart back on Augmentin.  Albuterol inhaler as discussed.  Prednisone 40 mg daily for 5 days.  Depending upon how she does, might need it weaned off.  Symptomatic treatment with rest, increase fluids, steam inhalation.   Await results of x-ray chest and sinuses.  Work notice given for 2-3 days.  Follow-up instructions given    2.  ELEVATED LFTS repeat labs.  Reports that she has stopped taking alcohol.      3.  VAGINITIS, start on Terazol vaginal cream.  Await results of the culture.

## 2017-09-02 NOTE — Telephone Encounter (Signed)
Spoke with pt- pt is only slightly feeling better. Scheduled today at 11:30 am

## 2017-09-05 ENCOUNTER — Other Ambulatory Visit (HOSPITAL_BASED_OUTPATIENT_CLINIC_OR_DEPARTMENT_OTHER): Payer: Self-pay | Admitting: Family Medicine

## 2017-09-05 ENCOUNTER — Telehealth (HOSPITAL_BASED_OUTPATIENT_CLINIC_OR_DEPARTMENT_OTHER): Payer: Self-pay | Admitting: Family Medicine

## 2017-09-05 LAB — MMC VAGINITIS PANEL
Candida species DNA Probe: POSITIVE — AB
Gardnerella vaginalis DNA Probe: POSITIVE — AB
Trichomonas vaginalis DNA Probe: NEGATIVE

## 2017-09-05 MED ORDER — METRONIDAZOLE 500 MG TABLET: ORAL_TABLET | ORAL | 0 refills | Status: AC

## 2017-09-05 NOTE — Telephone Encounter (Signed)
Please notify patient the vaginal cultures came positive both for yeast as well as a bacterial infection.  She had been given treatment for yeast at the last appointment.  Rx is done for tablet Flagyl.  Avoid alcohol with it.

## 2017-09-05 NOTE — Telephone Encounter (Signed)
Pt.notified

## 2017-09-06 ENCOUNTER — Ambulatory Visit (HOSPITAL_BASED_OUTPATIENT_CLINIC_OR_DEPARTMENT_OTHER): Admitting: Family Medicine

## 2017-09-06 ENCOUNTER — Encounter (HOSPITAL_BASED_OUTPATIENT_CLINIC_OR_DEPARTMENT_OTHER): Payer: Self-pay | Admitting: Family Medicine

## 2017-09-06 ENCOUNTER — Other Ambulatory Visit: Attending: Family Medicine

## 2017-09-06 ENCOUNTER — Other Ambulatory Visit: Admitting: Family Medicine

## 2017-09-06 VITALS — BP 124/86 | HR 106 | Resp 18 | Ht 64.0 in | Wt 192.0 lb

## 2017-09-06 DIAGNOSIS — J40 Bronchitis, not specified as acute or chronic: Principal | ICD-10-CM

## 2017-09-06 DIAGNOSIS — N760 Acute vaginitis: Secondary | ICD-10-CM

## 2017-09-06 LAB — MMC CULTURE, AEROBIC

## 2017-09-06 NOTE — Progress Notes (Signed)
HPI,  51 years old female patient presenting to the clinic today for a follow-up.  Symptoms of URI that started around November 12.  For further details please see my last note dated 09/02/17.    She was initially prescribed Zithromax, which did not help and she was started on Augmentin for 10 days which made some difference to her symptoms    Persistent cough with mild wheezing and chest tightness.    Was started on a second round of Augmentin when seen on 09/02/17 and also on prednisone 40 mg daily for 5 days    However she states that she has not started the prednisone yet.  Her symptoms remain the same.    Has used albuterol only 1 puff once yesterday.    VAGINITIS, symptoms of vaginal itching and discharge that started around 09/01/17.    Her vaginal culture showed presence of yeast as well as bacterial vaginosis.  She was started on Terazol vaginal cream, also started on Flagyl yesterday.    Her symptoms are better  Reports that she has not been sexually active.  No new sexual partners.    ROS,    Persistent cough.  Mild expectoration.  Initial sore throat has resolved.  No runny nose sneezing.  Denies any facial pain/pressure/postnasal drip.  No fever or chills.  Fatigue present.  Some chest tightness with symptoms of wheezing, on and off.  No acute shortness of breath.  No chest pain or palpitations.  Denies any bowel or bladder symptoms.  Symptoms of vaginal itching have improved.    Physical Exam   Constitutional: She appears well-developed and well-nourished.   HENT:   Head: Normocephalic and atraumatic.   Right Ear: External ear normal.   Left Ear: External ear normal.   Mouth/Throat: Oropharynx is clear and moist.   Eyes: Conjunctivae and EOM are normal. Pupils are equal, round, and reactive to light.   Neck: Normal range of motion. Neck supple. No thyromegaly present.   Cardiovascular: Normal rate, regular rhythm and normal heart sounds.   Pulmonary/Chest: No respiratory distress. She has wheezes. She  has no rales. She exhibits no tenderness.   Minimal expiratory wheeze bilaterally.   Lymphadenopathy:     She has no cervical adenopathy.   Nursing note and vitals reviewed.  PLAN,    1.  BRONCHITIS,    X-ray chest x-ray sinuses are negative.  Vitals stable.  Afebrile.  O2 sats at room air are normal.  Patient did not start prednisone, compliance discussed.  Start on prednisone 40 mg daily for 5 days.  Follow-up in 4-5 days, to see if it needs to be weaned off.  Side effects of prednisone are again discussed.    She is currently in the second round of Augmentin.  Symptomatic treatment with rest increase fluids steam inhalation and salt water gargles.  Patient requested a note to be off work for 4-5 days, that was given to her      2.  VAGINITIS,    Cultures positive both for yeast as well as bacterial vaginosis.  She is on Terazol vaginal cream and Flagyl.  Avoid alcohol with the same.    Her vaginal culture also showed the presence of CRE E. coli.  Discussed with gynecology.    No urinary symptoms.  No new sexual partners.

## 2017-09-07 MED ORDER — PREDNISONE 20 MG TABLET: ORAL_TABLET | ORAL | 0 refills | Status: AC

## 2017-09-09 ENCOUNTER — Encounter (HOSPITAL_BASED_OUTPATIENT_CLINIC_OR_DEPARTMENT_OTHER): Payer: Self-pay | Admitting: Family Medicine

## 2017-09-09 ENCOUNTER — Ambulatory Visit (HOSPITAL_BASED_OUTPATIENT_CLINIC_OR_DEPARTMENT_OTHER): Admitting: Family Medicine

## 2017-09-09 VITALS — BP 126/82 | HR 88 | Temp 97.8°F | Resp 17 | Ht 64.0 in | Wt 192.2 lb

## 2017-09-09 DIAGNOSIS — R7989 Other specified abnormal findings of blood chemistry: Secondary | ICD-10-CM

## 2017-09-09 DIAGNOSIS — E1169 Type 2 diabetes mellitus with other specified complication: Secondary | ICD-10-CM

## 2017-09-09 DIAGNOSIS — E669 Obesity, unspecified: Secondary | ICD-10-CM

## 2017-09-09 DIAGNOSIS — J40 Bronchitis, not specified as acute or chronic: Secondary | ICD-10-CM

## 2017-09-09 DIAGNOSIS — R945 Abnormal results of liver function studies: Secondary | ICD-10-CM

## 2017-09-09 MED ORDER — PREDNISONE 10 MG TABLET: ORAL_TABLET | ORAL | 0 refills | Status: AC

## 2017-09-09 NOTE — Progress Notes (Signed)
HPI,    51 years old female patient presenting to the clinic today for a follow-up.  For further details please see my last note dated 09/06/17    She has started on prednisone 40 mg daily, today being her third day.  Has started feeling significantly better.    Symptoms of cough, chest tightness and wheezing have now resolved.  Shortness of breath has resolved.    She is also currently on Augmentin.  History of vaginitis, positive for both yeast as well as bacterial vaginosis, currently on treatment for both      ELEVATED LFTS, the last labs done 08/12/17 showed AST at 48 and ALT at 57.  Has not repeated labs yet.    History is also significant for diabetes mellitus type 2, hypertension and dyslipidemia.    ROS,  Cough is significantly better.  No expectoration  No fever.  Fatigue improving.  Shortness of breath and wheezing has resolved.  No chest pain or palpitations.  Appetite stable, no nausea vomiting.  Denies any mood swings.  No abnormal bleeding or bruising.    EXAMINATION,  Alert, oriented in time place and person.  Pleasant to talk to.  No acute distress.  Vitals stable.  HEENT, pupils equal reactive.  Oral mucosa pink and moist.  Tympanic membrane surgery counts been normal  No facial pain/tenderness no postnasal drip.  NECK, no palpated lymph nodes.  No thyroid mass.  CHEST, bilaterally clear to auscultation good entry bilaterally no additional sounds present.  CARDIOVASCULAR, S1-S2 regular no murmurs.  ABDOMEN, obesity present.  Nontender.  No paraspinal megaly.  EXTREMITIES, no pedal edema.    PLAN,    1.  BRONCHITIS, doing better.  X-rays of the chest and sinuses were negative.  Taking prednisone 40 mg for a total of 5 days.  Then wean it down.  Take 20 mg for 2 days, 10 mg for 2 days and 5 mg for 2 days.  Watch for any persistent/worsening of symptoms.  Detailed follow-up instructions given.  Continue with symptomatic treatment.    2.  ELEVATED LFTs, she will repeat labs after she is off  prednisone.    3.  DIABETES MELLITUS, last A1c was 6.4.  Diabetic education is reinforced.

## 2017-09-13 ENCOUNTER — Other Ambulatory Visit

## 2017-09-13 LAB — LAB MISCELLANEOUS PROCEDURE SENDOUT

## 2017-09-13 NOTE — Addendum Note (Signed)
Addended by: Lidia CollumPHILLIPS, Seleni Meller A on: 09/13/2017 02:04 PM     Modules accepted: Orders

## 2017-09-22 ENCOUNTER — Other Ambulatory Visit (HOSPITAL_BASED_OUTPATIENT_CLINIC_OR_DEPARTMENT_OTHER): Payer: Self-pay | Admitting: Family Medicine

## 2017-09-22 NOTE — Telephone Encounter (Signed)
lov 12.7.18  Nov 3.8.19    Hgb A1C   Date/Time Value Ref Range Status   08/12/2017 09:23 AM 6.4 (H) 4.0 - 5.6 % Final

## 2017-10-06 ENCOUNTER — Telehealth (HOSPITAL_BASED_OUTPATIENT_CLINIC_OR_DEPARTMENT_OTHER): Payer: Self-pay | Admitting: Family Medicine

## 2017-10-06 NOTE — Telephone Encounter (Signed)
Pt is requesting a revised note for time off work due to bronchitis. Pt needs note to includes dates 11.30.2018-12.09.2018 and state that pt was unable to work due to bronchitis. Can you please advise?

## 2017-10-07 NOTE — Telephone Encounter (Signed)
Informed pt .

## 2017-10-07 NOTE — Telephone Encounter (Signed)
Note done

## 2017-10-18 ENCOUNTER — Ambulatory Visit
Admission: RE | Admit: 2017-10-18 | Discharge: 2017-10-18 | Disposition: A | Discharge status: Home / Self Care | Source: Ambulatory Visit | Attending: Nurse Practitioner | Admitting: Nurse Practitioner

## 2017-10-18 ENCOUNTER — Ambulatory Visit
Admission: RE | Admit: 2017-10-18 | Discharge: 2017-10-18 | Disposition: A | Discharge status: Home / Self Care | Source: Ambulatory Visit

## 2017-10-18 ENCOUNTER — Ambulatory Visit (HOSPITAL_BASED_OUTPATIENT_CLINIC_OR_DEPARTMENT_OTHER): Admitting: Nurse Practitioner

## 2017-10-18 ENCOUNTER — Encounter (HOSPITAL_BASED_OUTPATIENT_CLINIC_OR_DEPARTMENT_OTHER): Payer: Self-pay | Admitting: Nurse Practitioner

## 2017-10-18 ENCOUNTER — Ambulatory Visit

## 2017-10-18 VITALS — BP 120/86 | HR 85 | Temp 97.1°F | Resp 14 | Ht 64.0 in | Wt 194.0 lb

## 2017-10-18 DIAGNOSIS — R109 Unspecified abdominal pain: Secondary | ICD-10-CM

## 2017-10-18 DIAGNOSIS — R350 Frequency of micturition: Secondary | ICD-10-CM

## 2017-10-18 DIAGNOSIS — M545 Low back pain, unspecified: Secondary | ICD-10-CM

## 2017-10-18 DIAGNOSIS — R140 Abdominal distension (gaseous): Principal | ICD-10-CM

## 2017-10-18 LAB — POC URINALYSIS-DIPSTICK
POC BILIRUBIN URINE: NEGATIVE
POC KETONES: NEGATIVE
POC LEUK. ESTERASE: NEGATIVE
POC NITRITE URINE: NEGATIVE
POC PH URINALYSIS: 6 pH (ref 4.8–7.8)
POC PROTEIN URINE: NEGATIVE
POC SP GRAVITY: 1.005 (ref 1.002–1.030)
POC UROBILINOGEN: 0.2 EU/dL

## 2017-10-18 LAB — LIPASE: LIPASE: 24 U/L (ref 22–51)

## 2017-10-18 LAB — COMPREHENSIVE METABOLIC PANEL
ALANINE TRANSFERASE (ALT): 47 U/L (ref 4–56)
ALB/GLOB RATIO_MMC: 1.1 (ref 1.0–1.6)
ASPARTATE TRANSAMINASE (AST): 41 U/L (ref 9–44)
Albumin: 3.8 g/dL (ref 3.2–4.7)
Alkaline Phosphatase (ALP): 67 U/L (ref 38–126)
BILIRUBIN TOTAL: 0.5 mg/dL (ref 0.1–2.2)
BUN/CREATININE_MMC: 15.9 (ref 7.3–21.7)
CALCIUM: 9 mg/dL (ref 8.7–10.2)
CARBON DIOXIDE TOTAL: 26 mmol/L (ref 22–32)
CHLORIDE: 103 mmol/L (ref 99–109)
Creatinine Serum: 0.63 mg/dL (ref 0.50–1.30)
E-GFR, NON-AFRICAN AMERICAN: 99 mL/min/{1.73_m2} (ref 60–?)
E-GFR_MMC: 99 mL/min/{1.73_m2} (ref 60–?)
GLOBULIN BLOOD_MMC: 3.5 g/dL (ref 2.2–4.2)
GLUCOSE: 184 mg/dL — AB (ref 70–99)
POTASSIUM: 4.1 mmol/L (ref 3.5–5.2)
PROTEIN: 7.3 g/dL (ref 5.9–8.2)
SODIUM: 135 mmol/L (ref 134–143)
UREA NITROGEN, BLOOD (BUN): 10 mg/dL (ref 6–21)

## 2017-10-18 LAB — AMYLASE: AMYLASE: 45 U/L (ref 28–100)

## 2017-10-18 MED ORDER — CYCLOBENZAPRINE 10 MG TABLET
10.0000 mg | ORAL_TABLET | Freq: Every day | ORAL | 0 refills | Status: AC | PRN
Start: 2017-10-18 — End: 2017-10-28

## 2017-10-18 MED ORDER — IBUPROFEN 600 MG TABLET
600.0000 mg | ORAL_TABLET | Freq: Four times a day (QID) | ORAL | 3 refills | Status: AC | PRN
Start: 2017-10-18 — End: 2017-10-28

## 2017-10-18 MED FILL — ketorolac 30 mg/mL (1 mL) injection solution: INTRAMUSCULAR | Qty: 1 | Status: AC

## 2017-10-18 NOTE — Progress Notes (Signed)
HPI   Present to the clinic with the complaints of left flank pain for 5 days . Pain on the left side of the back ,and radiate leg and abdomen .She get constant pain .Pain 9/10 now . She also has pain with ROM ,bending . She felt nauseous yesterday . No fever,chills . She also c/o of urinary frequency . She is on her menstruations .  Denies vomiting ,abdominal pain . She get abdominal bloating .  History of kidney stone.     Past Medical History:   Diagnosis Date    Diabetes (HCC)     Erythema nodosum     H/O mammogram 09/10/2016    Kidney stones     PFS (patellofemoral syndrome)     Knee pain    RAD (reactive airway disease)        No Known Allergies    Temp: 36.2 C (97.1 F) (01/15 1109)  Temp src: Temporal (01/15 1109)  Pulse: 85 (01/15 1109)  BP: 120/86 (01/15 1109)  Resp: 14 (01/15 1109)  SpO2: 99 % (01/15 1109)  Height: 162.6 cm (5\' 4" ) (01/15 1109)  Weight: 88 kg (194 lb) (01/15 1109)       Review of Systems   Constitutional: Negative.    HENT: Negative.    Respiratory: Negative.    Cardiovascular: Negative.    Gastrointestinal: Positive for abdominal pain and nausea.        Left flank pain ,pain radiate to left lower abdomen    Musculoskeletal: Positive for back pain (left lower back ,radaite to leg ,pain with ROM ).   Neurological: Negative.    Psychiatric/Behavioral: Negative.           Physical Exam   Constitutional: She is oriented to person, place, and time. She appears well-developed and well-nourished.   HENT:   Head: Normocephalic and atraumatic.   Cardiovascular: Normal rate and regular rhythm.   Pulmonary/Chest: Effort normal.   Abdominal: Soft. Bowel sounds are normal. She exhibits distension. There is tenderness. There is CVA tenderness (left flank ). There is no guarding. A hernia is present. Hernia confirmed positive in the ventral area.   Abdominal bloating    Musculoskeletal: She exhibits tenderness (left lower back ,pain with ROM ).   Neurological: She is alert and oriented to person,  place, and time.   Skin: Skin is warm.   Psychiatric: She has a normal mood and affect.       Current Outpatient Medications   Medication Sig Dispense Refill    Albuterol (PROAIR HFA) 90 mcg/actuation inhaler Take 1-2 puffs by inhalation every 6 hours if needed. 8.5 g 1    FIBER, PSYLLIUM HUSK, PO       Lisinopril (PRINIVIL, ZESTRIL) 2.5 mg Tablet TAKE ONE TABLET BY MOUTH EVERY DAY 90 tablet 1    Metformin (GLUCOPHAGE) 1,000 mg tablet TAKE 1 TABLET BY MOUTH TWICE DAILY WITH MEALS 180 tablet 1    MULTIVITAMIN PO Twice a day      MV-MIN/VIT C/GLUT/LYSINE/HC124 (AIRBORNE, WITH LYSINE ACETATE, PO) Seasonal                 Simvastatin (ZOCOR) 10 mg Tablet Take 1 tablet by mouth every day. 90 tablet 1     No current facility-administered medications for this visit.    Tanganyika was seen today for pain.    Diagnoses and all orders for this visit:    Left flank pain  -     [facility] ABDOMEN 1 VIEW;  Future  -     [facility] US KIDNEY/BLADDER  -     [facility]  Toradol injection given .  US of the abdomen ordered .   US of the kidney and bladder order to r/o kidney stone .   Drink fluids .   Go to Er if any changes in the symptoms .   Acute midline low back pain without sciatica  -     [facility] LUMBAR SPINE 2 OR 3 VIEWS; Future  -     K[facility]OROLAC INJ CLINIC  Take ibuprofen with food .  Take flexeril at bed time .   Urinary frequency  -     POC URINALYSIS-DIPSTICK  U/A negative for infections .   Abdominal bloating  -     COMPREHENSIVE METABOLIC PANEL; Future  -     AMYLASE; Future  -     LIPASE; Future

## 2017-10-26 ENCOUNTER — Ambulatory Visit

## 2017-12-08 ENCOUNTER — Ambulatory Visit

## 2017-12-08 ENCOUNTER — Ambulatory Visit: Attending: Family Medicine

## 2017-12-08 DIAGNOSIS — E1169 Type 2 diabetes mellitus with other specified complication: Secondary | ICD-10-CM

## 2017-12-08 DIAGNOSIS — R945 Abnormal results of liver function studies: Principal | ICD-10-CM

## 2017-12-08 DIAGNOSIS — R7989 Other specified abnormal findings of blood chemistry: Secondary | ICD-10-CM

## 2017-12-08 DIAGNOSIS — E669 Obesity, unspecified: Secondary | ICD-10-CM

## 2017-12-08 LAB — COMPREHENSIVE METABOLIC PANEL
Alanine Transferase (ALT): 51 U/L (ref 4–56)
Alb/Glob Ratio: 1.1 (ref 1.0–1.6)
Albumin: 3.8 g/dL (ref 3.2–4.7)
Alkaline Phosphatase (ALP): 67 U/L (ref 38–126)
Aspartate Transaminase (AST): 39 U/L (ref 9–44)
BUN/ Creatinine: 32.1 — ABNORMAL HIGH (ref 7.3–21.7)
Bilirubin Total: 0.4 mg/dL (ref 0.1–2.2)
Calcium: 9 mg/dL (ref 8.7–10.2)
Carbon Dioxide Total: 25 mmol/L (ref 22–32)
Chloride: 102 mmol/L (ref 99–109)
Creatinine Serum: 0.53 mg/dL (ref 0.50–1.30)
E-GFR, Non-African American: 121 mL/min/{1.73_m2} (ref >60)
E-GFR: 121 mL/min/{1.73_m2} (ref >60)
Globulin: 3.6 g/dL (ref 2.2–4.2)
Glucose: 162 mg/dL — ABNORMAL HIGH (ref 70–99)
Potassium: 3.7 mmol/L (ref 3.5–5.2)
Protein: 7.4 g/dL (ref 5.9–8.2)
Sodium: 135 mmol/L (ref 134–143)
Urea Nitrogen, Blood (BUN): 17 mg/dL (ref 6–21)

## 2017-12-08 LAB — HEMOGLOBIN A1C
HGB A1C,GLUCOSE EST AVG: 140 mg/dL
Hgb A1C: 6.5 % — ABNORMAL HIGH (ref 4.0–5.6)

## 2017-12-08 LAB — BILIRUBIN DIRECT: Bilirubin Direct: <0.1 mg/dL (ref <=0.5)

## 2017-12-08 LAB — MMC HEPATITIS PROFILE
Hepatitis A Ab, IgM: NONREACTIVE
Hepatitis B Core Ab, IgM: NONREACTIVE
Hepatitis B Surface Antigen: NONREACTIVE
Hepatitis C Ab Screen: NONREACTIVE

## 2017-12-08 LAB — GAMMA GLUTAMYL TRANSFERASE(GGT: Gamma Glutamyl Transferase(GTT): 24 U/L (ref 7–50)

## 2017-12-09 ENCOUNTER — Ambulatory Visit (HOSPITAL_BASED_OUTPATIENT_CLINIC_OR_DEPARTMENT_OTHER): Payer: Self-pay | Admitting: Family Medicine

## 2017-12-12 ENCOUNTER — Ambulatory Visit (HOSPITAL_BASED_OUTPATIENT_CLINIC_OR_DEPARTMENT_OTHER): Admitting: Family Medicine

## 2017-12-12 ENCOUNTER — Encounter (HOSPITAL_BASED_OUTPATIENT_CLINIC_OR_DEPARTMENT_OTHER): Payer: Self-pay | Admitting: Family Medicine

## 2017-12-12 VITALS — BP 134/80 | HR 96 | Temp 97.2°F | Resp 17 | Ht 64.0 in | Wt 190.8 lb

## 2017-12-12 DIAGNOSIS — I10 Essential (primary) hypertension: Secondary | ICD-10-CM

## 2017-12-12 DIAGNOSIS — E669 Obesity, unspecified: Secondary | ICD-10-CM

## 2017-12-12 DIAGNOSIS — E785 Hyperlipidemia, unspecified: Secondary | ICD-10-CM

## 2017-12-12 DIAGNOSIS — E1169 Type 2 diabetes mellitus with other specified complication: Secondary | ICD-10-CM

## 2017-12-12 DIAGNOSIS — Z1239 Encounter for other screening for malignant neoplasm of breast: Secondary | ICD-10-CM | POA: Insufficient documentation

## 2017-12-12 NOTE — Progress Notes (Signed)
HPI,    52 years old female patient presenting to the clinic today for a follow-up    DIABETES MELLITUS, reports her fasting blood sugars are mostly in 110s-120s  No hypoglycemic episodes  Is trying to get back into her walking and exercise regimen.  Has gained some weight    HYPERTENSION, blood pressure stable at home    HYPERLIPIDEMIA with LOW HDL, tolerating statins well.        Current Outpatient Medications:     Albuterol (PROAIR HFA) 90 mcg/actuation inhaler, Take 1-2 puffs by inhalation every 6 hours if needed., Disp: 8.5 g, Rfl: 1    FIBER, PSYLLIUM HUSK, PO,   , Disp: , Rfl:     L.acid/L.casei/B.bif/B.lon/FOS (PROBIOTIC BLEND PO), every morning., Disp: , Rfl:     Lisinopril (PRINIVIL, ZESTRIL) 2.5 mg Tablet, TAKE ONE TABLET BY MOUTH EVERY DAY, Disp: 90 tablet, Rfl: 1    Metformin (GLUCOPHAGE) 1,000 mg tablet, TAKE 1 TABLET BY MOUTH TWICE DAILY WITH MEALS, Disp: 180 tablet, Rfl: 1    MULTIVITAMIN PO, Twice a day, Disp: , Rfl:     MV-MIN/VIT C/GLUT/LYSINE/HC124 (AIRBORNE, WITH LYSINE ACETATE, PO), Seasonal       , Disp: , Rfl:     Simvastatin (ZOCOR) 10 mg Tablet, Take 1 tablet by mouth every day., Disp: 90 tablet, Rfl: 1       Review of Systems   Constitutional: Negative.  Negative for activity change, appetite change and fever.   HENT: Negative.    Eyes: Negative.    Respiratory: Negative.  Negative for cough, shortness of breath and wheezing.    Cardiovascular: Negative.    Gastrointestinal: Negative.    Endocrine: Negative for cold intolerance and heat intolerance.   Genitourinary: Negative.    Musculoskeletal: Negative.    Skin: Negative.    Neurological: Negative.    Hematological: Negative.    Psychiatric/Behavioral: Negative.  Negative for self-injury and sleep disturbance.       Physical Exam   Constitutional: She is oriented to person, place, and time. She appears well-developed.   HENT:   Head: Normocephalic and atraumatic.   Right Ear: External ear normal.   Left Ear: External ear normal.    Mouth/Throat: Oropharynx is clear and moist.   Eyes: Pupils are equal, round, and reactive to light. Conjunctivae are normal.   Neck: Neck supple. No JVD present. No thyromegaly present.   Cardiovascular: Normal rate, regular rhythm and normal heart sounds.   Pulmonary/Chest: Effort normal. No respiratory distress.   Abdominal: Soft. She exhibits no mass. There is no tenderness. There is no rebound.   Musculoskeletal: She exhibits no edema.   Neurological: She is alert and oriented to person, place, and time.   Skin: Skin is warm.   Nursing note and vitals reviewed.    PLAN,    DIABETES MELLITUS, A1c stable at 6.5.  Diabetes Mellitus - lab results are reviewed with the patient .   Diabetic education is reinforced . Low fat, low cholesterol diabetic diet .Regular exercise as tolerated .  Daily foot exams and annual eye exams .  Monitor blood sugars as discussed . Discussion about  Hypoglycemia and its management . Always carry glucose gels with you .    HYPERTENSION, stable.  Low-salt diet    DYSLIPIDEMIA, continue with a low-fat low-cholesterol diet.  Last lipid panel done 08/12/17 showed HDL to be low at 30.  Tolerating statins well.  Discussion about diet and exercise.    OTHERS, immunizations reviewed.  Has received infant's a vaccination this season  Due for mammogram.  Regular breast exams at home.

## 2017-12-12 NOTE — Patient Instructions (Signed)
DASH Diet: Care Instructions  Your Care Instructions  The DASH diet is an eating plan that can help lower your blood pressure. DASH stands for Dietary Approaches to Stop Hypertension. Hypertension is high blood pressure.  The DASH diet focuses on eating foods that are high in calcium, potassium, and magnesium. These nutrients can lower blood pressure. The foods that are highest in these nutrients are fruits, vegetables, low-fat dairy products, nuts, seeds, and legumes. But taking calcium, potassium, and magnesium supplements instead of eating foods that are high in those nutrients does not have the same effect. The DASH diet also includes whole grains, fish, and poultry.  The DASH diet is one of several lifestyle changes your doctor may recommend to lower your high blood pressure. Your doctor may also want you to decrease the amount of sodium in your diet. Lowering sodium while following the DASH diet can lower blood pressure even further than just the DASH diet alone.  Follow-up care is a key part of your treatment and safety. Be sure to make and go to all appointments, and call your doctor if you are having problems. It's also a good idea to know your test results and keep a list of the medicines you take.  How can you care for yourself at home?  Following the DASH diet   Eat 4 to 5 servings of fruit each day. A serving is 1 medium-sized piece of fruit,  cup chopped or canned fruit, 1/4 cup dried fruit, or 4 ounces ( cup) of fruit juice. Choose fruit more often than fruit juice.   Eat 4 to 5 servings of vegetables each day. A serving is 1 cup of lettuce or raw leafy vegetables,  cup of chopped or cooked vegetables, or 4 ounces ( cup) of vegetable juice. Choose vegetables more often than vegetable juice.   Get 2 to 3 servings of low-fat and fat-free dairy each day. A serving is 8 ounces of milk, 1 cup of yogurt, or 1  ounces of cheese.   Eat 6 to 8 servings of grains each day. A serving is 1 slice of  bread, 1 ounce of dry cereal, or  cup of cooked rice, pasta, or cooked cereal. Try to choose whole-grain products as much as possible.   Limit lean meat, poultry, and fish to 2 servings each day. A serving is 3 ounces, about the size of a deck of cards.   Eat 4 to 5 servings of nuts, seeds, and legumes (cooked dried beans, lentils, and split peas) each week. A serving is 1/3 cup of nuts, 2 tablespoons of seeds, or  cup of cooked beans or peas.   Limit fats and oils to 2 to 3 servings each day. A serving is 1 teaspoon of vegetable oil or 2 tablespoons of salad dressing.   Limit sweets and added sugars to 5 servings or less a week. A serving is 1 tablespoon jelly or jam,  cup sorbet, or 1 cup of lemonade.   Eat less than 2,300 milligrams (mg) of sodium a day. If you have high blood pressure, diabetes, or chronic kidney disease, if you are African-American, or if you are older than age 50, try to limit the amount of sodium you eat to less than 1,500 mg a day.  Tips for success   Start small. Do not try to make dramatic changes to your diet all at once. You might feel that you are missing out on your favorite foods and then be more likely   to not follow the plan. Make small changes, and stick with them. Once those changes become habit, add a few more changes.   Try some of the following:   Make it a goal to eat a fruit or vegetable at every meal and at snacks. This will make it easy to get the recommended amount of fruits and vegetables each day.   Try yogurt topped with fruit and nuts for a snack or healthy dessert.   Add lettuce, tomato, cucumber, and onion to sandwiches.   Combine a ready-made pizza crust with low-fat mozzarella cheese and lots of vegetable toppings. Try using tomatoes, squash, spinach, broccoli, carrots, cauliflower, and onions.   Have a variety of cut-up vegetables with a low-fat dip as an appetizer instead of chips and dip.   Sprinkle sunflower seeds or chopped almonds over salads.  Or try adding chopped walnuts or almonds to cooked vegetables.   Try some vegetarian meals using beans and peas. Add garbanzo or kidney beans to salads. Make burritos and tacos with mashed pinto beans or black beans.   Where can you learn more?   Go to https://www.healthwise.net/patiented  Enter H967 in the search box to learn more about "DASH Diet: Care Instructions."    2006-2015 [employer]. Care instructions adapted under license by [facility]. This care instruction is for use with your licensed healthcare professional. If you have questions about a medical condition or this instruction, always ask your healthcare professional. Healthwise, Incorporated disclaims any warranty or liability for your use of this information.  Content Version: 10.6.465758; Current as of: November 23, 2013

## 2017-12-30 ENCOUNTER — Other Ambulatory Visit (HOSPITAL_BASED_OUTPATIENT_CLINIC_OR_DEPARTMENT_OTHER): Payer: Self-pay | Admitting: Family Medicine

## 2018-01-02 NOTE — Telephone Encounter (Signed)
lov 3.11.19  Nov 6.11.19  bp 134/80    Alanine Transferase (ALT)   Date/Time Value Ref Range Status   12/08/2017 08:06 AM 51 4 - 56 U/L Final     Aspartate Transaminase (AST)   Date/Time Value Ref Range Status   12/08/2017 08:06 AM 39 9 - 44 U/L Final     Cholesterol   Date/Time Value Ref Range Status   08/12/2017 09:23 AM 165 112 - 200 mg/dL Final     LDL Cholesterol Calculation   Date/Time Value Ref Range Status   08/12/2017 09:23 AM 113 (H) <100 mg/dL Final     HDL Cholesterol   Date/Time Value Ref Range Status   08/12/2017 09:23 AM 30 (L) >=40 mg/dL Final     Triglyceride   Date/Time Value Ref Range Status   08/12/2017 09:23 AM 108 30 - 150 mg/dL Final

## 2018-01-05 ENCOUNTER — Other Ambulatory Visit: Payer: Self-pay | Admitting: Primary Care

## 2018-01-05 DIAGNOSIS — Z0000 Encounter for general adult medical examination without abnormal findings: Secondary | ICD-10-CM

## 2018-02-10 ENCOUNTER — Ambulatory Visit
Admission: RE | Admit: 2018-02-10 | Discharge: 2018-02-10 | Disposition: A | Discharge status: Home / Self Care | Payer: BLUE CROSS/BLUE SHIELD | Source: Ambulatory Visit | Attending: Primary Care | Admitting: Primary Care

## 2018-02-10 DIAGNOSIS — Z1231 Encounter for screening mammogram for malignant neoplasm of breast: Secondary | ICD-10-CM | POA: Insufficient documentation

## 2018-02-10 DIAGNOSIS — Z0000 Encounter for general adult medical examination without abnormal findings: Secondary | ICD-10-CM

## 2018-03-14 ENCOUNTER — Ambulatory Visit (HOSPITAL_BASED_OUTPATIENT_CLINIC_OR_DEPARTMENT_OTHER): Admitting: Family Medicine

## 2018-04-10 ENCOUNTER — Other Ambulatory Visit (HOSPITAL_BASED_OUTPATIENT_CLINIC_OR_DEPARTMENT_OTHER): Payer: Self-pay | Admitting: Family Medicine

## 2018-04-11 NOTE — Telephone Encounter (Signed)
Pt called stating that she is on disability until January 2020. Pt is trying to apply for [employer] insurance- she would like to know if you would be willing to do a 3 month supply of her medications until she can get insurance.

## 2018-04-11 NOTE — Telephone Encounter (Signed)
lov 3.11.19  No f/u  Hgb A1C   Date/Time Value Ref Range Status   12/08/2017 08:06 AM 6.5 (H) 4.0 - 5.6 % Final

## 2018-04-11 NOTE — Telephone Encounter (Signed)
Pt just needs metformin filled for 90 days which was sent today. I informed pt to make sure she makes a f/u when she can.

## 2018-04-11 NOTE — Telephone Encounter (Signed)
Due for FU

## 2018-08-18 ENCOUNTER — Telehealth (HOSPITAL_BASED_OUTPATIENT_CLINIC_OR_DEPARTMENT_OTHER): Payer: Self-pay | Admitting: Family Medicine

## 2018-08-18 NOTE — Telephone Encounter (Signed)
Informed pt. Pt states she is not back to work and does not have insurance right now so she cannot afford labs and office visits. Pt states she will call to schedule once she is back to work.

## 2018-08-18 NOTE — Telephone Encounter (Signed)
-----   Message from Lurene ShadowGurmeet Kuar Talwar, MD sent at 08/18/2018  2:33 PM PST -----  Overdue for FU on DM - has labs in system  Please schedule with me for 30 mt

## 2018-10-19 ENCOUNTER — Other Ambulatory Visit: Payer: Self-pay | Admitting: Primary Care

## 2018-10-19 DIAGNOSIS — Z1231 Encounter for screening mammogram for malignant neoplasm of breast: Secondary | ICD-10-CM

## 2019-11-27 ENCOUNTER — Other Ambulatory Visit (HOSPITAL_BASED_OUTPATIENT_CLINIC_OR_DEPARTMENT_OTHER): Payer: Self-pay | Admitting: Family Medicine

## 2019-11-27 DIAGNOSIS — Z1231 Encounter for screening mammogram for malignant neoplasm of breast: Secondary | ICD-10-CM

## 2019-12-13 ENCOUNTER — Other Ambulatory Visit: Payer: Self-pay | Admitting: Primary Care

## 2019-12-13 DIAGNOSIS — Z1231 Encounter for screening mammogram for malignant neoplasm of breast: Secondary | ICD-10-CM

## 2020-01-18 ENCOUNTER — Ambulatory Visit
Admission: RE | Admit: 2020-01-18 | Discharge: 2020-01-18 | Disposition: A | Discharge status: Home / Self Care | Payer: Managed Care, Other (non HMO) | Source: Ambulatory Visit | Attending: Primary Care | Admitting: Primary Care

## 2020-01-18 DIAGNOSIS — Z1231 Encounter for screening mammogram for malignant neoplasm of breast: Secondary | ICD-10-CM

## 2020-03-13 ENCOUNTER — Ambulatory Visit (HOSPITAL_BASED_OUTPATIENT_CLINIC_OR_DEPARTMENT_OTHER): Admitting: Internal Medicine

## 2020-03-13 ENCOUNTER — Encounter (HOSPITAL_BASED_OUTPATIENT_CLINIC_OR_DEPARTMENT_OTHER): Payer: Self-pay | Admitting: NURSE PRACTITIONER

## 2020-03-13 ENCOUNTER — Encounter (HOSPITAL_BASED_OUTPATIENT_CLINIC_OR_DEPARTMENT_OTHER): Payer: Self-pay | Admitting: Internal Medicine

## 2020-03-13 VITALS — BP 154/96 | HR 81 | Temp 97.5°F | Resp 18 | Ht 64.0 in | Wt 186.0 lb

## 2020-03-13 DIAGNOSIS — E1169 Type 2 diabetes mellitus with other specified complication: Secondary | ICD-10-CM

## 2020-03-13 DIAGNOSIS — M25511 Pain in right shoulder: Secondary | ICD-10-CM

## 2020-03-13 DIAGNOSIS — Z862 Personal history of diseases of the blood and blood-forming organs and certain disorders involving the immune mechanism: Secondary | ICD-10-CM

## 2020-03-13 DIAGNOSIS — G4700 Insomnia, unspecified: Secondary | ICD-10-CM

## 2020-03-13 DIAGNOSIS — G8929 Other chronic pain: Secondary | ICD-10-CM

## 2020-03-13 DIAGNOSIS — I10 Essential (primary) hypertension: Secondary | ICD-10-CM

## 2020-03-13 DIAGNOSIS — M222X1 Patellofemoral disorders, right knee: Secondary | ICD-10-CM

## 2020-03-13 DIAGNOSIS — E785 Hyperlipidemia, unspecified: Secondary | ICD-10-CM

## 2020-03-13 DIAGNOSIS — E669 Obesity, unspecified: Secondary | ICD-10-CM

## 2020-03-13 DIAGNOSIS — J301 Allergic rhinitis due to pollen: Secondary | ICD-10-CM

## 2020-03-13 DIAGNOSIS — Z8261 Family history of arthritis: Secondary | ICD-10-CM

## 2020-03-13 NOTE — Progress Notes (Signed)
Amanda Williamson is a 70yr female is here to establish care. She is a previous pt of Dr. Charmayne Sheer, then followed by Gerrit Heck due to lack of insurance.     She elicits regular follow up in terms of DM, which she states her last Hemoglobin A1C  - last was 6.1% 09/2019. She continues to work on diet and exercise, however exercise is difficult given her chronic shoulder and knee pain.    Today she elicits lower extremity swelling, uncertain if this is due to her chronic pain; denies any HA, dizziness, CP or other symptoms of acute cardiopulmonary processes. She is uncertain if she has been tested for autoimmune disease, however states her pain is bilateral and does not improve with massage or other symptomatic treatment. She also elicits R hand swelling and numbness; whole R knee swelling; no recent injuries or mechanical falls.    Health maintenance including last mammogram - 08/2019 - normal, PAP smear around 08/2019, also normal. Pt's colonoscopy normal, uncertain of what year it was performed.    Pt is expressing inquiry pertaining to her disability. She states that she began receiving disability in December, which was discontinued in April.     Review of Systems   Constitutional: Positive for activity change. Negative for appetite change, fever and unexpected weight change.   HENT: Negative.    Respiratory: Negative.    Cardiovascular: Negative.    Gastrointestinal: Negative.    Endocrine: Negative.    Musculoskeletal: Positive for arthralgias, back pain, joint swelling, myalgias and neck pain. Negative for gait problem and neck stiffness.   Skin: Negative.    Neurological: Negative.    Psychiatric/Behavioral: Negative.        Past Medical History:   Diagnosis Date    Diabetes ([place])     Erythema nodosum     H/O mammogram 09/10/2016    Kidney stones     PFS (patellofemoral syndrome)     Knee pain    RAD (reactive airway disease)          Current Outpatient Medications:     Albuterol (PROAIR HFA) 90 mcg/actuation  inhaler, Take 1-2 puffs by inhalation every 6 hours if needed., Disp: 8.5 g, Rfl: 1    FIBER, PSYLLIUM HUSK, PO,   , Disp: , Rfl:     Iron 18 mg Tablet, Take by mouth., Disp: , Rfl:     L.acid/L.casei/B.bif/B.lon/FOS (PROBIOTIC BLEND PO), once daily if needed.       , Disp: , Rfl:     Lisinopril (PRINIVIL, ZESTRIL) 2.5 mg Tablet, TAKE 1 TABLET BY MOUTH EVERY DAY, Disp: 90 tablet, Rfl: 1    MAGNESIUM PO, Take by mouth., Disp: , Rfl:     Metformin (GLUCOPHAGE) 1,000 mg tablet, TAKE 1 TABLET BY MOUTH TWICE DAILY WITH MEALS, Disp: 180 tablet, Rfl: 1    multivit,calc,mins/iron/folic (ONE-A-DAY WOMENS FORMULA PO), Take by mouth., Disp: , Rfl:     MULTIVITAMIN PO, Twice a day, Disp: , Rfl:     MV-MIN/VIT C/GLUT/LYSINE/HC124 (AIRBORNE, WITH LYSINE ACETATE, PO), Seasonal       , Disp: , Rfl:     naproxen sodium (ALEVE PO), Take by mouth 2 times daily., Disp: , Rfl:     Simvastatin (ZOCOR) 10 mg Tablet, Take 1 tablet by mouth every day., Disp: 90 tablet, Rfl: 1    TURMERIC PO, Take by mouth every day., Disp: , Rfl:     No Known Allergies    Past Surgical History:   Procedure Laterality  Date    COLONOSCOPY  07/08/2012    WNL    COLONOSCOPY  02/24/2015    ESOPHAGOGASTRODUODENOSCOPY (EGD)  02/24/2015    LIGATION, FALLOPIAN TUBE, BILATERAL, USING TUBAL RING  1994       Patient Active Problem List   Diagnosis    RAD (reactive airway disease)    Erythema nodosum    PFS (patellofemoral syndrome)    Diabetes mellitus type 2 in obese (HCC)    HTN (hypertension)    Dyslipidemia    Colon polyps    Breast cancer screening       Social History     Tobacco Use    Smoking status: Never Smoker    Smokeless tobacco: Never Used   Substance Use Topics    Alcohol use: No       Family History   Problem Relation Name Age of Onset    Arthritis Mother          Rheumitoid    Lipids Mother      Diabetes Father      Other (Other) Father          Kidney faliure    No Known Problems Sister      No Known Problems Brother       No Known Problems Brother      No Known Problems Brother      No Known Problems Daughter      No Known Problems Daughter      No Known Problems Daughter      Other (Other) Son          pulmonary    Diabetes Son          childhood        OB History   Gravida Para Term Preterm AB Living   5 5 5  <na> <na> 4   SAB TAB Ectopic Multiple Live Births   <na> <na> <na> <na> <na>       BP (!) 154/96 (SITE: left arm, Orthostatic Position: sitting, Cuff Size: large)   Pulse 81   Temp 36.4 C (97.5 F) (Temporal)   Resp 18   Ht 1.626 m (5\' 4" )   Wt 84.4 kg (186 lb)   SpO2 97%   BMI 31.93 kg/m   Body mass index is 31.93 kg/m.      GENERAL: Well developed, well nourished female, in no acute distress.  HEAD:  Head is normal in shape, size with no gross abnormalities  EYES:  The eyes are normal in appearance. PERRLA/EOM are intact.  Fundi are normal and benign.  EARS:  The canals are free of debris or inflammation.  The tympanic membranes are normal in color with good landmarks and normal light reflex.  Hearing is grossly normal.  NOSE:  The nasal mucosa is normal without congestion, no abnormal discharge is present, the septum is midline.   THROAT:  The throat is clear without redness, exudates, petechiae or edema.   NECK: TTP along trapezius m and cervical spine  LUNGS:  Respiratory effort is normal without respiratory distress. The lungs are clear to auscultation.   HEART:  The heart is regular without murmur lifts, thrills, or heaves. No S3 or S4 is present. There is no jugular venous distention.   ABDOMEN: The abdomen is soft and benign, without masses, organomegaly or tenderness.  Bowel sounds are normal without bruit. There is no tenderness, guarding, spasm or other signs of peritoneal irritation.   MSK: R shoulder TTP; R  knee minimally swollen  SKIN: The skin is clear without rash, irritation, abnormal nevi, eczema, or abnormal discoloration.    A/P  1. Diabetes mellitus type 2 in obese (HCC)    2.  Essential hypertension    3. Dyslipidemia    4. Patellofemoral pain syndrome of right knee    5. Hx of iron deficiency anemia    6. Insomnia, unspecified type    7. Family history of rheumatoid arthritis    8. Chronic right shoulder pain    9. Seasonal allergic rhinitis due to pollen    10. Obesity (BMI 30-39.9)      -Pt due for fasting labs, will obtain to ensure DM well controlled  -Pending lab results, may consider referral to DNE given pt's elevated BMI  -BP noticeably elevated today; keep BP log, return to office in 2-4 weeks; alarming symptoms and ED precautions reviewed  -If persistently elevated, consider increasing lisinopril to 10 mg  -Given pt's previous disability status and chronic pain, I have a low suspicion pertaining to autoimmune dz being contributory however will obtain ANA lab as screening  -Referral to PT for chronic knee and shoulder pain  -Melatonin or Unisom for sleep  -RTO in 2-4 weeks for BP check, then 3 months for routine follow up, sooner if needed.    Progress notes reviewed and authenticated by Omelia Blackwater, PA-C 03/13/2020 10:58 AM

## 2020-03-14 ENCOUNTER — Ambulatory Visit: Attending: Family Medicine

## 2020-03-14 DIAGNOSIS — E1169 Type 2 diabetes mellitus with other specified complication: Secondary | ICD-10-CM | POA: Insufficient documentation

## 2020-03-14 DIAGNOSIS — E785 Hyperlipidemia, unspecified: Secondary | ICD-10-CM | POA: Insufficient documentation

## 2020-03-14 DIAGNOSIS — E669 Obesity, unspecified: Secondary | ICD-10-CM | POA: Insufficient documentation

## 2020-03-14 DIAGNOSIS — G4700 Insomnia, unspecified: Secondary | ICD-10-CM | POA: Insufficient documentation

## 2020-03-14 DIAGNOSIS — Z862 Personal history of diseases of the blood and blood-forming organs and certain disorders involving the immune mechanism: Secondary | ICD-10-CM | POA: Insufficient documentation

## 2020-03-14 DIAGNOSIS — Z8261 Family history of arthritis: Secondary | ICD-10-CM | POA: Insufficient documentation

## 2020-03-14 DIAGNOSIS — I10 Essential (primary) hypertension: Secondary | ICD-10-CM | POA: Insufficient documentation

## 2020-03-14 LAB — LIPID PANEL
Cholesterol: 123 mg/dL (ref 112–200)
HDL Cholesterol: 36 mg/dL — ABNORMAL LOW (ref >=40)
LDL Cholesterol Calculation: 63 mg/dL (ref <100)
Non-HDL Cholesterol: 87 mg/dL (ref <=150.0)
Total Cholesterol: HDL Ratio: 3.4 mg/dL (ref 2.0–5.0)
Triglyceride: 120 mg/dL (ref 30–150)

## 2020-03-14 LAB — COMPREHENSIVE METABOLIC PANEL
Adjusted Calcium: 9.2 mg/dL (ref 8.7–10.2)
Alanine Transferase (ALT): 26 U/L (ref 4–56)
Alb/Glob Ratio: 1.1 (ref 1.0–1.6)
Albumin: 4 g/dL (ref 3.2–4.7)
Alkaline Phosphatase (ALP): 58 U/L (ref 38–126)
Aspartate Transaminase (AST): 25 U/L (ref 9–44)
BUN/ Creatinine: 21.4 (ref 7.3–21.7)
Bilirubin Total: 0.5 mg/dL (ref 0.1–2.2)
Calcium: 9.2 mg/dL (ref 8.7–10.2)
Carbon Dioxide Total: 27 mmol/L (ref 22–32)
Chloride: 101 mmol/L (ref 99–109)
Creatinine Serum: 0.7 mg/dL (ref 0.50–1.30)
E-GFR, Non-African American: 99 mL/min/{1.73_m2} (ref >60)
E-GFR: 114 mL/min/{1.73_m2} (ref >60)
Globulin: 3.6 g/dL (ref 2.2–4.2)
Glucose: 115 mg/dL — ABNORMAL HIGH (ref 70–99)
Potassium: 4.5 mmol/L (ref 3.5–5.2)
Protein: 7.6 g/dL (ref 5.9–8.2)
Sodium: 138 mmol/L (ref 134–143)
Urea Nitrogen, Blood (BUN): 15 mg/dL (ref 6–21)

## 2020-03-14 LAB — CBC WITH DIFFERENTIAL
Basophils % Auto: 0.7 % (ref 0.0–1.0)
Basophils Abs Auto: 0 10*3/uL (ref 0.0–0.1)
Eosinophils % Auto: 7.5 % — ABNORMAL HIGH (ref 0.0–4.0)
Eosinophils Abs Auto: 0.4 10*3/uL — ABNORMAL HIGH (ref 0.0–0.2)
Hematocrit: 38.4 % (ref 36.0–48.0)
Hemoglobin: 13 g/dL (ref 12.0–16.0)
Immature Granulocytes % Auto: 0.2 % (ref 0.00–0.50)
Immature Granulocytes Abs Auto: 0 10*3/uL (ref 0.0–0.0)
Lymphocytes % Auto: 33.8 % (ref 5.0–41.0)
Lymphocytes Abs Auto: 1.9 10*3/uL (ref 1.3–2.9)
MCH: 30.9 pg (ref 27.0–34.0)
MCHC g/dL: 33.9 g/dL (ref 33.0–37.0)
MCV: 91.2 fL (ref 82.0–97.0)
MPV: 11 fL (ref 9.4–12.4)
Monocytes % Auto: 7.4 % (ref 0.0–10.0)
Monocytes Abs Auto: 0.4 10*3/uL (ref 0.3–0.8)
Neutrophils % Auto: 50.4 % (ref 45.0–75.0)
Neutrophils Abs Auto: 2.88 10*3/uL (ref 2.20–4.80)
Nucleated Cell Count: 0 10*3/uL (ref 0.0–0.1)
Nucleated RBC/100 WBC: 0 % WBC (ref <=0.0)
Platelet Count: 237 10*3/uL (ref 151–365)
RDW: 13.2 % (ref 11.5–14.5)
Red Blood Cell Count: 4.21 10*6/uL (ref 3.80–5.10)
White Blood Cell Count: 5.7 10*3/uL (ref 4.2–10.8)

## 2020-03-14 LAB — MMC IRON STUDIES WITH FERRITIN
Ferritin: 34 ng/mL (ref 11–307)
Iron Percent Saturation: 16 % (ref 11–46)
Iron Total: 63 ug/dL (ref 28–170)
Total Iron Binding Capacity: 383 ug/dL (ref 261–478)
Transferrin: 257 mg/dL (ref 192–382)

## 2020-03-14 LAB — MMC THYROID PANEL
Thyroid Stimulating Hormone: 5.09 u[IU]/mL (ref 0.45–5.33)
Thyroxine, Free (Free T4): 0.8 ng/dL (ref 0.6–1.6)

## 2020-03-14 LAB — C-REACTIVE PROTEIN: C-Reactive Protein: 0.2 mg/dL (ref <=1.0)

## 2020-03-14 LAB — HEMOGLOBIN A1C
Hgb A1C,Glucose Est Avg: 154 mg/dL
Hgb A1C: 7 % — ABNORMAL HIGH (ref 4.0–5.6)

## 2020-03-17 LAB — ANTI-NUCLEAR AB SCREEN (ANA): Antinuclear Ab: NEGATIVE

## 2020-03-19 NOTE — Progress Notes (Signed)
Inform pt. That her hemoglobin A1C is now 7.0%, increased from 6.5% and fasting glucose is now 115, down from 162. I would like her to continue to work on her diet, we will discuss the remainder of her lab results are her upcoming appointment. -ARL.

## 2020-03-25 ENCOUNTER — Ambulatory Visit
Attending: Rehabilitative and Restorative Service Providers" | Admitting: Rehabilitative and Restorative Service Providers"

## 2020-03-25 DIAGNOSIS — G8929 Other chronic pain: Secondary | ICD-10-CM | POA: Insufficient documentation

## 2020-03-25 DIAGNOSIS — M25511 Pain in right shoulder: Secondary | ICD-10-CM | POA: Insufficient documentation

## 2020-03-25 NOTE — Allied Health Progress (Signed)
PHYSICAL THERAPY EVALUATION 03/25/2020   Covid -19 patient screen completed,no fever, temperature 96.110F, no SOB, no sore throat, no vomiting or diarrhea, no cough,no chills,no loss of taste or smell,no known exposure to others with symptoms. Patient is wearing a mask.      Diagnosis:  Therapy diagnosis: M25.511,G89.29 (ICD-10-CM) - Chronic right shoulder pain  Medical diagnosis: M25.511,G89.29 (ICD-10-CM) - Chronic right shoulder pain    Authorizing MD (First and last name) and PI#: Gordy Levan PA  Onset Date: 03/13/2020   Start of Care Date: 03/25/2020   Prior Level of function: has been limited with use of right arm ever since right rotator cuff repair surgery on 02/23/2018. Never regained reasonable ROM and strength in right shoulder. Does housework but needs to stop frequently to rest due to increased pain.  Prior treatment for this diagnosis: yes  Rehab potential: Fair    Precautions: none        Plan of care:  Established 03/25/2020   1 times a week for a total of 12 treatments.   To include   Manual therapy for soft tissue mobilization, joint mobilization cervical spine and right glenohumeral joint, neurodynamic mobilization  Therapeutic exercises for stretches, ROM exercises and strengthening exercises to neck and right shoulder.  Pain neuroscience education.  Home Exercise Program  Patient/Caregiver Education    Goals:     Patient will be able to sleep on right shoulder for 5-10 minutes before having to change sides  Patient will be able to flex right shoulder to 120 degrees to don/doff shirt  Patient  will be able to externally rotate right shoulder to 45 degrees while elevated to 110 degrees to reach back of head to wash and brush hair  Patient will be independent with home exercise program and home recommendations   Able to use right arm to carry groceries into her home.          Total Evaluation time: 35 minutes  Treatment time in addition to evaluation: 25 minutes           Clinical Evaluation      SUBJECTIVE  History of current problem:                          Amanda Williamson is a 4yr female who presents with a history of  chronic right shoulder pain. Had right rotator cuff repair and acromioplasty on 02/23/2018, last had physical therapy February 2020 and had to stop due to Covid 19.She is complaining of constant aching and intermittent sharp pain over her right cervical and upper trapezius and right shoulder rated 8/10 at worst and 5-6/10 at best.She gets intermittent shooting pains down her entire right UE to the wrist rated 8/10 at worst. She gets numbness over her right middle, ring and little fingers.Still experiencing significant right shoulder and UE pain, right shoulder weakness and decreased ROM.  She is waking up from her sleep 3-4 times a week and sometimes has great difficulty falling asleep.    Aggravating factors include: washing hair with right hand, cooking, lifting pots, right sidelying  Easing factors include:     Current level of function: cannot lift groceries into house, daughter's carry groceries in, drives very little, does housework in 15-20 minute bouts.    Medical History/Co-morbidities effecting the plan of care: see below      Past Medical History:  No date: Diabetes (HCC)  No date: Erythema nodosum  09/10/2016: H/O mammogram  No  date: Kidney stones  No date: PFS (patellofemoral syndrome)  No date: RAD (reactive airway disease)    Past Surgical History:   Procedure Laterality Date    COLONOSCOPY  07/08/2012    WNL    COLONOSCOPY  02/24/2015    ESOPHAGOGASTRODUODENOSCOPY (EGD)  02/24/2015    LIGATION, FALLOPIAN TUBE, BILATERAL, USING TUBAL RING  1994             Social History/Personal and/or environmental factors effecting the plan of care:   married, 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      OBJECTIVE EXAM:       Posture: Patient shoulders  protracted, with forward head posture during sit and standing tasks      Palpation:   Very tender over right anterior, lateral and posterior shoulder. Tender over right posterior cervical paraspinals, upper trapezius, levator scapula.  Tender over right wrist extensor origin and wrist extensor muscles, tender over right distal wrist flexor and extensor tendons.    Range of motion: (in degrees) Not tested if left blank    Active  Passive  Normal    Left Right Left Right    Flexion 135 pain and stiff 90   pain++   180   Extension NT NT   45   Abduction 105 pain and stiff 90   pain++   170   Internal rotation 70 30  Pain+   90   External rotation 60 pain and stiff 10 pain++   90     Horizontal adduction right 0 degrees, left 15 degrees pain  Hand behind head left middle finger toT1, right unable.  Hand behind backleft thumb to T9, right unable              AROM right elbow and wrisrt WFL's but with pain.  AROM cervical spine flexion WFL's no increase in cervical  pain                                        Extension WFL's  Increased right cervical and upper trapezius pain  Left rotation 50 degrees mild increase in cervical pain    Right rotation equals 35 degrees moderate  increase in right cervical pain  Left lateral flexion equals right lateral flexion equals 10 degrees stiff and moderate increase in right cervical pain.    Strength:   SHOULDER Left Right   Flexion 5 3+   Abduction 5 3+   Internal rotation 5 4   External rotation 5 3   Extension  5 3     Supraspinatus left 5/5, right 3-/5  Rhomboids and middle trapezius left 4-/5, right 3/5    Sensation/ Neurological screens:   Decreased to light touch right hand.  Positive neurodynamic testing for right ulnar and median nerves      Treatment today in addition to evaluation(if applicable):    Manual therapy 25 minutes for soft tissue mobilization to right cervical paraspinals, upper trapezius, levator scapula, rhomboids and sternocleidomastoid.    Patient was  educated in the anatomy and physiology involved.  Common symptoms, treatments, responses to treatment, and self management were discussed with the patient.       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 Benefit from physical therapy for manual therapy for soft tissue mobilization, joint mobilization cervical spine and right glenohumeral joint, neurodynamic mobilization, therapeutic exercises for stretches, ROM exercises and strengthening exercises to neck and right shoulder and pain neuroscience education to increase ROM and strength right shoulder and decrease pain so patient has improved tolerance to activities using her right shoulder.         Clinical Presentation: Evolving with changing characteristics    The components of this evaluation necessitated a Low complexity level of clinical decision making.      Plan for next visit: continue STM.      Patient Education:      Method of Teaching: demonstration, verbal  and written hand-out     Learner: patient     Response: verbalizes understanding and able to give return demonstration       Has the plan of care been explained to the patient/caregiver? yes       Is the patient able to understand the plan of care: yes       Does the patient/caregiver(s) agree with the plan of care: yes       Are there barriers to learning? no.         Motivated to learn? yes       Best learning method: verbal, or demo      Patient/Family participation and agreement with recommendations: verbalized agreement    Is patient currently receiving outside care for this problem:     Home Health care services: no  Skilled nursing facility: no    Other no    Hulan Fray, PT  Treatment start time:1530                              Treatment stop time: 3335

## 2020-04-03 ENCOUNTER — Ambulatory Visit: Attending: Internal Medicine | Admitting: Rehabilitative and Restorative Service Providers"

## 2020-04-03 DIAGNOSIS — M25511 Pain in right shoulder: Secondary | ICD-10-CM | POA: Insufficient documentation

## 2020-04-03 DIAGNOSIS — G8929 Other chronic pain: Secondary | ICD-10-CM

## 2020-04-03 NOTE — Allied Health Progress (Signed)
Physical Therapy Treatment Note: 04/03/2020   Covid -19 patient screen completed no fever, temperature96.54F, no SOB, no sore throat, no vomiting or diarrhea, no cough, no chills, no loss of taste or smell, no known exposure to others with symptoms. Patient is wearing a mask.    Treatment start time:904Treatment stop time: 931          Total # of visits: 2/12    Total Treatment Time: 27 minutes        Subjective: c/o increased right shoulder pain after last treatment.    Objective: Patient seen for the following treatment:   Therapeutic exercises 12 minutes for stretches to right shoulder to increase flexion, ER/abduction and ER.  Manual therapy 15 minutes for soft tissue mobilization to right upper trapezius, levator scapulae, rhomboids and deltoids.       Assessment: very painful to stretch right shoulder into flexion and ER.    Plan: continue physical therapy - as per plan of care.     Amanda Williamson, PT

## 2020-04-15 ENCOUNTER — Ambulatory Visit: Admitting: Rehabilitative and Restorative Service Providers"

## 2020-04-15 ENCOUNTER — Ambulatory Visit (HOSPITAL_BASED_OUTPATIENT_CLINIC_OR_DEPARTMENT_OTHER): Admitting: Internal Medicine

## 2020-04-15 VITALS — BP 132/81 | HR 81 | Temp 97.5°F | Resp 18 | Wt 182.0 lb

## 2020-04-15 DIAGNOSIS — M7501 Adhesive capsulitis of right shoulder: Secondary | ICD-10-CM

## 2020-04-15 DIAGNOSIS — M25511 Pain in right shoulder: Secondary | ICD-10-CM

## 2020-04-15 DIAGNOSIS — J302 Other seasonal allergic rhinitis: Secondary | ICD-10-CM

## 2020-04-15 DIAGNOSIS — G8929 Other chronic pain: Secondary | ICD-10-CM

## 2020-04-15 DIAGNOSIS — E669 Obesity, unspecified: Secondary | ICD-10-CM

## 2020-04-15 DIAGNOSIS — Z9889 Other specified postprocedural states: Secondary | ICD-10-CM

## 2020-04-15 DIAGNOSIS — I10 Essential (primary) hypertension: Secondary | ICD-10-CM

## 2020-04-15 DIAGNOSIS — E1169 Type 2 diabetes mellitus with other specified complication: Secondary | ICD-10-CM

## 2020-04-15 DIAGNOSIS — E785 Hyperlipidemia, unspecified: Secondary | ICD-10-CM

## 2020-04-15 DIAGNOSIS — M7541 Impingement syndrome of right shoulder: Secondary | ICD-10-CM

## 2020-04-15 MED ORDER — METFORMIN 1,000 MG TABLET
1000.0000 mg | ORAL_TABLET | Freq: Two times a day (BID) | ORAL | 1 refills | Status: DC
Start: 2020-04-15 — End: 2020-10-13

## 2020-04-15 MED ORDER — FLUTICASONE PROPIONATE 50 MCG/ACTUATION NASAL SPRAY,SUSPENSION
NASAL | 3 refills | Status: AC
Start: 2020-04-15 — End: 2023-03-11

## 2020-04-15 MED ORDER — SIMVASTATIN 10 MG TABLET: 10.0000 mg | ORAL_TABLET | Freq: Every day | ORAL | 1 refills | Status: DC

## 2020-04-15 MED ORDER — LISINOPRIL 2.5 MG TABLET: 2.5000 mg | ORAL_TABLET | Freq: Every day | ORAL | 1 refills | Status: DC

## 2020-04-15 NOTE — Allied Health Progress (Signed)
Physical Therapy Treatment Note: 04/15/2020   Covid -19 patient screen completed no fever, temperature97.7F, no SOB, no sore throat, no vomiting or diarrhea, no cough, no chills, no loss of taste or smell, no known exposure to others with symptoms. Patient is wearing a mask.    Treatment start time:1115Treatment stop time: 1138            Total # of visits: 3/12    Total Treatment Time: 23 minutes      Subjective: patient was delayed for PT appointment as PA appointment went longer than expected.    Objective: Patient seen for the following treatment:   Therapeutic exercises 10 minutes for stretches to right shoulder to increase flexion, ER/abduction and ER.  Manual therapy 13 minutes for soft tissue mobilization to right upper trapezius, levator scapulae, rhomboids and deltoids.   ROM right shoulder ER in 90 degrees abduction 45 degrees, flexion 110 degrees with pain      Assessment: Improved ROM right shoulder but still pain.    Plan: continue physical therapy - as per plan of care.     Cala Bradford, PT

## 2020-04-15 NOTE — Progress Notes (Signed)
Family Practice Clinic Visit     Date: 04/15/2020  Patient Name: Amanda Williamson    CHIEF COMPLAINT:   Chief Complaint   Patient presents with    Test Results     BLOOD PRESSURE FOLLOW UP        Patient WAS wearing a cloth mask during visit  Contact precautions were followed when caring for the patient.   PPE used by provider during encounter: procedure mask      HISTORY OF PRESENT ILLNESS:    Pt states she has been doing her best to be consistent with her BP readings, which average readings are 130's/80's. She denies any HA, dizziness, CP, SOB or other symptoms of acute cardiopulmonary processes today. She is requesting refills for her HTN, HLD, and DM medications today.  Patient most recent laboratory results were also reviewed today, which indicate improved fasting glucose, however hemoglobin A1c has increased from 6.5% up to 7.0%.  Patient's lipid panel has also improved, however HDL remains decreased at 36 mg/dL. All other laboratory results including CRP, ANA, thyroid, and iron panel within normal limits.  Acutely patient states that she has been experiencing a nonproductive cough that is worse in the evening and morning.  She denies any nasal symptoms, nasal congestion, or other systemic symptoms of infection.    Patient states that she has been without disability since approximately 1 year ago, which she is inquiring if disability paperwork can be completed by our office.  Patient states that she got injured at her previous workplace, which resulted in her sustaining a complete rotator cuff tear affecting her right shoulder.  Patient underwent arthroscopic repair of her right rotator cuff, continued with physical therapy, and was subsequently let by her employer.  Today patient states that she continues to experience right shoulder pain with elements of frozen shoulder as she is unable to raise her right upper extremity greater than 90 degrees.  She is uncertain if additional physical therapy will provide  her additional symptomatic relief, which she does not take any chronic medications to address at this time.        Review of Systems   Constitutional: Positive for activity change. Negative for appetite change, fever and unexpected weight change.   HENT: Positive for postnasal drip. Negative for congestion, rhinorrhea, sinus pain, sneezing and sore throat.    Respiratory: Positive for cough.    Cardiovascular: Negative.    Gastrointestinal: Negative.    Endocrine: Negative.    Musculoskeletal: Positive for arthralgias, back pain, joint swelling, myalgias and neck pain. Negative for gait problem and neck stiffness.   Skin: Negative.    Neurological: Negative.    Psychiatric/Behavioral: Negative.        Note: This is a self-reported Review of Systems.    Vitals: BP 132/81 (SITE: left arm, Orthostatic Position: sitting, Cuff Size: large)   Pulse 81   Temp 36.4 C (97.5 F) (Temporal)   Resp 18   Wt 82.6 kg (182 lb)   SpO2 99%   BMI 31.24 kg/m   No LMP recorded. Patient is perimenopausal.    Physical Exam  Constitutional:       Appearance: Normal appearance.   HENT:      Nose:      Comments: Inferior nasal turbinates erythematous with hypertrophy, 3+ bilaterally.     Mouth/Throat:      Comments: Cobblestoning visualized in posterior oropharynx.  Cardiovascular:      Rate and Rhythm: Normal rate and regular rhythm.  Pulses: Normal pulses.      Heart sounds: Normal heart sounds.   Pulmonary:      Effort: Pulmonary effort is normal.      Breath sounds: Normal breath sounds.   Musculoskeletal:      Comments: Decreased range of motion and strength with right shoulder rotation   Neurological:      Mental Status: She is alert.   Psychiatric:         Mood and Affect: Mood normal.         Behavior: Behavior normal.          ASSESSMENT AND PLAN:    ICD-10-CM    1. Diabetes mellitus type 2 in obese (HCC)  E11.69 Metformin (GLUCOPHAGE) 1,000 mg tablet    E66.9 OPHTHALMOLOGY CLINIC REFERRAL   2. Essential hypertension   I10 Lisinopril (PRINIVIL, ZESTRIL) 2.5 mg Tablet   3. Dyslipidemia  E78.5 Simvastatin (ZOCOR) 10 mg Tablet   4. Seasonal allergies  J30.2 Fluticasone (FLONASE) 50 mcg/actuation nasal spray   5. Adhesive capsulitis of right shoulder  M75.01    6. Impingement syndrome of right shoulder  M75.41    7. History of arthroscopic surgery of shoulder  Z98.890       -Continue with chronic medications includingMetformin 1000 mg twice daily to address diabetes.  -Patient greatly benefit from further diabetes education to address her diet and exercise given her hemoglobin A1c has increased since previous drawn on 12/08/2017.  -HLD and HTN stable, continue with lisinopril 2.5 mg tablet and simvastatin 10 mg tablet.  No myalgias reported.  -Start fluticasone nasal spray 1 spray each nostril twice daily to address seasonal allergies causing postnasal drainage.  If no improvement, consider addition of Astelin intranasal antihistamine or oral antihistamine such as Zyrtec or Claritin.  -Although patient's previous physical therapy completed with [facility] is within patient's medical record, strongly advise obtaining previous records pertaining to her shoulder injury and previous surgical intervention.  I am uncertain at this time if patient qualifies for temporary versus permanent disability as shoulder trauma was not evaluated and/or treated under our medical system.  As this was addressed previously by [employer] and required litigation, may require additional consultation by appropriate provider.  -Return in 3 months for routine follow-up, sooner if needed.        Return in about 3 months (around 07/16/2020).    Progress notes reviewed and authenticated by Omelia Blackwater, PA-C 04/15/2020 10:38 AM

## 2020-04-15 NOTE — Patient Instructions (Addendum)
Obtain fexofenadine or Allegra to address seasonal allergies  For good fat, increase:  -Tree nuts (almonds, cashews, walnuts)  -Omega 3 or fish oil supplement

## 2020-04-22 ENCOUNTER — Ambulatory Visit: Admitting: Rehabilitative and Restorative Service Providers"

## 2020-04-22 DIAGNOSIS — G8929 Other chronic pain: Secondary | ICD-10-CM

## 2020-04-22 DIAGNOSIS — M25511 Pain in right shoulder: Secondary | ICD-10-CM

## 2020-04-22 NOTE — Allied Health Progress (Signed)
Physical Therapy Treatment Note: 04/22/2020   Covid -19 patient screen completed no fever, temperature96.16F, no SOB, no sore throat, no vomiting or diarrhea, no cough, no chills, no loss of taste or smell, no known exposure to others with symptoms. Patient is wearing a mask.    Treatment start time:932Treatment stop time:1000          Total # of visits:  4/12    Total Treatment Time: 28 minutes      Subjective: increased right shoulder pain for 1 1/2 hours after last treatment.    Objective: Patient seen for the following treatment:   Therapeutic exercises 10 minutes for stretches to right shoulder to increase flexion, ER/abduction and ER.  Manual therapy 18 minutes for soft tissue mobilization to right upper trapezius, levator scapulae, rhomboids, pectoralis major and deltoids, right median nerve sliders.    Assessment: feels a lot of tension right UE with median nerve sliders.  Remains tender over right posterior cervical paraspinals, upper trapezius and right shoulder.    Plan: continue physical therapy - as per plan of care.     Cala Bradford, PT

## 2020-04-27 DIAGNOSIS — J302 Other seasonal allergic rhinitis: Secondary | ICD-10-CM | POA: Insufficient documentation

## 2020-04-27 DIAGNOSIS — M7501 Adhesive capsulitis of right shoulder: Secondary | ICD-10-CM | POA: Insufficient documentation

## 2020-04-27 DIAGNOSIS — M7541 Impingement syndrome of right shoulder: Secondary | ICD-10-CM | POA: Insufficient documentation

## 2020-04-27 DIAGNOSIS — Z9889 Other specified postprocedural states: Secondary | ICD-10-CM | POA: Insufficient documentation

## 2020-05-06 ENCOUNTER — Ambulatory Visit: Attending: Internal Medicine | Admitting: Rehabilitative and Restorative Service Providers"

## 2020-05-06 DIAGNOSIS — G8929 Other chronic pain: Secondary | ICD-10-CM

## 2020-05-06 DIAGNOSIS — M25511 Pain in right shoulder: Secondary | ICD-10-CM | POA: Insufficient documentation

## 2020-05-06 NOTE — Allied Health Progress (Signed)
Physical Therapy Treatment Note: 05/06/2020   Covid -19 patient screen completed no fever, no SOB, no sore throat, no vomiting or diarrhea, no cough, no chills, no loss of taste or smell, no known exposure to others with symptoms. Patient is wearing a mask.    Treatment start time:935Treatment stop time:1002          Total # of visits: 5/12    Total Treatment Time: 27 minutes    Subjective: increased right shoulder pain on day of treatment then settles back down to baseline levels the next day.    Objective: Patient seen for the following treatment:   Therapeutic exercises for stretches to right shoulder to increase flexion, ER/abduction and ER.  Manual therapy for soft tissue mobilization to right upper trapezius, levator scapulae, rhomboids, pectoralis major and deltoids, right median nerve sliders.      Assessment: right shoulder ROM increasing ER in 90 degrees abduction 50 degrees.    Plan: continue physical therapy - as per plan of care.     Amanda Williamson, PT

## 2020-05-12 ENCOUNTER — Ambulatory Visit: Admitting: Rehabilitative and Restorative Service Providers"

## 2020-05-12 DIAGNOSIS — G8929 Other chronic pain: Secondary | ICD-10-CM

## 2020-05-12 NOTE — Allied Health Progress (Signed)
Physical Therapy Treatment Note: 05/12/2020   Covid -19 patient screen completed no fever, no SOB, no sore throat, no vomiting or diarrhea, no cough, no chills, no loss of taste or smell, no known exposure to others with symptoms. Patient is wearing a mask.    Treatment start time:1105Treatment stop time:1135              Total # of visits: 6/12    Total Treatment Time: 30 minutes        Subjective: more right shoulder pain and stiffness today    Objective: Patient seen for the following treatment:   Therapeutic exercises for stretches to right shoulder to increase flexion, ER/abduction and ER.  Manual therapy for soft tissue mobilization to right upper trapezius, levator scapulae, rhomboids, pectoralis majorand deltoids, right median nerve sliders.      Assessment: increased soreness post treatment with improved ROM right shoulder    Plan: continue physical therapy - as per plan of care    Cala Bradford, PT

## 2020-05-20 ENCOUNTER — Ambulatory Visit: Admitting: Rehabilitative and Restorative Service Providers"

## 2020-05-20 DIAGNOSIS — G8929 Other chronic pain: Secondary | ICD-10-CM

## 2020-05-20 NOTE — Allied Health Progress (Signed)
Physical Therapy Treatment Note: 05/20/2020   Covid -19 patient screen completed no fever, no SOB, no sore throat, no vomiting or diarrhea, no cough, no chills, no loss of taste or smell, no known exposure to others with symptoms. Patient is wearing a mask.    Treatment start time:933Treatment stop time:1001            Total # of visits: 7/12    Total Treatment Time: 28 minutes        Subjective: still cervical, right shoulder and right UE pain    Objective: Patient seen for the following treatment:   Therapeutic exercises for stretches to right shoulder to increase flexion, ER/abduction and ER.  Manual therapy for soft tissue mobilization to right upper trapezius, levator scapulae, rhomboids, pectoralis majorand deltoids, right median nerve sliders.  ROM right shoulder flexion 140 degrees                                     ER in 90 degrees abduction 60 degrees                                     IR in 90 degrees abduction 50 degrees      Assessment:right shoulder ROM improved, still pain.    Plan: continue physical therapy - as per plan of care.     Cala Bradford, PT

## 2020-05-27 ENCOUNTER — Ambulatory Visit: Admitting: Rehabilitative and Restorative Service Providers"

## 2020-05-27 DIAGNOSIS — M25511 Pain in right shoulder: Secondary | ICD-10-CM

## 2020-05-27 NOTE — Allied Health Progress (Signed)
Physical Therapy Treatment Note: 05/27/2020   Covid -19 patient screen completed no fever, no SOB, no sore throat, no vomiting or diarrhea, no cough, no chills, no loss of taste or smell, no known exposure to others with symptoms. Patient is wearing a mask.    Treatment start time:935Treatment stop time:1001          Total # of visits: 8/12    Total Treatment Time: 26 minutes        Subjective: states she feels less tight and less pain over her neck and right shoulder the day after her treatment.    Objective: Patient seen for the following treatment:   Therapeutic exercises for stretches to right shoulder to increase flexion, ER/abduction and ER.  Manual therapy for soft tissue mobilization to right upper trapezius, levator scapulae, rhomboids, pectoralis majorand deltoids, right median nerve sliders.      Assessment: patient is doing better at pacing ADL's and limiting them to 15-20 minutes at a time.    Plan: continue physical therapy - as per plan of care.    Cala Bradford, PT

## 2020-05-29 ENCOUNTER — Ambulatory Visit (HOSPITAL_BASED_OUTPATIENT_CLINIC_OR_DEPARTMENT_OTHER): Admitting: Internal Medicine

## 2020-05-29 VITALS — BP 115/77 | HR 87 | Temp 96.8°F | Resp 20 | Wt 184.8 lb

## 2020-05-29 DIAGNOSIS — J019 Acute sinusitis, unspecified: Secondary | ICD-10-CM

## 2020-05-29 DIAGNOSIS — J302 Other seasonal allergic rhinitis: Secondary | ICD-10-CM

## 2020-05-29 MED ORDER — AZELASTINE 137 MCG (0.1 %) NASAL SPRAY: 1.0000 | Freq: Two times a day (BID) | NASAL | 1 refills | Status: DC

## 2020-05-29 MED ORDER — AMOXICILLIN 875 MG-POTASSIUM CLAVULANATE 125 MG TABLET
1.0000 | ORAL_TABLET | Freq: Two times a day (BID) | ORAL | 0 refills | Status: AC
Start: 2020-05-29 — End: 2020-06-08

## 2020-05-29 NOTE — Progress Notes (Signed)
Family Practice Clinic Visit     Date: 05/29/2020  Patient Name: Amanda Williamson    CHIEF COMPLAINT:   Chief Complaint   Patient presents with    Allergies     cough , itchy throat and see hpi        Patient WAS wearing a cloth mask during visit  Contact precautions were followed when caring for the patient.   PPE used by provider during encounter: procedure mask      HISTORY OF PRESENT ILLNESS:    Pt continues to experience post nasal drainage and congestion despite continuing with OTC antihistamine and fluticasone nasal spray. She has marked congestion for the last 10 days, no fever, chills, body aches SOB or other symptoms of systemic infection. She states her throat has been noticeably itchy, which she is uncertain is due to her cough.       Review of Systems   Constitutional: Negative.    HENT: Positive for congestion, postnasal drip, sinus pressure and sore throat. Negative for rhinorrhea and sinus pain.    Respiratory: Positive for cough. Negative for apnea, wheezing and stridor.    Gastrointestinal: Negative.    Endocrine: Negative.    Genitourinary: Negative.    Musculoskeletal: Negative.    Neurological: Negative.        Note: This is a self-reported Review of Systems.    Vitals: BP 115/77 (SITE: left arm, Orthostatic Position: sitting, Cuff Size: regular)   Pulse 87   Temp 36 C (96.8 F) (Temporal)   Resp 20   Wt 83.8 kg (184 lb 12.8 oz)   SpO2 97%   BMI 31.72 kg/m   No LMP recorded. Patient is perimenopausal.    Physical Exam  Constitutional:       Appearance: Normal appearance.   HENT:      Ears:      Comments: Bilateral ears congested but not infectious     Nose:      Comments: Inferior nasal turbinates erythematous with hypertrophy, obstructed or L, 3+ on R.     Mouth/Throat:      Comments: Cobblestoning visualized in posterior oropharynx  Cardiovascular:      Rate and Rhythm: Normal rate and regular rhythm.      Pulses: Normal pulses.      Heart sounds: Normal heart sounds.   Pulmonary:       Effort: Pulmonary effort is normal.      Breath sounds: Normal breath sounds.   Abdominal:      General: Bowel sounds are normal.      Palpations: Abdomen is soft.   Skin:     General: Skin is warm and dry.   Neurological:      Mental Status: She is alert.   Psychiatric:         Mood and Affect: Mood normal.         Behavior: Behavior normal.          ASSESSMENT AND PLAN:    ICD-10-CM    1. Acute non-recurrent sinusitis, unspecified location  J01.90 Azelastine Nasal (ASTELIN) 137 mcg (0.1 %) Spray     Amoxicillin-Clavulanate (AUGMENTIN) 875-125 mg Tablet      -As pt continues to be symptomatic despite conservative tratment, suspect acute sinusitis  -START Augmentin BID x 10 days in addition to astelin nasal spray 1 spray each nostril twice daily  -CONTINUE with fluticasone nasal spray, recommend nasal saline rinses for additional symptomatic relief  -If no improvement, consider alternate ABx like  doxy, alarming symptoms and ED precautions reviewed  -RTO as previously instructed, sooner if needed.      No follow-ups on file.    Progress notes reviewed and authenticated by Omelia Blackwater, PA-C 05/29/2020 4:33 PM

## 2020-05-29 NOTE — Patient Instructions (Addendum)
1. Nasal saline rinse  2. Astelin: 1 spray each nostril twice daily  3. Fluticasone nasal spray 1 spray each nostril twice daily  4. Augmentin BID x 10 days

## 2020-06-03 ENCOUNTER — Ambulatory Visit: Admitting: Rehabilitative and Restorative Service Providers"

## 2020-06-03 DIAGNOSIS — M25511 Pain in right shoulder: Secondary | ICD-10-CM

## 2020-06-03 NOTE — Allied Health Progress (Signed)
Physical Therapy Treatment Note: 06/03/2020   Covid -19 patient screen completed no fever, no SOB, no sore throat, no vomiting or diarrhea, no cough, no chills, no loss of taste or smell, no known exposure to others with symptoms. Patient is wearing a mask.    Treatment start time:935Treatment stop time:1002          Total # of visits: 9/12    Total Treatment Time: 27 minutes        Subjective: has increased right shoulder and cervical soreness day after her treatments    Objective: Patient seen for the following treatment:   Therapeutic exercises for stretches to right shoulder to increase flexion, ER/abduction and ER.  Manual therapy for soft tissue mobilization to right upper trapezius, levator scapulae, rhomboids, pectoralis majorand deltoids, right median nerve sliders.  ROM right shoulder flexion 145 degrees                                     ER in 90 degrees abduction 70 degrees                                           Assessment: right shoulder ROM improved.    Plan: continue physical therapy - as per plan of care.     Cala Bradford, PT

## 2020-06-17 ENCOUNTER — Ambulatory Visit: Admitting: Rehabilitative and Restorative Service Providers"

## 2020-06-17 ENCOUNTER — Encounter (HOSPITAL_BASED_OUTPATIENT_CLINIC_OR_DEPARTMENT_OTHER): Payer: Self-pay | Admitting: Internal Medicine

## 2020-06-24 ENCOUNTER — Ambulatory Visit: Attending: Internal Medicine | Admitting: Rehabilitative and Restorative Service Providers"

## 2020-06-24 DIAGNOSIS — M25511 Pain in right shoulder: Secondary | ICD-10-CM | POA: Insufficient documentation

## 2020-06-24 DIAGNOSIS — G8929 Other chronic pain: Secondary | ICD-10-CM | POA: Insufficient documentation

## 2020-06-24 NOTE — Allied Health Progress (Signed)
Physical Therapy Treatment Note: 06/24/2020     Covid -19 patient screen completed no fever, no SOB, no sore throat, no vomiting or diarrhea, no cough, no chills, no loss of taste or smell, no known exposure to others with symptoms. Patient is wearing a mask.    Treatment start time:934Treatment stop time:1002        Total # of visits: 10/12    Total Treatment Time: 28 minutes        Subjective: feels stiff right shoulder today    Objective: Patient seen for the following treatment:   Therapeutic exercises for stretches to right shoulder to increase flexion, ER/abduction and ER.  Manual therapy for soft tissue mobilization to right upper trapezius, levator scapulae, rhomboids, pectoralis majorand deltoids, right median nerve sliders.      Assessment: very tender and tight right cervical paraspinals and upper trapezius.    Plan: continue physical therapy - as per plan of care.        Cala Bradford, PT

## 2020-06-24 NOTE — Allied Health Progress (Signed)
Physical Therapy Treatment Note: 06/24/2020     Covid -19 patient screen completed no fever, no SOB, no sore throat, no vomiting or diarrhea, no cough, no chills, no loss of taste or smell, no known exposure to others with symptoms. Patient is wearing a mask.    Treatment start time:934Treatment stop time:1002        Total # of visits: 10/12    Total Treatment Time: 28 minutes        Subjective: feels stiff right shoulder today    Objective: Patient seen for the following treatment:   Therapeutic exercises 10minutes for stretches to right shoulder to increase flexion, ER/abduction and ER.  Manual therapy 18minutes for soft tissue mobilization to right upper trapezius, levator scapulae, rhomboids, pectoralis majorand deltoids, right median nerve sliders.      Assessment: very tender and tight right cervical paraspinals and upper trapezius.    Plan: continue physical therapy - as per plan of care.        Kameo Bains, PT

## 2020-07-01 ENCOUNTER — Other Ambulatory Visit (HOSPITAL_BASED_OUTPATIENT_CLINIC_OR_DEPARTMENT_OTHER): Payer: Self-pay | Admitting: Internal Medicine

## 2020-07-01 ENCOUNTER — Ambulatory Visit: Admitting: Rehabilitative and Restorative Service Providers"

## 2020-07-01 DIAGNOSIS — E669 Obesity, unspecified: Secondary | ICD-10-CM

## 2020-07-01 DIAGNOSIS — G8929 Other chronic pain: Secondary | ICD-10-CM

## 2020-07-01 NOTE — Allied Health Progress (Signed)
Physical Therapy Treatment Note: 07/01/2020   Covid -19 patient screen completed no fever, no SOB, no sore throat, no vomiting or diarrhea, no cough, no chills, no loss of taste or smell, no known exposure to others with symptoms. Patient is wearing a mask.    Treatment start time:1038Treatment stop time:1106            Total # of visits: 11/12    Total Treatment Time: 28 minutes        Subjective: has increased right shoulder and cervical pain after hitting racquetball  in the house with grandson.    Objective: Patient seen for the following treatment:   Manual therapy for soft tissue mobilization to right upper trapezius, levator scapulae, rhomboids, pectoralis majorand deltoids.      Assessment: increased tightness over cervical and right shoulder musculature.    Plan: continue physical therapy - as per plan of care.     Cala Bradford, PT

## 2020-07-08 ENCOUNTER — Ambulatory Visit: Attending: Internal Medicine | Admitting: Rehabilitative and Restorative Service Providers"

## 2020-07-08 DIAGNOSIS — G8929 Other chronic pain: Secondary | ICD-10-CM

## 2020-07-08 DIAGNOSIS — M25511 Pain in right shoulder: Secondary | ICD-10-CM | POA: Insufficient documentation

## 2020-07-08 NOTE — Allied Health Progress (Signed)
Physical Therapy Treatment Note and Progress Report: 07/08/2020   Diagnosis:  Therapy diagnosis: M25.511,G89.29 (ICD-10-CM) - Chronic right shoulder pain  Medical diagnosis: M25.511,G89.29 (ICD-10-CM) - Chronic right shoulder pain    Authorizing MD (First and last name) and PI#: Patterson Hammersmith PA  Onset Date: 03/13/2020   Start of Care Date: 03/25/2020   Covid -19 patient screen completed no fever, no SOB, no sore throat, no vomiting or diarrhea, no cough, no chills, no loss of taste or smell, no known exposure to others with symptoms. Patient is wearing a mask.    Treatment start time:1034Treatment stop time:1104          Total # of visits: 12    Total Treatment Time: 30 minutes        Subjective: reports right shoulder pain 4/10 at best and 7/10 at worst ( was 5-8/10 on initial evaluation), feels some decrease in pain since starting therapy. Numbness right hand has decreased in frequency and duration.  Has been trying to pace activities so as not to cause flareups of her pain.    Objective: Patient seen for the following treatment:   Therapeutic exercises 21mnutes for stretches to right shoulder to increase flexion, ER/abduction and ER.  Manual therapy 12mutes for soft tissue mobilization to right upper trapezius, levator scapulae, rhomboids, pectoralis majorand deltoids.  AROM right shoulder ( measurements from inital evaluation in parentheses)                                       flexion 160 degrees ( 90 degrees)                                       abduction 100 degrees ( 90 degrees)                                      ER in 90 degrees abduction 75 degrees (10 degrees)                                       IR in 90 degrees abduction 70 degrees  ( 30 degrees)                                      Horizontal adduction right 25degrees ( 0 degrees)                                      Hand behind head right middle finger toT1 ( unable), left middle  finger to T1                                      Hand behind backright  thumb to T10 (unable) , left thumb to T7               Assessment: Treatment goals partially met. Exhibits improved AROM right shoulder and hs better ability to use her right  arm for ADL's in 15 minutes spurts.  Goals set on 03/25/2020  Patient will be able to sleep on right shoulder for 5-10 minutes before having to change sides. NOT MET, still painful to lay on right shoulder.  Patient will be able to flex right shoulder to 120 degrees to don/doff shirt. MET but with pain  Patient  will be able to externally rotate right shoulder to 45 degrees while elevated to 110 degrees to reach back of head to wash and brush hair. MET but with pain  Patient will be independent with home exercise program and home recommendations. MET  Able to use right arm to carry groceries into her home. PARTIALLY MET uses right arm to cary light grocery bags of items but not water or milk.    New Goals set on 07/08/2020  Able to tolerate 20 minutes of vacuuming and sweeping with right arm with 5/10 pain at worst.  Able to wash dishes and cook 20 minutes with right arm with 5/10 pain at worst.  Able to do dusting and wiping down household surfaces 20 minutes with 5/10 right arm pain at worst.    Plan: continue physical therapy 1x/week for 10 weeks for manual therapy for soft tissue mobilization, joint mobilization cervical spine and right glenohumeral joint, neurodynamic mobilization, therapeutic exercises for stretches, ROM exercises and strengthening exercises to neck and right shoulder.    Hulan Fray, PT

## 2020-07-10 ENCOUNTER — Ambulatory Visit: Attending: Family Medicine

## 2020-07-10 DIAGNOSIS — E1169 Type 2 diabetes mellitus with other specified complication: Secondary | ICD-10-CM | POA: Insufficient documentation

## 2020-07-10 DIAGNOSIS — E669 Obesity, unspecified: Secondary | ICD-10-CM | POA: Insufficient documentation

## 2020-07-10 LAB — COMPREHENSIVE METABOLIC PANEL
Adjusted Calcium: 9.5 mg/dL (ref 8.7–10.2)
Alanine Transferase (ALT): 29 U/L (ref 4–56)
Alb/Glob Ratio: 1.1 (ref 1.0–1.6)
Albumin: 4.1 g/dL (ref 3.2–4.7)
Alkaline Phosphatase (ALP): 59 U/L (ref 38–126)
Aspartate Transaminase (AST): 26 U/L (ref 9–44)
BUN/ Creatinine: 21.5 (ref 7.3–21.7)
Bilirubin Total: 0.5 mg/dL (ref 0.1–2.2)
Calcium: 9.6 mg/dL (ref 8.7–10.2)
Carbon Dioxide Total: 29 mmol/L (ref 22–32)
Chloride: 100 mmol/L (ref 99–109)
Creatinine Serum: 0.65 mg/dL (ref 0.50–1.30)
E-GFR, Non-African American: 108 mL/min/{1.73_m2} (ref >60)
E-GFR: 124 mL/min/{1.73_m2} (ref >60)
Globulin: 3.8 g/dL (ref 2.2–4.2)
Glucose: 115 mg/dL — ABNORMAL HIGH (ref 70–99)
Potassium: 4 mmol/L (ref 3.5–5.2)
Protein: 7.9 g/dL (ref 5.9–8.2)
Sodium: 139 mmol/L (ref 134–143)
Urea Nitrogen, Blood (BUN): 14 mg/dL (ref 6–21)

## 2020-07-10 LAB — HEMOGLOBIN A1C
Hgb A1C,Glucose Est Avg: 151 mg/dL
Hgb A1C: 6.9 % — ABNORMAL HIGH (ref 4.0–5.6)

## 2020-07-15 ENCOUNTER — Ambulatory Visit: Admitting: Rehabilitative and Restorative Service Providers"

## 2020-07-15 DIAGNOSIS — M25511 Pain in right shoulder: Secondary | ICD-10-CM

## 2020-07-15 DIAGNOSIS — G8929 Other chronic pain: Secondary | ICD-10-CM

## 2020-07-15 NOTE — Allied Health Progress (Signed)
Physical Therapy Treatment Note: 07/15/2020   Covid -19 patient screen completed no fever, no SOB, no sore throat, no vomiting or diarrhea, no cough, no chills, no loss of taste or smell, no known exposure to others with symptoms. Patient is wearing a mask.    Treatment start time:934Treatment stop time:1002          Total # of visits: 13/22    Total Treatment Time: 28 minutes        Subjective: has increased cervical and right shoulder pain with colder weather    Objective: Patient seen for the following treatment:   Therapeutic exercises for stretches to right shoulder to increase flexion, ER/abduction and ER.  Manual therapy for soft tissue mobilization to right upper trapezius, levator scapulae, rhomboids, pectoralis majorand deltoids.      Assessment: reasonable ROM right shoulder today but a lot of pain.    Plan: continue physical therapy - as per plan of care.     Cala Bradford, PT

## 2020-07-16 ENCOUNTER — Ambulatory Visit (HOSPITAL_BASED_OUTPATIENT_CLINIC_OR_DEPARTMENT_OTHER): Admitting: Internal Medicine

## 2020-07-16 ENCOUNTER — Encounter (HOSPITAL_BASED_OUTPATIENT_CLINIC_OR_DEPARTMENT_OTHER): Payer: Self-pay | Admitting: Internal Medicine

## 2020-07-16 VITALS — BP 129/85 | HR 78 | Temp 97.0°F | Ht 64.0 in | Wt 184.0 lb

## 2020-07-16 DIAGNOSIS — M2351 Chronic instability of knee, right knee: Secondary | ICD-10-CM

## 2020-07-16 DIAGNOSIS — M7501 Adhesive capsulitis of right shoulder: Secondary | ICD-10-CM

## 2020-07-16 DIAGNOSIS — I10 Essential (primary) hypertension: Secondary | ICD-10-CM

## 2020-07-16 DIAGNOSIS — E669 Obesity, unspecified: Secondary | ICD-10-CM

## 2020-07-16 DIAGNOSIS — E1169 Type 2 diabetes mellitus with other specified complication: Secondary | ICD-10-CM

## 2020-07-16 DIAGNOSIS — Z23 Encounter for immunization: Secondary | ICD-10-CM

## 2020-07-16 DIAGNOSIS — M7541 Impingement syndrome of right shoulder: Secondary | ICD-10-CM

## 2020-07-16 DIAGNOSIS — E785 Hyperlipidemia, unspecified: Secondary | ICD-10-CM

## 2020-07-16 NOTE — Progress Notes (Signed)
Flu Family Practice Clinic Visit     Date: 07/16/2020  Patient Name: Amanda Williamson    CHIEF COMPLAINT:   Chief Complaint   Patient presents with    Test Results       Patient WAS wearing a cloth mask during visit  Contact precautions were followed when caring for the patient.   PPE used by provider during encounter: procedure mask      HISTORY OF PRESENT ILLNESS:    Pt returns for DM follow up. Pt's hemoglobin A1C 6.9% with fasting glucose 115. She has continued with chronic medications with diet and lifestyle changes, which she continues with PT for chronic shoulder pain. She now notices pain with ambulation affecting R knee, denies any mechanical injuries or falls that could be contributory.       Review of Systems   Constitutional: Negative.    HENT: Negative.    Respiratory: Negative.    Cardiovascular: Negative.    Gastrointestinal: Negative.    Endocrine: Negative.    Musculoskeletal: Positive for arthralgias and neck pain. Negative for gait problem and neck stiffness.   Skin: Negative.    Neurological: Negative.    Psychiatric/Behavioral: Negative.        Note: This is a self-reported Review of Systems.    Vitals: BP 129/85 (SITE: left arm, Orthostatic Position: sitting, Cuff Size: regular)   Pulse 78   Temp 36.1 C (97 F)   Ht 1.626 m (5\' 4" )   Wt 83.5 kg (184 lb)   SpO2 99%   BMI 31.58 kg/m   No LMP recorded. Patient is perimenopausal.    Physical Exam  Constitutional:       Appearance: Normal appearance.   Cardiovascular:      Rate and Rhythm: Normal rate and regular rhythm.      Pulses: Normal pulses.      Heart sounds: Normal heart sounds.   Pulmonary:      Effort: Pulmonary effort is normal.      Breath sounds: Normal breath sounds.   Abdominal:      General: Bowel sounds are normal.      Palpations: Abdomen is soft.   Skin:     General: Skin is warm and dry.   Neurological:      Mental Status: She is alert.          ASSESSMENT AND PLAN:    ICD-10-CM    1. Diabetes mellitus type 2 in obese  (HCC)  E11.69 Comprehensive Metabolic Panel    E66.9 Hemoglobin A1C   2. Primary hypertension  I10    3. Dyslipidemia  E78.5 Lipid Panel   4. Chronic instability of right knee  M23.51 [facility] KNEE 4 OR MORE VIEWS RIGHT   5. Adhesive capsulitis of right shoulder  M75.01    6. Impingement syndrome of right shoulder  M75.41    7. Flu vaccine need  Z23 INFLUENZA VACCINE (54 MONTH OLD AND OLDER), QUADRIVALENT, NO LATEX, NO PRESERVATIVE        -DM Stable with Metformin 1,000 mg BID; continue with diet and lifestyle changes  Lipid panel, CMP and hemoglobin A1C prior to next visit   -Obtain baseline x-ray of R knee prior to initiating PT for chronic R knee pain  -Consider referral to Ortho if no improvement as she has received steroid injection in her R knee; mother has Hx of bilateral knee replacement  -Flu vaccination administered by staff  -RTO in 3 months for DM follow up, sooner if  needed.    Return in about 3 months (around 10/16/2020).    Progress notes reviewed and authenticated by Omelia Blackwater, PA-C 07/16/2020 10:15 AM

## 2020-07-16 NOTE — Progress Notes (Signed)
Standing Order- Influenza Vaccination   6 months thru 64 years  [id number]  1.0 Purpose of Standing Order  To screen patients for eligibility and administration of influenza immunization by a Medical Assistant (MA) by way of standing order for patients with appropriate response to screening questions.       2.0 Target Population  Patients who present in the clinic requesting Influenza Vaccination and are 6mos-64 yrs of age.    3.0 Responsible Parties  Physicians are accountable for all vaccinations conveyed via written standing order.  Standing orders are to be reviewed annually.    4.0 Procedure  1. MA receives a request from a patient or physician for influenza vaccination.     5.0  Screening Checklist for Contraindications to Inactivated Injectable Influenza Vaccination          1. Is the person age  younger than 6 months or 65 years or older?  No   2. Has the person already received a flu shot for the 2021-2022 season:  No     3. Is the person to be vaccinated running a fever greater than 101 degrees? No     4. Does the person to be vaccinated have an allergy to the vaccine No     5. Has the person to be vaccinated ever had a reaction   to influenza vaccine in the past? No     6. Has the person to be vaccinated ever had an illness that caused paralysis? No     If the person answers NO to every question administer the following:  If the person answers YES or UNSURE to any question consult with the MD, NP or PA prior to administration.      Preference list order name  Description   Flu Vacc 6mos +  Single Dose 0.5ml prefilled syringe, IM, Once, Now

## 2020-07-18 ENCOUNTER — Ambulatory Visit
Admission: RE | Admit: 2020-07-18 | Discharge: 2020-07-18 | Disposition: A | Discharge status: Home / Self Care | Source: Ambulatory Visit | Attending: Internal Medicine | Admitting: Internal Medicine

## 2020-07-18 DIAGNOSIS — M2351 Chronic instability of knee, right knee: Secondary | ICD-10-CM | POA: Insufficient documentation

## 2020-07-21 ENCOUNTER — Telehealth (HOSPITAL_BASED_OUTPATIENT_CLINIC_OR_DEPARTMENT_OTHER): Payer: Self-pay | Admitting: Internal Medicine

## 2020-07-21 NOTE — Telephone Encounter (Signed)
Pt had flu shot on weds last week 07/16/20 in the office and then on Friday started to coughing/wheezing and the chest is hurt, and also has yellow/greenish mucus not a lot, usually when coughing, Pt was hot yesterday, no fever but was perspiring  has been taking cough drops, nyquil etc, she is prone bronchitis, and is concerned that this may develop into this. Please can she be called ASAP [phone],

## 2020-07-22 ENCOUNTER — Ambulatory Visit (HOSPITAL_BASED_OUTPATIENT_CLINIC_OR_DEPARTMENT_OTHER): Admitting: NURSE PRACTITIONER

## 2020-07-22 ENCOUNTER — Ambulatory Visit

## 2020-07-22 ENCOUNTER — Encounter (HOSPITAL_BASED_OUTPATIENT_CLINIC_OR_DEPARTMENT_OTHER): Payer: Self-pay | Admitting: NURSE PRACTITIONER

## 2020-07-22 ENCOUNTER — Ambulatory Visit: Admitting: NURSE PRACTITIONER

## 2020-07-22 ENCOUNTER — Ambulatory Visit
Admission: RE | Admit: 2020-07-22 | Discharge: 2020-07-22 | Disposition: A | Discharge status: Home / Self Care | Source: Ambulatory Visit | Attending: NURSE PRACTITIONER | Admitting: NURSE PRACTITIONER

## 2020-07-22 ENCOUNTER — Ambulatory Visit: Admitting: Rehabilitative and Restorative Service Providers"

## 2020-07-22 VITALS — BP 131/86 | HR 90 | Temp 97.0°F | Resp 18 | Ht 64.0 in | Wt 180.0 lb

## 2020-07-22 DIAGNOSIS — R059 Cough, unspecified: Secondary | ICD-10-CM

## 2020-07-22 DIAGNOSIS — R0982 Postnasal drip: Secondary | ICD-10-CM

## 2020-07-22 DIAGNOSIS — R0989 Other specified symptoms and signs involving the circulatory and respiratory systems: Secondary | ICD-10-CM

## 2020-07-22 DIAGNOSIS — R61 Generalized hyperhidrosis: Secondary | ICD-10-CM | POA: Insufficient documentation

## 2020-07-22 LAB — POC RAPID COVID-19 ANTIGEN (MMC): POC Rapid Covid-19 Antigen (MMC): NOT DETECTED

## 2020-07-22 MED ORDER — DOXYCYCLINE HYCLATE 100 MG CAPSULE
100.0000 mg | ORAL_CAPSULE | Freq: Two times a day (BID) | ORAL | 0 refills | Status: DC
Start: 2020-07-22 — End: 2020-07-23

## 2020-07-22 MED ORDER — BENZONATATE 100 MG CAPSULE: 100.0000 mg | ORAL_CAPSULE | Freq: Three times a day (TID) | ORAL | 0 refills | Status: DC | PRN

## 2020-07-22 NOTE — Progress Notes (Signed)
[id number]  07/22/2020     The patient presents today with symptoms concerning for COVID-19     History  Onset of symptoms:  5  day.  Symptoms:  Patient Reported Covid Symptoms: New or worsening cough and chest congestion, headache that resolved. Sweats on forehead.  Course of illness:  worsening  Severity of Symptoms: moderate  Other Pertinent Hx:  Flu shot 4 days ago, was getting better and now getting worse.     Exposure:  Any known exposure in the past 14 days to someone with confirmed coronavirus or who has been quarantined? Not sure  Resident in a congregated care setting? No  Employed in a healthcare setting? no     Testing:  Previously recently (past 2 weeks)  tested for COVID-19? YES     Immunized:  YES     COVID Risk Screening:  Covid Distress Symptoms: None  PHx/High Risk Co morbidities: DM, Hypertension and Obesity     Problem List and/or PMHx Reviewed     Vitals:  Temp - 36.1 C (97 F) (Temporal)    BP - 131/86, HR - 90, RR - 18,  Pulse Ox - 97% Last BMI: 30.90 kg/m   General:  NL: alert, NAD, well hydrated appearing   Head:  Normal, Normocephalic  Ears:   Not Examined  Eyes:  Normal, Conjunctiva NL, No Scleral Icterus  Nose:  No Rhinorrhea  Oropharynx:  post nasal drip  Neck:  Supple  CV:  RRR  Pulm:  Rales and LLL  Abd:  Not examined  Musc:  Normal Movement Upper Ext  Skin:  No Obvious Rash, Warm  Neuro:  Oriented, no obvious deficients  Psych: Normal Thought Content, behavior normal, judgement normal, mood normal     Assessment and orders:  There are no diagnoses linked to this encounter.     Rapid COVID Antigen result:   Negative  If Negative, PCR test sent     Plan:     Severity of Illness: Mild/Moderate Illness  Number of COVID risk factors:  3   Pulse Ox - 97%   Risk Plan: Moderate Risk for complication of illness     Discussion: standardized education given, including isolation/quarantine, rest, hydration, warning signs and complete chest xray and start doxycycline today. F/u as discussed.      Follow-up:as needed, if symptoms worsen or fail to improve

## 2020-07-22 NOTE — Progress Notes (Signed)
Results released to patient online.

## 2020-07-22 NOTE — Progress Notes (Signed)
Discussed during office visit today.

## 2020-07-23 ENCOUNTER — Other Ambulatory Visit (HOSPITAL_BASED_OUTPATIENT_CLINIC_OR_DEPARTMENT_OTHER): Payer: Self-pay | Admitting: Family Medicine

## 2020-07-23 ENCOUNTER — Telehealth (HOSPITAL_BASED_OUTPATIENT_CLINIC_OR_DEPARTMENT_OTHER): Payer: Self-pay | Admitting: Internal Medicine

## 2020-07-23 ENCOUNTER — Other Ambulatory Visit (HOSPITAL_BASED_OUTPATIENT_CLINIC_OR_DEPARTMENT_OTHER): Payer: Self-pay | Admitting: Internal Medicine

## 2020-07-23 DIAGNOSIS — J019 Acute sinusitis, unspecified: Secondary | ICD-10-CM

## 2020-07-23 DIAGNOSIS — M2351 Chronic instability of knee, right knee: Secondary | ICD-10-CM

## 2020-07-23 LAB — MMC NAAT FOR SARS-COV-2: Coronavirus by PCR MMC: NEGATIVE

## 2020-07-23 MED ORDER — DOXYCYCLINE HYCLATE 100 MG CAPSULE
100.0000 mg | ORAL_CAPSULE | Freq: Two times a day (BID) | ORAL | 0 refills | Status: AC
Start: 2020-07-23 — End: 2020-08-02

## 2020-07-23 NOTE — Telephone Encounter (Signed)
Pt called and stated that she was seen by JW on 07/22/20 and was prescribed Doxycyline, and her insurance does not cover this she needs to have an alternative faxed over to [facility] [employer] in [place] please can she be called [phone] as she needs the RX    She is worried that she may get pneumonia

## 2020-07-23 NOTE — Telephone Encounter (Signed)
Patient was informed.

## 2020-07-23 NOTE — Telephone Encounter (Signed)
Refill Guidance: Typical Refill: 90 days / 3 refills; Schedule appt before sending request if last visit over 1 year ago  Please verify correct pharmacy  Please ensure 90 day supply for mail order pharmacies.  Please verify prescription days equals quantity x refills  Recent Visits  Date Type Provider Dept   07/22/20 Office Visit Shon Baton, NP [facility]   07/21/20 Telephone Lewkowitz, Elmarie Mainland, PA [facility]   07/16/20 Office Visit Audrea Muscat, [place] [facility]   05/29/20 Office Visit Audrea Muscat, [place] [facility]   04/15/20 Office Visit Audrea Muscat, [place] [facility]   03/13/20 Office Visit Lewkowitz, Elmarie Mainland, PA [facility]   Showing recent visits within past 365 days with a meds authorizing provider and meeting all other requirements  Future Appointments  Date Type Provider Dept   10/16/20 Appointment Davonna Belling, Elmarie Mainland, PA [facility]   Showing future appointments within next 180 days with a meds authorizing provider and meeting all other requirements

## 2020-07-23 NOTE — Telephone Encounter (Signed)
sending to covering provider,please advise?

## 2020-07-23 NOTE — Telephone Encounter (Signed)
Sent. -ARL

## 2020-07-23 NOTE — Telephone Encounter (Signed)
Pt had appt 07/22/2020

## 2020-07-23 NOTE — Telephone Encounter (Signed)
Patient was informed she would like to see Jonny Ruiz for physical therapy for her knee please send the referral

## 2020-07-23 NOTE — Addendum Note (Signed)
Addended by: Gordy Levan on: 07/23/2020 07:52 PM     Modules accepted: Orders

## 2020-07-24 ENCOUNTER — Encounter (HOSPITAL_BASED_OUTPATIENT_CLINIC_OR_DEPARTMENT_OTHER): Payer: Self-pay | Admitting: Internal Medicine

## 2020-07-24 NOTE — Progress Notes (Signed)
Results released to patient online.

## 2020-07-24 NOTE — Telephone Encounter (Signed)
Patient was informed.

## 2020-08-15 ENCOUNTER — Other Ambulatory Visit (HOSPITAL_BASED_OUTPATIENT_CLINIC_OR_DEPARTMENT_OTHER): Payer: Self-pay | Admitting: Internal Medicine

## 2020-08-15 DIAGNOSIS — J019 Acute sinusitis, unspecified: Secondary | ICD-10-CM

## 2020-08-15 NOTE — Telephone Encounter (Signed)
Last refill: 07/23/2020    Please verify correct pharmacy  Please ensure 90 day supply for mail order pharmacies.  Please verify prescription days equals quantity x refills  BP Readings from Last 1 Encounters:   07/22/20 131/86      Pulse Readings from Last 1 Encounters:   07/22/20 90     Lab Results   Component Value Date    WBC 5.7 03/14/2020    HGB 13.0 03/14/2020    PLT 237 03/14/2020    NA 139 07/10/2020    K 4.0 07/10/2020    GLU 115 (H) 07/10/2020    CR 0.65 07/10/2020    ALT 29 07/10/2020     Recent Visits  Date Type Provider Dept   07/23/20 Telephone Davonna Belling, Elmarie Mainland, PA [facility]   07/23/20 Telephone Davonna Belling, Elmarie Mainland, PA [facility]   07/22/20 Office Visit Jose Persia, Kirt Boys, NP [facility]   07/21/20 Telephone Lewkowitz, Elmarie Mainland, PA [facility]   07/16/20 Office Visit Lewkowitz, Elmarie Mainland, [place] [facility]   05/29/20 Office Visit Lewkowitz, Elmarie Mainland, [place] [facility]   04/15/20 Office Visit Audrea Muscat, [place] [facility]   03/13/20 Office Visit Lewkowitz, Elmarie Mainland, PA [facility]   Showing recent visits within past 365 days with a meds authorizing provider and meeting all other requirements  Future Appointments  Date Type Provider Dept   10/16/20 Appointment Davonna Belling, Elmarie Mainland, PA [facility]   Showing future appointments within next 180 days with a meds authorizing provider and meeting all other requirements        CURES Audit Trail          User Date Status                 Current Medications  has a current medication list which includes the following prescription(s): azelastine nasal, benzonatate, psyllium husk, fluticasone, iron, l.acid/l.casei/b.bif/b.lon/fos, lisinopril, magnesium, metformin, multivit,calc,mins/iron/folic, multivitamin, mv-min/vit c/glut/lysine/hc124, naproxen sodium, simvastatin, and turmeric.

## 2020-08-19 ENCOUNTER — Ambulatory Visit: Attending: Internal Medicine | Admitting: Rehabilitative and Restorative Service Providers"

## 2020-08-19 DIAGNOSIS — G8929 Other chronic pain: Secondary | ICD-10-CM | POA: Insufficient documentation

## 2020-08-19 DIAGNOSIS — M25511 Pain in right shoulder: Secondary | ICD-10-CM | POA: Insufficient documentation

## 2020-08-19 NOTE — Allied Health Progress (Signed)
Physical Therapy Treatment Note: 08/19/2020   Covid -19 patient screen completed no fever, no SOB, no sore throat, no vomiting or diarrhea, no cough, no chills, no loss of taste or smell, no known exposure to others with symptoms. Patient is wearing a mask.    Treatment start time:935Treatment stop time:1003            Total # of visits: 14/22    Total Treatment Time: 28 minutes        Subjective: c/o cervical and right shoulder pain, has a lot of emotional stress after nephew passed away recently.    Objective: Patient seen for the following treatment:   Therapeutic exercises for stretches to right shoulder to increase flexion, ER/abduction and ER.  Manual therapy for soft tissue mobilization to right upper trapezius, levator scapulae, rhomboids, pectoralis majorand deltoids.    Assessment: tight and tender right upper trapezius, levator scapula, tender over right supraspinatus.    Plan: continue physical therapy - as per plan of care.     Amanda Williamson, PT

## 2020-08-26 ENCOUNTER — Ambulatory Visit: Admitting: Rehabilitative and Restorative Service Providers"

## 2020-08-26 DIAGNOSIS — G8929 Other chronic pain: Secondary | ICD-10-CM

## 2020-08-26 NOTE — Allied Health Progress (Signed)
Physical Therapy Treatment Note: 08/26/2020   Covid -19 patient screen completed no fever, no SOB, no sore throat, no vomiting or diarrhea, no cough, no chills, no loss of taste or smell, no known exposure to others with symptoms. Patient is wearing a mask.    Treatment start time:932Treatment stop time:1000            Total # of visits: 15/22    Total Treatment Time: 28 minutes        Subjective: still painful to use right shoulder for ADL's    Objective: Patient seen for the following treatment:   Therapeutic exercises for stretches to right shoulder to increase flexion and ER, right shoulder ER in left sidelying 2 x10 reps.  Manual therapy for soft tissue mobilization to right upper trapezius, levator scapulae, rhomboids, pectoralis majorand deltoids, right shoulder rhythmic stabilization.  AROM right shoulder ER in 90 degrees abduction 90 degrees with pain                                            Flexion 160 degrees with pain.    Assessment: right shoulder ROM improved but still significant pain with activity.    Plan: continue physical therapy - as per plan of care.     Cala Bradford, PT

## 2020-09-04 ENCOUNTER — Ambulatory Visit: Attending: Internal Medicine | Admitting: Rehabilitative and Restorative Service Providers"

## 2020-09-04 DIAGNOSIS — M25511 Pain in right shoulder: Secondary | ICD-10-CM | POA: Insufficient documentation

## 2020-09-04 DIAGNOSIS — G8929 Other chronic pain: Secondary | ICD-10-CM | POA: Insufficient documentation

## 2020-09-04 NOTE — Allied Health Progress (Signed)
Physical Therapy Treatment Note: 09/04/2020   Covid -19 patient screen completed no fever, no SOB, no sore throat, no vomiting or diarrhea, no cough, no chills, no loss of taste or smell, no known exposure to others with symptoms. Patient is wearing a mask.    Treatment start time:1038Treatment stop time:1106            Total # of visits: 16/22    Total Treatment Time: 28 minutes        Subjective: pain right shoulder somewhat better today.    Objective: Patient seen for the following treatment:   Therapeutic exercises for stretches to right shoulder to increase flexion and ER, right shoulder ER in left sidelying 2 x10 reps using 1 pound, lean over raised treatment table right shoulder horizontal abduction 2x10 reps , rowing 2x10 reps using 2 pounds, abduction in right sidelying 2x10 reps. Videoed patient's new home exercises on her cell phone.  Manual therapy for soft tissue mobilization to right upper trapezius, levator scapulae, rhomboids, pectoralis majorand deltoids, right shoulder rhythmic stabilization.      Assessment: needs to continue to work on strengthening exercises right shoulder.    Plan: continue physical therapy - as per plan of care.     Cala Bradford, PT

## 2020-09-09 ENCOUNTER — Ambulatory Visit: Admitting: Rehabilitative and Restorative Service Providers"

## 2020-09-09 DIAGNOSIS — G8929 Other chronic pain: Secondary | ICD-10-CM

## 2020-09-09 DIAGNOSIS — M25511 Pain in right shoulder: Secondary | ICD-10-CM

## 2020-09-09 NOTE — Allied Health Progress (Signed)
Physical Therapy Treatment Note: 09/09/2020   Covid -19 patient screen completed no fever, no SOB, no sore throat, no vomiting or diarrhea, no cough, no chills, no loss of taste or smell, no known exposure to others with symptoms. Patient is wearing a mask.    Treatment start time:1140Treatment stop time:1210            Total # of visits: 17/22    Total Treatment Time: 30 minutes        Subjective: some improvement right shoulder, less numbness in right hand, wakes up 2-3 times a night instead 4-5 times a night     Objective: Patient seen for the following treatment:   Therapeutic exercises for stretches to right shoulder to increase flexion, abductionandER, right shoulder ER in left sidelying 2 x10 reps using 1 pound, lean over raised treatment table right shoulder horizontal abduction 2x10 reps , rowing 2x10 reps using 2 pounds, abduction in right sidelying 2x10 reps.   Manual therapy for soft tissue mobilization to right upper trapezius, levator scapulae, rhomboids, pectoralis majorand deltoids, right shoulder rhythmic stabilization.        Assessment: still right shoulder pain but able to use it for longer periods to do ADL's.    Plan: continue physical therapy - as per plan of care.     Cala Bradford, PT

## 2020-09-15 ENCOUNTER — Emergency Department
Admission: RE | Admit: 2020-09-15 | Discharge: 2020-09-15 | Disposition: A | Discharge status: Home / Self Care | Attending: Emergency Medicine | Admitting: Emergency Medicine

## 2020-09-15 ENCOUNTER — Emergency Department

## 2020-09-15 DIAGNOSIS — M1711 Unilateral primary osteoarthritis, right knee: Secondary | ICD-10-CM | POA: Insufficient documentation

## 2020-09-15 MED ORDER — KETOROLAC 30 MG/ML (1 ML) INJECTION SOLUTION
30.0000 mg | Freq: Once | INTRAMUSCULAR | Status: AC
Start: 2020-09-15 — End: 2020-09-15
  Administered 2020-09-15: 07:00:00 30 mg via INTRAMUSCULAR
  Filled 2020-09-15: qty 1

## 2020-09-15 NOTE — Discharge Instructions (Addendum)
Please follow-up with your primary care doctor for reeval and likely orthopedic referral.

## 2020-09-15 NOTE — ED Nursing Note (Signed)
Report given to Andrew, RN.

## 2020-09-15 NOTE — ED Nursing Note (Addendum)
No warmth or redness noted to right knee. Patient reports feeling a burning pain in the knee that travels up to the hip. Last took arthritis tylenol last night for the pain which helped a little. States that her knee pain has been worse since she banged it on Oct 30.

## 2020-09-15 NOTE — ED Nursing Note (Signed)
Patient able to ambulate with some assistance from her daughter to the bathroom at the end of the hall.

## 2020-09-15 NOTE — ED Provider Notes (Signed)
DOS: 09/15/2020         Primary Care Provider: Audrea Muscat, PA    Chief Complaint   Patient presents with    Knee Pain     The history provided by the patient, medical records and daughter.    This is a 54yr female, with history of diabetes, erythema nodosum, kidney stones, patellofemoral pain syndrome, who presents to the ED with a chief complaint of knee pain.  Patient reports chronic knee pain, remote history of cortisone injections 8 or 9 years ago which did improve some of her symptoms.  She states over last one half she has had worsening pain which has been walking a lot.  She reports October 30 there is some issue when she was in a car to try to jump to the front seat when her knee got caught somehow and since then has had worsening pain.  No imaging since this injury.  She does report she is taking Tylenol Motrin Voltaren gel with some minimal improvement.      Past Medical History:   Diagnosis Date    Diabetes (HCC)     Erythema nodosum     H/O mammogram 09/10/2016    Kidney stones     PFS (patellofemoral syndrome)     Knee pain    RAD (reactive airway disease)        Past Surgical History:   Procedure Laterality Date    COLONOSCOPY  07/08/2012    WNL    COLONOSCOPY  02/24/2015    ESOPHAGOGASTRODUODENOSCOPY (EGD)  02/24/2015    LIGATION, FALLOPIAN TUBE, BILATERAL, USING TUBAL RING  1994       Social History     Tobacco Use    Smoking status: Never Smoker    Smokeless tobacco: Never Used   Substance Use Topics    Alcohol use: No       Physical Exam  Vitals and nursing note reviewed.   Constitutional:       Appearance: Normal appearance.   HENT:      Head: Normocephalic and atraumatic.   Eyes:      Extraocular Movements: Extraocular movements intact.      Conjunctiva/sclera: Conjunctivae normal.      Pupils: Pupils are equal, round, and reactive to light.   Cardiovascular:      Rate and Rhythm: Normal rate.      Pulses: Normal pulses.   Pulmonary:      Effort: Pulmonary  effort is normal. No respiratory distress.   Musculoskeletal:         General: Swelling and tenderness present. No deformity. Normal range of motion.      Cervical back: Normal range of motion.      Comments: Minor swelling/tenderness of the L knee. No obvious deformities.   Skin:     General: Skin is warm.   Neurological:      General: No focal deficit present.      Mental Status: She is alert and oriented to person, place, and time.      Gait: Gait normal.   Psychiatric:         Mood and Affect: Mood normal.         ROS: Positives and pertinent negatives as per HPI. All other systems were reviewed and are negative.      ED Triage Vitals [09/15/20 0550]   Enc Vitals Group      BP (!) 137/92      Pulse 75  Resp 15      Temp (!) 35.9 C (96.7 F)      Temp src       SpO2 99 %      Weight 82 kg (180 lb 12.4 oz)      Height 1.626 m (5\' 4" )      Head Circumference       Peak Flow       Pain Score       Pain Loc       Pain Edu?       Excl. in GC?        INITIAL ASSESSMENT & PLAN    Patient is a 54yr female with a history significant for patellofemoral pain syndrome, diabetes who present to the ED with knee pain.     Initial vital signs essentially unremarkable    Differentials on this patient include but are not limited too: Probably osteoarthritis, some sort of inflammatory colitis, low suspicion for any underlying fracture     MDM: Otherwise well-appearing 54 year old female presenting with knee pain.  She reports chronic knee pain and then had some sort of injury on October 30 when she reports it got stuck when she was trying to jump to the front seat of a car.  Her vitals otherwise unremarkable here.  Mild swelling tenderness of the right knee with no obvious deformity.  CSM grossly intact, no calf swelling or tenderness.      Results of Testing    Lab work Obtained:   No results found for this visit on 09/15/20.    Medications given:   Medications   Ketorolac (TORADOL) Injection 30 mg (30 mg IM Given 09/15/20  0709)       Radiology Results:    [facility] KNEE 4 OR MORE VIEWS RIGHT    Result Date: 09/15/2020  [facility] KNEE 4 OR MORE VIEWS RIGHT TECHNIQUE: AP, AP oblique, lateral, and tunnel views of right knee. INDICATION: Female, 54 years old, presents with chronic right knee pain and swelling. COMPARISON: 07/18/2020. FINDINGS: There is no evidence for acute right knee fracture, and no malalignment. Small to moderate volume knee joint effusion is present. There is moderate tricompartmental osteoarthritis again seen at the knee. Soft tissue planes at the knee are grossly preserved.     IMPRESSION: 1. Negative for acute right knee fracture/malalignment. 2. Moderate tricompartmental osteoarthritis at the knee similar to 07/18/2020. 3. Small to moderate volume knee joint effusion.          LATEST VITAL SIGNS  Temp: 35.9 C (96.7 F) (12/13 0550)  Temp src: --  Pulse: 75 (12/13 0550)  BP: 137/92 (12/13 0550)  Resp: 15 (12/13 0550)  SpO2: 99 % (12/13 0550)  Height: 162.6 cm (5\' 4" ) (12/13 0550)  Weight: 82 kg (180 lb 12.4 oz) (12/13 0550)       Based on results and overall disposition I feel patient is stable for outpatient follow-up and reevaluation.  I discussed any results including lab work and imaging with patient and answered all questions.  They are advised to follow-up with PCP  and return to ED for any worsening or concerning symptoms. Discussed return precautions.      Clinical Impression:     ICD-10-CM    1. Osteoarthritis of right knee, unspecified osteoarthritis type  M17.11          Medications: none    Disposition: Discharge    Condition: Stable    TIME: 07:48  Electronically signed by Chapman Fitch, DO

## 2020-09-15 NOTE — ED Nursing Note (Signed)
Discharge Note      Written and verbal education provided; the patient verbalized understanding.  All questions and concerns addressed. Patient discharged to Home. All belongings sent with patient at time of discharge.  Patient leaves the ED wheelchair with Family.    Patient's pain/comfort level assessed during their ED course and interventions for increased comfort were implemented as needed.    Non-pharmacological interventions: repositioning.    Interventions were effective.    Patient was assessed for side effects after receiving pain medications. Side Effects: none

## 2020-09-15 NOTE — ED Triage Note (Signed)
Pt has R knee pain c swelling. Neg Trauma. Pt has chronic knee pain.

## 2020-09-16 ENCOUNTER — Ambulatory Visit: Admitting: Rehabilitative and Restorative Service Providers"

## 2020-09-16 DIAGNOSIS — G8929 Other chronic pain: Secondary | ICD-10-CM

## 2020-09-16 NOTE — Allied Health Progress (Signed)
Physical Therapy Treatment Note: 09/16/2020   Covid -19 patient screen completed no fever, no SOB, no sore throat, no vomiting or diarrhea, no cough, no chills, no loss of taste or smell, no known exposure to others with symptoms. Patient is wearing a mask.    Treatment start time:1038Treatment stop time:1108          Total # of visits: 18/22    Total Treatment Time: 30 minutes        Subjective: had severe knee pain and had to go to [facility] ER and given Toradol injection which helped her pain.    Objective: Patient seen for the following treatment:   Therapeutic exercises for stretches to right shoulder to increase flexion, abductionandER, right shoulder ER in left sidelying 2 x10 repsusing 1 pound, lean over raised treatment table right shoulder horizontal abduction 2x10 reps , rowing 2x10 reps using 2 pounds, abduction in right sidelying2x10 reps.  Manual therapy100minutes for soft tissue mobilization to right upper trapezius, levator scapulae, rhomboids, pectoralis majorand deltoids, right shoulder rhythmic stabilization.      Assessment: some pain right shoulder when stretched to end of available ROM shoulder flexion, abduction and ER.    Plan: continue physical therapy - as per plan of care.     Cala Bradford, PT

## 2020-09-19 ENCOUNTER — Encounter (HOSPITAL_BASED_OUTPATIENT_CLINIC_OR_DEPARTMENT_OTHER): Payer: Self-pay | Admitting: NURSE PRACTITIONER

## 2020-09-19 ENCOUNTER — Ambulatory Visit: Attending: Internal Medicine

## 2020-09-19 ENCOUNTER — Ambulatory Visit (HOSPITAL_BASED_OUTPATIENT_CLINIC_OR_DEPARTMENT_OTHER): Admitting: NURSE PRACTITIONER

## 2020-09-19 VITALS — BP 126/70 | HR 92 | Temp 96.4°F | Resp 12 | Ht 64.0 in | Wt 180.8 lb

## 2020-09-19 DIAGNOSIS — E669 Obesity, unspecified: Secondary | ICD-10-CM

## 2020-09-19 DIAGNOSIS — M1711 Unilateral primary osteoarthritis, right knee: Secondary | ICD-10-CM

## 2020-09-19 DIAGNOSIS — E1169 Type 2 diabetes mellitus with other specified complication: Secondary | ICD-10-CM

## 2020-09-19 DIAGNOSIS — M2351 Chronic instability of knee, right knee: Secondary | ICD-10-CM

## 2020-09-19 LAB — MICROALBUMIN
Creatinine Spot Urine: 57.8 mg/dL
Microalbumin Urine: 49 mg/L — ABNORMAL HIGH (ref <=30.0)
Microalbumin/Creatinine Ratio: 85 mg/g — ABNORMAL HIGH (ref <=30)

## 2020-09-19 NOTE — Progress Notes (Signed)
Assessment & Plan:   (M23.51) Chronic instability of right knee  (primary encounter diagnosis)  Comment: Based on concerns today have recommended she follow-up with orthopedist  She agreed with this plan  She has type 2 diabetes and is due for an ophthalmology follow-up and also microalbumin both of which have been ordered today  Precautions of care were discussed with her today and she agreed with plan of care  Recommend follow-up with PCP with any concerns  Plan: ORTHOPEDIC-GENERAL REFERRAL    (M17.11) Osteoarthritis of right knee, unspecified osteoarthritis type  Plan: ORTHOPEDIC-GENERAL REFERRAL  (E11.69,  E66.9) Diabetes mellitus type 2 in obese [facility])  Plan: OPHTHALMOLOGY CLINIC REFERRAL, Microalbumin    Amanda Williamson is a 89yr female.   Chief Complaint   Patient presents with    Hospital Discharge    Knee Pain     1 month worsening   Has been seen ER told osteoarthritis  Told bone-on-bone  Pain with swelling  Likes to walk a lot get worse with walking  Denies fever or chills or heat from the knee  Pain is now increasing to right hip from knee  Would like to see orthopedist and have evaluation  Feels like knee is giving out with a lot of use  Has been wearing a knee sleeve and only helps a little  Has been going on for a while now getting worse  HPI    Review of Systems   Constitutional: Negative for fever.   Musculoskeletal: Positive for gait problem (Right knee) and joint swelling (Right knee). Negative for myalgias.   Skin: Negative for color change and rash.   Neurological: Negative for weakness.     Patient Active Problem List   Diagnosis    RAD (reactive airway disease)    Erythema nodosum    PFS (patellofemoral syndrome)    Diabetes mellitus type 2 in obese (HCC)    HTN (hypertension)    Dyslipidemia    Colon polyps    Breast cancer screening    Adhesive capsulitis of right shoulder    Seasonal allergies    Impingement syndrome of right shoulder    History of arthroscopic surgery of  shoulder    Osteoarthritis of right knee, unspecified osteoarthritis type      Past Medical History:   Diagnosis Date    Diabetes (HCC)     Erythema nodosum     H/O mammogram 09/10/2016    Kidney stones     PFS (patellofemoral syndrome)     Knee pain    RAD (reactive airway disease)        Past Surgical History:   Procedure Laterality Date    COLONOSCOPY  07/08/2012    WNL    COLONOSCOPY  02/24/2015    ESOPHAGOGASTRODUODENOSCOPY (EGD)  02/24/2015    LIGATION, FALLOPIAN TUBE, BILATERAL, USING TUBAL RING  1994        Outpatient Medications Marked as Taking for the 09/19/20 encounter (Office Visit) with Shon Baton, NP   Medication Sig Dispense Refill    Azelastine Nasal (ASTELIN) 137 mcg (0.1 %) Spray INSTILL 1 SPRAY INTO EACH NOSTRIL 2 TIMES DAILY. 30 mL 1    Benzonatate (TESSALON) 100 mg Capsule Take 1 capsule by mouth three times daily if needed for cough. 30 capsule 0    FIBER, PSYLLIUM HUSK, PO       Fluticasone (FLONASE) 50 mcg/actuation nasal spray 1 spray each nostril twice daily 16 g 3    Iron 18 mg  Tablet Take by mouth.      L.acid/L.casei/B.bif/B.lon/FOS (PROBIOTIC BLEND PO) once daily if needed.                 Lisinopril (PRINIVIL, ZESTRIL) 2.5 mg Tablet Take 1 tablet by mouth every day. 90 tablet 1    MAGNESIUM PO Take by mouth.      Metformin (GLUCOPHAGE) 1,000 mg tablet Take 1 tablet by mouth 2 times daily with meals. 180 tablet 1    multivit,calc,mins/iron/folic (ONE-A-DAY WOMENS FORMULA PO) Take by mouth.      MULTIVITAMIN PO Twice a day      MV-MIN/VIT C/GLUT/LYSINE/HC124 (A[place]ORNE, WITH LYSINE ACETATE, PO) Seasonal                 naproxen sodium (ALEVE PO) Take by mouth 2 times daily.      Simvastatin (ZOCOR) 10 mg Tablet Take 1 tablet by mouth every day. 90 tablet 1    TURMERIC PO Take by mouth every day.       No Known Allergies    Objective:   Blood pressure 126/70, pulse 92, temperature (!) 35.8 C (96.4 F), temperature source Temporal, resp. rate 12,  height 1.626 m (5\' 4" ), weight 82 kg (180 lb 12.8 oz), SpO2 99 %.   No LMP recorded. Patient is perimenopausal.  Body mass index is 31.03 kg/m.    Physical Exam  Cardiovascular:      Rate and Rhythm: Normal rate and regular rhythm.      Heart sounds: Normal heart sounds.   Pulmonary:      Effort: Pulmonary effort is normal. No respiratory distress.      Breath sounds: Normal breath sounds.   Musculoskeletal:      Right lower leg: No edema.      Left lower leg: No edema.      Comments: Negative drawer test  Mild peripatellar effusion noted  Negative varus valgus test  Gait favoring right leg  No erythema present  No posterior popliteal pain on palpation   Skin:     Capillary Refill: Capillary refill takes less than 2 seconds.   Neurological:      Mental Status: She is alert.       [name] (patient):  We are pleased to be able to share with you the actual progress note of your recent visit.  Thank you for your interest in your health and taking the time to review the note.   If you have questions about the meaning or medical terms being used, please schedule an appointment or bring it up at your next follow-up appointment.  The primary purpose of the note is as a Evlyn Clines between medical professionals and this requires medical terms to be used for efficiency and clarity.  We know you may not understand everything so it is best to review this at your next visit.       You can also look up terms via on-line medical dictionaries if needed, we suggest Medline Plus at   Public [email or web address]

## 2020-09-21 NOTE — Progress Notes (Signed)
Results released to patient online.

## 2020-09-22 ENCOUNTER — Ambulatory Visit: Admitting: Rehabilitative and Restorative Service Providers"

## 2020-09-22 DIAGNOSIS — M25511 Pain in right shoulder: Secondary | ICD-10-CM

## 2020-09-22 NOTE — Allied Health Progress (Signed)
Physical Therapy Treatment Note: 09/22/2020   Covid -19 patient screen completed no fever, no SOB, no sore throat, no vomiting or diarrhea, no cough, no chills, no loss of taste or smell, no known exposure to others with symptoms. Patient is wearing a mask.    Treatment start time:1335Treatment stop time:1402              Total # of visits: 19/22    Total Treatment Time: 27 minutes        Subjective: still right shoulder pain but less    Objective: Patient seen for the following treatment:   Therapeutic exercises for stretches to right shoulder to increase flexion, abductionandER.  Manual therapy59minutes for soft tissue mobilization to right upper trapezius, levator scapulae, rhomboids, pectoralis majorand deltoids, right shoulder rhythmic stabilization, D2 PNF.    Assessment: right shoulder strength gradually improving    Plan: continue physical therapy - as per plan of care.     Cala Bradford, PT

## 2020-09-24 ENCOUNTER — Encounter (HOSPITAL_BASED_OUTPATIENT_CLINIC_OR_DEPARTMENT_OTHER): Payer: Self-pay | Admitting: Internal Medicine

## 2020-10-01 ENCOUNTER — Ambulatory Visit: Admitting: Rehabilitative and Restorative Service Providers"

## 2020-10-01 DIAGNOSIS — M25511 Pain in right shoulder: Secondary | ICD-10-CM

## 2020-10-01 NOTE — Allied Health Progress (Signed)
Physical Therapy Treatment Note: 10/01/2020   Covid -19 patient screen completed no fever, no SOB, no sore throat, no vomiting or diarrhea, no cough, no chills, no loss of taste or smell, no known exposure to others with symptoms. Patient is wearing a mask.    Treatment start time:835Treatment stop time:903             Total # of visits: 20/22    Total Treatment Time: 28 minutes        Subjective: no new complaints     Objective: Patient seen for the following treatment:   Therapeutic exercises for stretches to right shoulder to increase flexion, abductionandERright shoulder ER in left sidelying 2 x10 reps using 1 pound, lean over raised treatment table right shoulder horizontal abduction 2x10 reps.  Manual therapy62minutes for soft tissue mobilization to right upper trapezius, levator scapulae, rhomboids, pectoralis majorand deltoids, right shoulder rhythmic stabilization, D2 PNF.      Assessment: has maintained gains in right shoulder ROM.     Plan: continue physical therapy - as per plan of care.     Cala Bradford, PT

## 2020-10-06 ENCOUNTER — Ambulatory Visit (HOSPITAL_BASED_OUTPATIENT_CLINIC_OR_DEPARTMENT_OTHER): Admitting: Physician Assistant

## 2020-10-06 ENCOUNTER — Encounter (HOSPITAL_BASED_OUTPATIENT_CLINIC_OR_DEPARTMENT_OTHER): Payer: Self-pay | Admitting: Physician Assistant

## 2020-10-06 VITALS — Temp 97.3°F | Ht 64.0 in | Wt 180.0 lb

## 2020-10-06 DIAGNOSIS — S8991XA Unspecified injury of right lower leg, initial encounter: Secondary | ICD-10-CM

## 2020-10-06 DIAGNOSIS — M1711 Unilateral primary osteoarthritis, right knee: Secondary | ICD-10-CM

## 2020-10-06 MED ORDER — METHYLPREDNISOLONE ACETATE 40 MG/ML SUSPENSION FOR INJECTION
40.0000 mg | Freq: Once | INTRAMUSCULAR | Status: AC
Start: 2020-10-06 — End: 2020-10-06
  Administered 2020-10-06: 40 mg via INTRA_ARTICULAR

## 2020-10-06 NOTE — Progress Notes (Addendum)
CHIEF COMPLAINT:  Right knee pain    HISTORY OF PRESENT ILLNESS:  This is a 55 year old female with Type II DM who presents today with complaints of right knee pain. Injury at the end of October when she was switching seats in a car and injured the right knee somehow. Since then, has had some swelling and increased pain. No bruising at time of injury. Has had some issues in the past with the knee. Cortisone injections about 9 years ago which helped with pain and swelling at the time. She has been having symptoms of instability with the knee since the injury, specifically on uneven ground and with stairs. She has been wearing a brace which makes her feel more stable. States the pain can wake her up or keep her up at night. Pain mostly about medial joint line. No numbness or tingling in extremity. Does admit to clicking, catching, and popping in the knee. Morning stiffness and positive theatre sign. She walks a lot for exercise but hasn't been able to do this as much due to the pain. She has been doing NSAIDs, tylenol, voltaren, ice as needed. Presented to the ED on 12/13 as the pain had become unbearable. No evidence of infection. Has not attended PT or had injections for this issue. Pt is visibly uncomfortable in office today and may not tolerate PT.     Ht 1.626 m (5\' 4" )   Wt 81.6 kg (180 lb)   BMI 30.90 kg/m     PAST MEDICAL HISTORY:   Past Medical History:   Diagnosis Date    Diabetes (HCC)     Erythema nodosum     H/O mammogram 09/10/2016    Kidney stones     PFS (patellofemoral syndrome)     Knee pain    RAD (reactive airway disease)        MEDICATIONS:   Current Outpatient Medications:     Azelastine Nasal (ASTELIN) 137 mcg (0.1 %) Spray, INSTILL 1 SPRAY INTO EACH NOSTRIL 2 TIMES DAILY., Disp: 30 mL, Rfl: 1    Benzonatate (TESSALON) 100 mg Capsule, Take 1 capsule by mouth three times daily if needed for cough., Disp: 30 capsule, Rfl: 0    FIBER, PSYLLIUM HUSK, PO, , Disp: , Rfl:     Fluticasone  (FLONASE) 50 mcg/actuation nasal spray, 1 spray each nostril twice daily, Disp: 16 g, Rfl: 3    Iron 18 mg Tablet, Take by mouth., Disp: , Rfl:     L.acid/L.casei/B.bif/B.lon/FOS (PROBIOTIC BLEND PO), once daily if needed.       , Disp: , Rfl:     Lisinopril (PRINIVIL, ZESTRIL) 2.5 mg Tablet, Take 1 tablet by mouth every day., Disp: 90 tablet, Rfl: 1    MAGNESIUM PO, Take by mouth., Disp: , Rfl:     Metformin (GLUCOPHAGE) 1,000 mg tablet, Take 1 tablet by mouth 2 times daily with meals., Disp: 180 tablet, Rfl: 1    multivit,calc,mins/iron/folic (ONE-A-DAY WOMENS FORMULA PO), Take by mouth., Disp: , Rfl:     MULTIVITAMIN PO, Twice a day, Disp: , Rfl:     MV-MIN/VIT C/GLUT/LYSINE/HC124 (AIRBORNE, WITH LYSINE ACETATE, PO), Seasonal       , Disp: , Rfl:     naproxen sodium (ALEVE PO), Take by mouth 2 times daily., Disp: , Rfl:     Simvastatin (ZOCOR) 10 mg Tablet, Take 1 tablet by mouth every day., Disp: 90 tablet, Rfl: 1    TURMERIC PO, Take by mouth every day., Disp: ,  Rfl:     ALLERGIES: Patient has no known allergies.    PAST SURGICAL HISTORY:   Past Surgical History:   Procedure Laterality Date    COLONOSCOPY  07/08/2012    WNL    COLONOSCOPY  02/24/2015    ESOPHAGOGASTRODUODENOSCOPY (EGD)  02/24/2015    LIGATION, FALLOPIAN TUBE, BILATERAL, USING TUBAL RING  1994       SOCIAL HISTORY:   Social History     Socioeconomic History    Marital status: MARRIED     Spouse name: Not on file    Number of children: Not on file    Years of education: Not on file    Highest education level: Not on file   Occupational History    Not on file   Tobacco Use    Smoking status: Never Smoker    Smokeless tobacco: Never Used   Substance and Sexual Activity    Alcohol use: No    Drug use: No    Sexual activity: Not on file   Other Topics Concern    Not on file   Social History Narrative    Not on file     Social Determinants of Health     Financial Resource Strain: Not on file   Food Insecurity: Not on file    Transportation Needs: Not on file   Physical Activity: Not on file   Stress: Not on file   Social Connections: Not on file   Intimate Partner Violence: Not on file   Housing Stability: Not on file       FAMILY HISTORY:   Family History   Problem Relation Name Age of Onset    Arthritis Mother          Rheumitoid    Lipids Mother      Diabetes Father      Other (Other) Father          Kidney faliure    No Known Problems Sister      No Known Problems Brother      No Known Problems Brother      No Known Problems Brother      No Known Problems Daughter      No Known Problems Daughter      No Known Problems Daughter      Other (Other) Son          pulmonary    Diabetes Son          childhood       ROS: A 14 point review of systems is reviewed and is negative with the exception of what is listed in the HPI.    PHYSICAL EXAMINATION  GENERAL: Well appearing, in no apparent distress.  PSYCHOLOGICAL: Alert and oriented x3, mood and affect appropriate for visit.  EYES: Extraocular movements are intact, no scleral icterus.  NEUROLOGICAL: CN II-XII grossly intact.  SKIN: Skin overlying the injured/operative extremity shows no signs of skin changes or rashes.  RESPIRATORY: Normal respiratory effort with no audible wheezing.  CARDIOVASCULAR: The patient has a 2+ radial pulse, symmetric bilaterally.  MUSCULOSKELETAL:      Left knee:  Inspection:  no effusion or erythema, no ecchymosis present, no significant swelling  Alignment:  normal  Palpation:  no specific tenderness on palpation  Wounds:  no surgical wounds are appreciated  ROM:  full flexion and extension  Strength: 5/5 flexion, 5/5 extension  Stability:  all ligaments appear stable  Leg examination: NVI intact distally  .  Right knee:  Inspection: swelling about medial joint line; no effusion or erythema, no ecchymosis present  Alignment:  normal  Palpation:  Tenderness about medial joint line  Wounds:  no surgical wounds are appreciated  ROM: decreased  flexion and extension due to discomfort and some stiffness and guarding   Strength: 5/5 flexion, 5/5 extension  Stability:  all ligaments appear stable  Leg examination: NVI intact distally   Positive McMurray's to pain; positive valgus and varus stress tests; negative anterior and posterior drawer, negative Lachman's     DIAGNOSTIC IMAGING:  AP, lateral, tunnel view, and oblique xrays of right knee reveal mild to moderate tricompartmental arthritis, worse in the medial compartment with joint space narrowing, subchondral sclerosis, and small osteophyte formation. Possible small baker's cyst. No evidence of acute fractures, dislocations, or bony abnormalities noted.     IMPRESSION/DIAGNOSIS:     ICD-10-CM    1. Right knee injury, initial encounter  S89.91XA    2. Arthritis of right knee  M17.11        PLAN:  Imaging and diagnosis reviewed with the pt and options were discussed including conservative therapies, PT, injections. I do not think pt would tolerate PT given positive physical exam signs and guarding of the knee due to pain. She would like to try cortisone injection today and this was performed. MRI ordered to r/o soft tissue injuries and may be used for surgical planning. Continue with conservative therapies and follow-up as needed.     After obtaining written consent from the patient and confirming the diagnosis, indication for treatment, laterality, and drug allergies, the right knee joint was prepped with betadine and topical anesthesia delivered with evaporative coolant spray, then the knee was aspirated and injected with 50mL of lidocaine 1% plain and 40mg  of depomedrol.  The patient tolerated injection well and the injection site was dressed with a bandaid.    , PA-C  [facility]  [address], [place], [place]  [address], [address] [place], [place]  [phone]  10/06/2020  12/04/2020

## 2020-10-07 ENCOUNTER — Ambulatory Visit: Attending: Internal Medicine | Admitting: Rehabilitative and Restorative Service Providers"

## 2020-10-07 DIAGNOSIS — M25511 Pain in right shoulder: Secondary | ICD-10-CM | POA: Insufficient documentation

## 2020-10-07 DIAGNOSIS — G8929 Other chronic pain: Secondary | ICD-10-CM | POA: Insufficient documentation

## 2020-10-07 NOTE — Allied Health Progress (Signed)
Physical Therapy Treatment Note: 10/07/2020   Covid -19 patient screen completed no fever, no SOB, no sore throat, no vomiting or diarrhea, no cough, no chills, no loss of taste or smell, no known exposure to others with symptoms. Patient is wearing a mask.    Treatment start time:1102Treatment stop time:1130            Total # of visits: 21/22    Total Treatment Time: 28 minutes      Subjective: still has right shoulder and cervical pain    Objective: Patient seen for the following treatment:   Therapeutic exercises for stretches to right shoulder to increase flexion, abductionandERright shoulder ER in left sidelying 2 x10 repsusing 1 pound, lean over raised treatment table right shoulder horizontal abduction 2x10 reps.  Manual therapy37minutes for soft tissue mobilization to right upper trapezius, levator scapulae, rhomboids, pectoralis majorand deltoids, right shoulder rhythmic stabilization, D2 PNF.    Assessment: has functional ROM right shoulder but still has pain and decreased strength.    Plan: continue physical therapy - as per plan of care.     Cala Bradford, PT

## 2020-10-11 ENCOUNTER — Other Ambulatory Visit (HOSPITAL_BASED_OUTPATIENT_CLINIC_OR_DEPARTMENT_OTHER): Payer: Self-pay | Admitting: Internal Medicine

## 2020-10-11 DIAGNOSIS — J019 Acute sinusitis, unspecified: Secondary | ICD-10-CM

## 2020-10-12 ENCOUNTER — Other Ambulatory Visit (HOSPITAL_BASED_OUTPATIENT_CLINIC_OR_DEPARTMENT_OTHER): Payer: Self-pay | Admitting: Internal Medicine

## 2020-10-12 DIAGNOSIS — I10 Essential (primary) hypertension: Secondary | ICD-10-CM

## 2020-10-12 DIAGNOSIS — E1169 Type 2 diabetes mellitus with other specified complication: Secondary | ICD-10-CM

## 2020-10-12 DIAGNOSIS — E785 Hyperlipidemia, unspecified: Secondary | ICD-10-CM

## 2020-10-13 ENCOUNTER — Ambulatory Visit: Admitting: Rehabilitative and Restorative Service Providers"

## 2020-10-13 NOTE — Telephone Encounter (Signed)
BP Readings from Last 3 Encounters:   09/19/20 126/70   09/15/20 127/69   07/22/20 131/86     Lab Results   Component Value Date    CR 0.65 07/10/2020    LDLC 63 03/14/2020      Recent Visits  Date Type Provider Dept   09/19/20 Office Visit Shon Baton, NP [facility]   07/23/20 Telephone Lewkowitz, Elmarie Mainland, [place] [facility]   07/23/20 Telephone Lewkowitz, Elmarie Mainland, PA [facility]   07/22/20 Office Visit Jose Persia, Kirt Boys, NP [facility]   07/21/20 Telephone Lewkowitz, Elmarie Mainland, PA [facility]   07/16/20 Office Visit Audrea Muscat, [place] [facility]   05/29/20 Office Visit Audrea Muscat, [place] [facility]   04/15/20 Office Visit Audrea Muscat, [place] [facility]   03/13/20 Office Visit Lewkowitz, Elmarie Mainland, PA [facility]   Showing recent visits within past 365 days with a meds authorizing provider and meeting all other requirements  Future Appointments  Date Type Provider Dept   10/16/20 Appointment Lewkowitz, Elmarie Mainland, PA [facility]   Showing future appointments within next 180 days with a meds authorizing provider and meeting all other requirements    BP Medications in Epic (for confirmation)  Hypertensive Medication             Lisinopril (PRINIVIL, ZESTRIL) 2.5 mg Tablet Take 1 tablet by mouth every day.        Lab Results   Component Value Date    AST 26 07/10/2020    ALT 29 07/10/2020    CHOL 123 03/14/2020    LDLC 63 03/14/2020    LDLC 113 (H) 08/12/2017    HDL 36 (L) 03/14/2020    TRIG 120 03/14/2020   Lipid Medications in Epic (for confirmation)  Lipid Medication             Simvastatin (ZOCOR) 10 mg Tablet Take 1 tablet by mouth every day.        Lab Results   Component Value Date    HGBA1C 6.9 (H) 07/10/2020    HGBA1C 7.0 (H) 03/14/2020    HGBA1C 6.5 (H) 12/08/2017      DM Medications in Epic (for  confirmation)  Diabetes Medication             Metformin (GLUCOPHAGE) 1,000 mg tablet Take 1 tablet by mouth 2 times daily with meals.

## 2020-10-13 NOTE — Telephone Encounter (Signed)
BP Readings from Last 1 Encounters:   09/19/20 126/70      Pulse Readings from Last 1 Encounters:   09/19/20 92     Lab Results   Component Value Date    WBC 5.7 03/14/2020    HGB 13.0 03/14/2020    PLT 237 03/14/2020    NA 139 07/10/2020    K 4.0 07/10/2020    GLU 115 (H) 07/10/2020    CR 0.65 07/10/2020    ALT 29 07/10/2020     Recent Visits  Date Type Provider Dept   09/19/20 Office Visit Shon Baton, NP [facility]   07/23/20 Telephone Lewkowitz, Elmarie Mainland, PA [facility]   07/23/20 Telephone Lewkowitz, Elmarie Mainland, PA [facility]   07/22/20 Office Visit Jose Persia, Kirt Boys, NP [facility]   07/21/20 Telephone Lewkowitz, Elmarie Mainland, [facility]   07/16/20 Office Visit Lewkowitz, Elmarie Mainland, [place] [facility]   05/29/20 Office Visit Audrea Muscat, [place] [facility]   04/15/20 Office Visit Audrea Muscat, [place] [facility]   03/13/20 Office Visit Lewkowitz, Elmarie Mainland, PA [facility]   Showing recent visits within past 365 days with a meds authorizing provider and meeting all other requirements  Future Appointments  Date Type Provider Dept   10/16/20 Appointment Davonna Belling, Elmarie Mainland, PA [facility]   Showing future appointments within next 180 days with a meds authorizing provider and meeting all other requirements        CURES Audit Trail          User Date Status                 Current Medications  has a current medication list which includes the following prescription(s): azelastine nasal, benzonatate, psyllium husk, fluticasone, iron, l.acid/l.casei/b.bif/b.lon/fos, lisinopril, magnesium, metformin, multivit,calc,mins/iron/folic, multivitamin, mv-min/vit c/glut/lysine/hc124, naproxen sodium, simvastatin, and turmeric.

## 2020-10-14 ENCOUNTER — Ambulatory Visit (HOSPITAL_BASED_OUTPATIENT_CLINIC_OR_DEPARTMENT_OTHER): Admitting: Physician Assistant

## 2020-10-14 ENCOUNTER — Encounter (HOSPITAL_BASED_OUTPATIENT_CLINIC_OR_DEPARTMENT_OTHER): Payer: Self-pay | Admitting: Physician Assistant

## 2020-10-14 VITALS — Temp 98.0°F | Ht 64.0 in | Wt 180.0 lb

## 2020-10-14 DIAGNOSIS — M7062 Trochanteric bursitis, left hip: Secondary | ICD-10-CM

## 2020-10-14 DIAGNOSIS — M7061 Trochanteric bursitis, right hip: Secondary | ICD-10-CM

## 2020-10-14 MED ORDER — METHYLPREDNISOLONE ACETATE 40 MG/ML SUSPENSION FOR INJECTION
40.0000 mg | Freq: Once | INTRAMUSCULAR | Status: AC
Start: 2020-10-14 — End: 2020-10-14
  Administered 2020-10-14: 40 mg via INTRAMUSCULAR

## 2020-10-14 NOTE — Addendum Note (Signed)
Addended by: Fanny Dance on: 10/14/2020 04:04 PM     Modules accepted: Orders

## 2020-10-14 NOTE — Progress Notes (Signed)
CHIEF COMPLAINT:  Right hip injection    HISTORY OF PRESENT ILLNESS:    This is a 55 year old female with Type II DM who presents today with complaints of bilateral hip pain. No injury. Has been dealing with recent injury to right knee and waiting on MRI for that. Complains of pain about lateral hips in area of greater trochanter. Tender to palpation. No numbness or tingling in extremity. No groin pain. No significant weakness. Does tylenol and topical gels without much relief. Can wake her up at night. Right is a bit worse than left. Wanting cortisone injection today which she has not yet had for this issue.     Temp 36.7 C (98 F) (Temporal)   Ht 1.626 m (5\' 4" )   Wt 81.6 kg (180 lb)   BMI 30.90 kg/m     PAST MEDICAL HISTORY:   Past Medical History:   Diagnosis Date    Diabetes (HCC)     Erythema nodosum     H/O mammogram 09/10/2016    Kidney stones     PFS (patellofemoral syndrome)     Knee pain    RAD (reactive airway disease)        MEDICATIONS:   Current Outpatient Medications:     Azelastine Nasal (ASTELIN) 137 mcg (0.1 %) Spray, INSTILL 1 SPRAY INTO EACH NOSTRIL 2 TIMES DAILY., Disp: 30 mL, Rfl: 1    Benzonatate (TESSALON) 100 mg Capsule, Take 1 capsule by mouth three times daily if needed for cough., Disp: 30 capsule, Rfl: 0    FIBER, PSYLLIUM HUSK, PO, , Disp: , Rfl:     Fluticasone (FLONASE) 50 mcg/actuation nasal spray, 1 spray each nostril twice daily, Disp: 16 g, Rfl: 3    Iron 18 mg Tablet, Take by mouth., Disp: , Rfl:     L.acid/L.casei/B.bif/B.lon/FOS (PROBIOTIC BLEND PO), once daily if needed.       , Disp: , Rfl:     Lisinopril (PRINIVIL, ZESTRIL) 2.5 mg Tablet, TAKE 1 TABLET BY MOUTH EVERY DAY, Disp: 90 tablet, Rfl: 1    MAGNESIUM PO, Take by mouth., Disp: , Rfl:     Metformin (GLUCOPHAGE) 1,000 mg tablet, TAKE 1 TABLET BY MOUTH TWICE A DAY WITH MEALS, Disp: 180 tablet, Rfl: 1    multivit,calc,mins/iron/folic (ONE-A-DAY WOMENS FORMULA PO), Take by mouth., Disp: , Rfl:      MULTIVITAMIN PO, Twice a day, Disp: , Rfl:     MV-MIN/VIT C/GLUT/LYSINE/HC124 (AIRBORNE, WITH LYSINE ACETATE, PO), Seasonal       , Disp: , Rfl:     naproxen sodium (ALEVE PO), Take by mouth 2 times daily., Disp: , Rfl:     Simvastatin (ZOCOR) 10 mg Tablet, TAKE 1 TABLET BY MOUTH EVERY DAY, Disp: 90 tablet, Rfl: 1    TURMERIC PO, Take by mouth every day., Disp: , Rfl:     ALLERGIES: Patient has no known allergies.    PAST SURGICAL HISTORY:   Past Surgical History:   Procedure Laterality Date    COLONOSCOPY  07/08/2012    WNL    COLONOSCOPY  02/24/2015    ESOPHAGOGASTRODUODENOSCOPY (EGD)  02/24/2015    LIGATION, FALLOPIAN TUBE, BILATERAL, USING TUBAL RING  1994       SOCIAL HISTORY:   Social History     Socioeconomic History    Marital status: MARRIED     Spouse name: Not on file    Number of children: Not on file    Years of education: Not on  file    Highest education level: Not on file   Occupational History    Not on file   Tobacco Use    Smoking status: Never Smoker    Smokeless tobacco: Never Used   Substance and Sexual Activity    Alcohol use: No    Drug use: No    Sexual activity: Not on file   Other Topics Concern    Not on file   Social History Narrative    Not on file     Social Determinants of Health     Financial Resource Strain: Not on file   Food Insecurity: Not on file   Transportation Needs: Not on file   Physical Activity: Not on file   Stress: Not on file   Social Connections: Not on file   Intimate Partner Violence: Not on file   Housing Stability: Not on file       FAMILY HISTORY:   Family History   Problem Relation Name Age of Onset    Arthritis Mother          Rheumitoid    Lipids Mother      Diabetes Father      Other (Other) Father          Kidney faliure    No Known Problems Sister      No Known Problems Brother      No Known Problems Brother      No Known Problems Brother      No Known Problems Daughter      No Known Problems Daughter      No Known Problems  Daughter      Other (Other) Son          pulmonary    Diabetes Son          childhood       ROS: A 14 point review of systems is reviewed and is negative with the exception of what is listed in the HPI.    PHYSICAL EXAMINATION  GENERAL: Well appearing, in no apparent distress.  PSYCHOLOGICAL: Alert and oriented x3, mood and affect appropriate for visit.  EYES: Extraocular movements are intact, no scleral icterus.  NEUROLOGICAL: CN II-XII grossly intact.  SKIN: Skin overlying the injured/operative extremity shows no signs of skin changes or rashes.  RESPIRATORY: Normal respiratory effort with no audible wheezing.  CARDIOVASCULAR: The patient has a 2+ radial pulse, symmetric bilaterally.  MUSCULOSKELETAL:      Left hip:  Wound:  no wounds appreciated  Palpation:  Tenderness in lateral hip in area of greater trochanter  Stability:  no instability  Vascular:  2+ pedal pulses  Strength:  5/5 all motor groups  Sensation:  intact to light touch  ROM:  no pain with FROM  Leg length:  equal  .   Right hip:  Wound:  no wounds appreciated  Palpation:  Tenderness over lateral hip in area of greater trochanter  Stability:  no instability  Vascular:  2+ pedal pulses  Strength:  5/5 all motor groups  Sensation:  intact to light touch  ROM:  no pain with FROM  Leg length:  equal    DIAGNOSTIC IMAGING:  None today.     IMPRESSION/DIAGNOSIS:     ICD-10-CM    1. Trochanteric bursitis of both hips  M70.61     M70.62        PLAN:  Diagnosis and options reviewed with the pt including conservative therapies, physical therapy, injections. Pt is  dealing with an injury of right knee and waiting on MRI. Would like injection for right hip bursa today and will follow-up in about a week or so for left hip bursa injection if this works well for her.     After obtaining written consent from the patient, confirming the diagnosis, indication for treatment, laterality, and drug allergies, the right hip trochanteric area was sterilely prepped and  topical anesthesia was delivered with evaporative coolant spray, then the trochanteric bursa was aspirated and injected with 4 mL each of lidocaine 1% plain and 40mg  of depomedrol.  The patient tolerated the injection well and the injection site was dressed with a bandaid.  It did relieve the pain.    , PA-C  [facility]  [address], [place], [place]  [address] [place], [place]  [phone]  10/14/2020  12/12/2020

## 2020-10-16 ENCOUNTER — Ambulatory Visit (HOSPITAL_BASED_OUTPATIENT_CLINIC_OR_DEPARTMENT_OTHER): Payer: Self-pay | Admitting: Internal Medicine

## 2020-10-20 ENCOUNTER — Ambulatory Visit: Admitting: Rehabilitative and Restorative Service Providers"

## 2020-10-20 DIAGNOSIS — G8929 Other chronic pain: Secondary | ICD-10-CM

## 2020-10-20 NOTE — Allied Health Progress (Signed)
Physical Therapy Treatment Note and Discharge Report: 10/20/2020   Diagnosis:  Therapy diagnosis:M25.511,G89.29 (ICD-10-CM) - Chronic right shoulder pain  Medical diagnosis:M25.511,G89.29 (ICD-10-CM) - Chronic right shoulder pain    Authorizing MD (First and last name) and Bullitt PA  Onset Date:03/13/2020  Start of Care Date: 03/25/2020   Covid -19 patient screen completed no fever, no SOB, no sore throat, no vomiting or diarrhea, no cough, no chills, no loss of taste or smell, no known exposure to others with symptoms. Patient is wearing a mask.    Treatment start time:1335Treatment stop time:1403            Total # of visits: 22    Total Treatment Time: 28 minutes      Subjective: States on good days right shoulder pain 3-4/10 at worst, on bad days 6-7/10 at worst    Objective: Patient seen for the following treatment:   Therapeutic exercises 78mnutes for stretches to right shoulder to increase flexion, abductionandER right shoulder ER in left sidelying 2 x10 repsusing 1 pound, lean over raised treatment table right shoulder horizontal abduction 2x10 reps.  Manual therapy157mutes for soft tissue mobilization to right upper trapezius, levator scapulae, rhomboids, pectoralis majorand deltoids, right shoulder rhythmic stabilization, D2 PNF.                                      AROM right shoulder                                                                           flexion 160 degrees                                       abduction 155 degrees                                       ER in 90 degrees abduction 90 degrees                                        IR in 90 degrees abduction 70 degrees                                        Horizontal adduction right40degrees                                       Hand behind head right middle finger toT1                                       Hand behind backright thumb to T10    MMT's: right shoulder  grossly graded 4/5    Assessment:right shoulder ROM and strength improved. Patient is pacing her activities to 15-20 minutes at a time to not aggravate her right shoulder pain. Treatment goals partially met.  Goals set on 07/08/2020  Able to tolerate 20 minutes of vacuuming and sweeping with right arm with 5/10 pain at worst. Partially met, sometimes can do 15-20 minutes of this with 4-5/10 pain other times a lot more pain.  Able to wash dishes and cook 20 minutes with right arm with 5/10 pain at worst.MET  Able to do dusting and wiping down household surfaces 20 minutes with 5/10 right arm pain at worst.MET    Plan: Discharge physical therapy on a home exercise program.        Hulan Fray, PT

## 2020-10-21 ENCOUNTER — Telehealth (HOSPITAL_BASED_OUTPATIENT_CLINIC_OR_DEPARTMENT_OTHER): Payer: Self-pay | Admitting: Physician Assistant

## 2020-10-21 MED ORDER — DIAZEPAM 5 MG TABLET
ORAL_TABLET | ORAL | 0 refills | Status: DC
Start: 2020-10-21 — End: 2020-11-11

## 2020-10-21 NOTE — Telephone Encounter (Signed)
Patient called because she has an MRI scheduled for tomorrow morning and wants her anxiety medication sent over to [facility] [place] [address] [place] in [place]. Please advice. Thank you.

## 2020-10-21 NOTE — Telephone Encounter (Signed)
meds for MRI

## 2020-10-22 ENCOUNTER — Ambulatory Visit
Admission: RE | Admit: 2020-10-22 | Discharge: 2020-10-22 | Disposition: A | Discharge status: Home / Self Care | Source: Ambulatory Visit | Attending: Physician Assistant | Admitting: Physician Assistant

## 2020-10-22 DIAGNOSIS — M1711 Unilateral primary osteoarthritis, right knee: Secondary | ICD-10-CM

## 2020-10-22 DIAGNOSIS — S8991XA Unspecified injury of right lower leg, initial encounter: Secondary | ICD-10-CM | POA: Insufficient documentation

## 2020-10-24 ENCOUNTER — Encounter (HOSPITAL_BASED_OUTPATIENT_CLINIC_OR_DEPARTMENT_OTHER): Payer: Self-pay | Admitting: Physician Assistant

## 2020-10-24 ENCOUNTER — Ambulatory Visit (HOSPITAL_BASED_OUTPATIENT_CLINIC_OR_DEPARTMENT_OTHER): Admitting: Physician Assistant

## 2020-10-24 VITALS — Temp 97.4°F | Ht 64.0 in | Wt 180.0 lb

## 2020-10-24 DIAGNOSIS — M7062 Trochanteric bursitis, left hip: Secondary | ICD-10-CM

## 2020-10-24 DIAGNOSIS — S83281D Other tear of lateral meniscus, current injury, right knee, subsequent encounter: Secondary | ICD-10-CM

## 2020-10-24 DIAGNOSIS — S83241D Other tear of medial meniscus, current injury, right knee, subsequent encounter: Secondary | ICD-10-CM

## 2020-10-24 DIAGNOSIS — M1711 Unilateral primary osteoarthritis, right knee: Secondary | ICD-10-CM

## 2020-10-24 MED ORDER — METHYLPREDNISOLONE ACETATE 40 MG/ML SUSPENSION FOR INJECTION
40.0000 mg | Freq: Once | INTRAMUSCULAR | Status: AC
Start: 2020-10-24 — End: 2020-10-24
  Administered 2020-10-24: 40 mg via INTRAMUSCULAR

## 2020-10-24 NOTE — Progress Notes (Signed)
CHIEF COMPLAINT:  Left hip injection and right knee MRI review    HISTORY OF PRESENT ILLNESS:  This is a 55 year old female with Type II DM who has been seen previously for bilateral hip pain. She received injection for right hip bursitis recently which significantly relieved her pain. She is here today for injection of left hip bursa.     She has also been dealing with a knee injury that occurred at the end of October. MRI has been ordered and she is here today to review this also. Cortisone injections and other conservative therapies have been tried and failed. Pt states she may be interested in surgical intervention.     Temp 36.3 C (97.4 F) (Temporal)   Ht 1.626 m (5\' 4" )   Wt 81.6 kg (180 lb)   BMI 30.90 kg/m     PAST MEDICAL HISTORY:   Past Medical History:   Diagnosis Date    Diabetes (HCC)     Erythema nodosum     H/O mammogram 09/10/2016    Kidney stones     PFS (patellofemoral syndrome)     Knee pain    RAD (reactive airway disease)        MEDICATIONS:   Current Outpatient Medications:     Azelastine Nasal (ASTELIN) 137 mcg (0.1 %) Spray, INSTILL 1 SPRAY INTO EACH NOSTRIL 2 TIMES DAILY., Disp: 30 mL, Rfl: 1    Benzonatate (TESSALON) 100 mg Capsule, Take 1 capsule by mouth three times daily if needed for cough., Disp: 30 capsule, Rfl: 0    Diazepam (VALIUM) 5 mg Tablet, Take one table by mouth one hour prior to MRI study for anxiety/claustrophobia. Take second tablet one half hour prior to MRI study for continued anxiety/claustrophobia, Disp: 2 tablet, Rfl: 0    FIBER, PSYLLIUM HUSK, PO, , Disp: , Rfl:     Fluticasone (FLONASE) 50 mcg/actuation nasal spray, 1 spray each nostril twice daily, Disp: 16 g, Rfl: 3    Iron 18 mg Tablet, Take by mouth., Disp: , Rfl:     L.acid/L.casei/B.bif/B.lon/FOS (PROBIOTIC BLEND PO), once daily if needed.       , Disp: , Rfl:     Lisinopril (PRINIVIL, ZESTRIL) 2.5 mg Tablet, TAKE 1 TABLET BY MOUTH EVERY DAY, Disp: 90 tablet, Rfl: 1    MAGNESIUM PO, Take  by mouth., Disp: , Rfl:     Metformin (GLUCOPHAGE) 1,000 mg tablet, TAKE 1 TABLET BY MOUTH TWICE A DAY WITH MEALS, Disp: 180 tablet, Rfl: 1    multivit,calc,mins/iron/folic (ONE-A-DAY WOMENS FORMULA PO), Take by mouth., Disp: , Rfl:     MULTIVITAMIN PO, Twice a day, Disp: , Rfl:     MV-MIN/VIT C/GLUT/LYSINE/HC124 (AIRBORNE, WITH LYSINE ACETATE, PO), Seasonal       , Disp: , Rfl:     naproxen sodium (ALEVE PO), Take by mouth 2 times daily., Disp: , Rfl:     Simvastatin (ZOCOR) 10 mg Tablet, TAKE 1 TABLET BY MOUTH EVERY DAY, Disp: 90 tablet, Rfl: 1    TURMERIC PO, Take by mouth every day., Disp: , Rfl:     ALLERGIES: Patient has no known allergies.    PAST SURGICAL HISTORY:   Past Surgical History:   Procedure Laterality Date    COLONOSCOPY  07/08/2012    WNL    COLONOSCOPY  02/24/2015    ESOPHAGOGASTRODUODENOSCOPY (EGD)  02/24/2015    LIGATION, FALLOPIAN TUBE, BILATERAL, USING TUBAL RING  1994       SOCIAL HISTORY:  Social History     Socioeconomic History    Marital status: MARRIED     Spouse name: Not on file    Number of children: Not on file    Years of education: Not on file    Highest education level: Not on file   Occupational History    Not on file   Tobacco Use    Smoking status: Never Smoker    Smokeless tobacco: Never Used   Substance and Sexual Activity    Alcohol use: No    Drug use: No    Sexual activity: Not on file   Other Topics Concern    Not on file   Social History Narrative    Not on file     Social Determinants of Health     Financial Resource Strain: Not on file   Food Insecurity: Not on file   Transportation Needs: Not on file   Physical Activity: Not on file   Stress: Not on file   Social Connections: Not on file   Intimate Partner Violence: Not on file   Housing Stability: Not on file       FAMILY HISTORY:   Family History   Problem Relation Name Age of Onset    Arthritis Mother          Rheumitoid    Lipids Mother      Diabetes Father      Other (Other) Father           Kidney faliure    No Known Problems Sister      No Known Problems Brother      No Known Problems Brother      No Known Problems Brother      No Known Problems Daughter      No Known Problems Daughter      No Known Problems Daughter      Other (Other) Son          pulmonary    Diabetes Son          childhood       ROS: A 14 point review of systems is reviewed and is negative with the exception of what is listed in the HPI.    PHYSICAL EXAMINATION  GENERAL: Well appearing, in no apparent distress.  PSYCHOLOGICAL: Alert and oriented x3, mood and affect appropriate for visit.  EYES: Extraocular movements are intact, no scleral icterus.  NEUROLOGICAL: CN II-XII grossly intact.  SKIN: Skin overlying the injured/operative extremity shows no signs of skin changes or rashes.  RESPIRATORY: Normal respiratory effort with no audible wheezing.  CARDIOVASCULAR: The patient has a 2+ radial pulse, symmetric bilaterally.  MUSCULOSKELETAL:      Left hip:  Wound:  no wounds appreciated  Palpation:  Tenderness about lateral hip in area of greater trochanter   Stability:  no instability  Vascular:  2+ pedal pulses  Strength:  5/5 all motor groups  Sensation:  intact to light touch  ROM:  no pain with FROM  Leg length:  equal  .   Right hip:  Wound:  no wounds appreciated  Palpation:  non-tender  Stability:  no instability  Vascular:  2+ pedal pulses  Strength:  5/5 all motor groups  Sensation:  intact to light touch  ROM:  no pain with FROM  Leg length:  Equal    Left knee:  Inspection:  no effusion or erythema, no ecchymosis present, no significant swelling  Alignment:  normal  Palpation:  no specific tenderness on palpation  Wounds:  no surgical wounds are appreciated  ROM:  full flexion and extension  Strength: 5/5 flexion, 5/5 extension  Stability:  all ligaments appear stable  Leg examination: NVI intact distally  .  Right knee:  Inspection:  Swelling about medial joint line; no effusion or erythema, no ecchymosis  present  Alignment:  normal  Palpation:  Tenderness about medial and lateral joint lines  Wounds:  no surgical wounds are appreciated  ROM:  Decreased due to discomfort   Strength: 5/5 flexion, 5/5 extension  Stability:  all ligaments appear stable  Leg examination: NVI intact distally    DIAGNOSTIC IMAGING:  MRI of right knee reveals IMPRESSION:  1. Horizontal cleavage tear at body segment of lateral meniscus and free margin truncation at posterior horn of lateral meniscus. Intrinsic degeneration signal versus subtle horizontal cleavage tear at the body segment of medial meniscus, with mild partial extrusion of the body segment of medial meniscus.  2. Moderate tricompartmental osteoarthritis at the knee showing patellofemoral compartment predominance.  3. Low-grade sprain of medial collateral ligament involving the femoral attachment fibers, acute versus subacute.  4. Small knee joint effusion with mild synovitis and suspected small intra-articular loose bodies as described.    IMPRESSION/DIAGNOSIS:      ICD-10-CM    1. Tear of lateral meniscus of right knee, current, unspecified tear type, subsequent encounter  S83.281D    2. Tear of medial meniscus of right knee, current, unspecified tear type, subsequent encounter  S83.241D    3. Arthritis of knee, right  M17.11    4. Trochanteric bursitis of left hip  M70.62        PLAN: Imaging and diagnosis reviewed with pt. Options discussed. Left hip injection performed today. Pt is interested in potential surgical intervention for right knee arthroscopy and will be sent to surgeon for consult. Continue with conservative therapies in the meantime and follow-up as needed.     After obtaining written consent from the patient, confirming the diagnosis, indication for treatment, laterality, and drug allergies, the left hip trochanteric area was sterilely prepped and topical anesthesia was delivered with evaporative coolant spray, then the trochanteric bursa was aspirated and  injected with 4 mL each of lidocaine 1% plain and 1 mL sodium bicarbonate and 40mg  of depomedrol.  The patient tolerated the injection well and the injection site was dressed with a bandaid.  It did relieve the pain.    , PA-C  [facility]  [address], [place], [place]  [address], [address] [place], [place]  [phone]  10/24/2020  10/26/2020

## 2020-10-28 ENCOUNTER — Ambulatory Visit: Attending: Internal Medicine

## 2020-10-28 DIAGNOSIS — E785 Hyperlipidemia, unspecified: Secondary | ICD-10-CM | POA: Insufficient documentation

## 2020-10-28 DIAGNOSIS — E1169 Type 2 diabetes mellitus with other specified complication: Secondary | ICD-10-CM

## 2020-10-28 DIAGNOSIS — E669 Obesity, unspecified: Secondary | ICD-10-CM | POA: Insufficient documentation

## 2020-10-28 LAB — COMPREHENSIVE METABOLIC PANEL
Adjusted Calcium: 9.2 mg/dL (ref 8.7–10.2)
Alanine Transferase (ALT): 24 U/L (ref 4–56)
Alb/Glob Ratio: 1.1 (ref 1.0–1.6)
Albumin: 4 g/dL (ref 3.2–4.7)
Alkaline Phosphatase (ALP): 59 U/L (ref 38–126)
Aspartate Transaminase (AST): 19 U/L (ref 9–44)
BUN/ Creatinine: 32.3 — ABNORMAL HIGH (ref 7.3–21.7)
Bilirubin Total: 0.5 mg/dL (ref 0.1–2.2)
Calcium: 9.2 mg/dL (ref 8.7–10.2)
Carbon Dioxide Total: 28 mmol/L (ref 22–32)
Chloride: 100 mmol/L (ref 99–109)
Creatinine Serum: 0.65 mg/dL (ref 0.50–1.30)
E-GFR, Non-African American: 108 mL/min/{1.73_m2} (ref >60)
E-GFR: 124 mL/min/{1.73_m2} (ref >60)
Globulin: 3.7 g/dL (ref 2.2–4.2)
Glucose: 130 mg/dL — ABNORMAL HIGH (ref 70–99)
Potassium: 3.9 mmol/L (ref 3.5–5.2)
Protein: 7.7 g/dL (ref 5.9–8.2)
Sodium: 136 mmol/L (ref 134–143)
Urea Nitrogen, Blood (BUN): 21 mg/dL (ref 6–21)

## 2020-10-28 LAB — LIPID PANEL
Cholesterol: 146 mg/dL (ref 112–200)
HDL Cholesterol: 42 mg/dL (ref >=40)
LDL Cholesterol Calculation: 90 mg/dL (ref <100)
Non-HDL Cholesterol: 104 mg/dL (ref <=150.0)
Total Cholesterol: HDL Ratio: 3.5 mg/dL (ref 2.0–5.0)
Triglyceride: 69 mg/dL (ref 30–150)

## 2020-10-28 LAB — HEMOGLOBIN A1C
Hgb A1C,Glucose Est Avg: 154 mg/dL
Hgb A1C: 7 % — ABNORMAL HIGH (ref 4.0–5.6)

## 2020-10-28 NOTE — Progress Notes (Signed)
To be reviewed at 10/31/2020 appointment. -ARL

## 2020-10-29 ENCOUNTER — Encounter (HOSPITAL_BASED_OUTPATIENT_CLINIC_OR_DEPARTMENT_OTHER): Payer: Self-pay | Admitting: Internal Medicine

## 2020-10-29 ENCOUNTER — Ambulatory Visit (HOSPITAL_BASED_OUTPATIENT_CLINIC_OR_DEPARTMENT_OTHER): Admitting: Internal Medicine

## 2020-10-29 VITALS — BP 122/76 | HR 94 | Temp 96.8°F | Resp 18 | Ht 64.0 in | Wt 183.0 lb

## 2020-10-29 DIAGNOSIS — F40243 Fear of flying: Secondary | ICD-10-CM

## 2020-10-29 DIAGNOSIS — E785 Hyperlipidemia, unspecified: Secondary | ICD-10-CM

## 2020-10-29 DIAGNOSIS — I10 Essential (primary) hypertension: Secondary | ICD-10-CM

## 2020-10-29 DIAGNOSIS — E1169 Type 2 diabetes mellitus with other specified complication: Secondary | ICD-10-CM

## 2020-10-29 DIAGNOSIS — E669 Obesity, unspecified: Secondary | ICD-10-CM

## 2020-10-29 MED ORDER — ALPRAZOLAM 0.5 MG TABLET
ORAL_TABLET | ORAL | 0 refills | Status: AC
Start: 2020-10-29 — End: 2020-12-27

## 2020-10-29 MED ORDER — BLOOD-GLUCOSE METER KIT
1.0000 | PACK | Freq: Once | 0 refills | Status: DC
Start: 2020-10-29 — End: 2021-01-03

## 2020-10-29 NOTE — Progress Notes (Signed)
Family Practice Clinic Visit     Date: 10/29/2020  Patient Name: Amanda Williamson    CHIEF COMPLAINT:   Chief Complaint   Patient presents with    Diabetes     3 month follow up        Patient WAS wearing a cloth mask during visit  Contact precautions were followed when caring for the patient.   PPE used by provider during encounter: procedure mask      HISTORY OF PRESENT ILLNESS:    Pt returns for routine follow up. Last A1C now 7.0% with fasting glucose 130 mg/dL. Pt admits she has not been as active due there persistent knee pain; now scheduled with Dr. Durene Fruits on 3/23 for lateral meniscus tear of R knee repair. Pt is traveling to [place] on 11/11/20-11/27/2020 with daughter as she states she has been under increased stress with daughters' current pregnancies. Pt states she had a recent diabetic retinopathy examination performed without any issues.  As she will be traveling in near future, patient is requesting Rx for Xanax given her fear of flying.     Review of Systems   Constitutional: Negative.    HENT: Negative.    Respiratory: Negative.    Cardiovascular: Negative.    Gastrointestinal: Negative.    Endocrine: Negative.    Musculoskeletal: Positive for arthralgias and neck pain. Negative for gait problem and neck stiffness.   Skin: Negative.    Neurological: Negative.    Psychiatric/Behavioral: The patient is nervous/anxious.        Note: This is a self-reported Review of Systems.    Vitals: BP 122/76 (SITE: left arm, Orthostatic Position: sitting, Cuff Size: large)   Pulse 94   Temp 36 C (96.8 F) (Temporal)   Resp 18   Ht 1.626 m (_0 )   Wt 83 kg (183 lb)   SpO2 97%   BMI 31.41 kg/m   No LMP recorded. Patient is perimenopausal.    Physical Exam  Constitutional:       Appearance: Normal appearance.   Cardiovascular:      Rate and Rhythm: Normal rate and regular rhythm.      Pulses: Normal pulses.      Heart sounds: Normal heart sounds.   Pulmonary:      Effort: Pulmonary effort is normal.       Breath sounds: Normal breath sounds.   Abdominal:      General: Bowel sounds are normal.      Palpations: Abdomen is soft.   Musculoskeletal:      Comments: R knee in brace    Skin:     General: Skin is warm and dry.   Neurological:      Mental Status: She is alert.          ASSESSMENT AND PLAN:    ICD-10-CM    1. Diabetes mellitus type 2 in obese (HCC)  E11.69 Blood Glucose Meter Kit    E66.9    2. Primary hypertension  I10    3. Dyslipidemia  E78.5    4. 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    Progress notes reviewed and authenticated by Sherilyn Banker, PA-C 10/29/2020 4:36 PM

## 2020-11-06 ENCOUNTER — Ambulatory Visit (HOSPITAL_BASED_OUTPATIENT_CLINIC_OR_DEPARTMENT_OTHER): Admitting: Orthopaedic Surgery

## 2020-11-06 ENCOUNTER — Encounter (HOSPITAL_BASED_OUTPATIENT_CLINIC_OR_DEPARTMENT_OTHER): Payer: Self-pay | Admitting: Orthopaedic Surgery

## 2020-11-06 VITALS — Temp 96.8°F | Ht 64.0 in | Wt 181.1 lb

## 2020-11-06 DIAGNOSIS — S83241D Other tear of medial meniscus, current injury, right knee, subsequent encounter: Secondary | ICD-10-CM

## 2020-11-06 DIAGNOSIS — S83281D Other tear of lateral meniscus, current injury, right knee, subsequent encounter: Secondary | ICD-10-CM

## 2020-11-06 NOTE — Progress Notes (Signed)
[facility] Orthopaedic Surgery [facility] Clinic Note    PATIENT: Amanda Williamson  MR #: [id number]  DOB: 1966/08/06  SEX: female AGE: 60yr  SERVICE DATE: 11/06/2020    Reason for visit:   Chief Complaint   Patient presents with    Consultation     Right knee pain       Subjective:  Amanda Williamson is a 23yr female presents for chronic right knee pain. Has had therapy, cortisone injection and uses brace.  MRI suggest loose body and lateral meniscus tearing.  Patient has severe difficulty with daily activities.     Objective:  Temp 36 C (96.8 F) (Temporal)   Ht 1.626 m (5\' 4" )   Wt 82.1 kg (181 lb 1.6 oz)   BMI 31.09 kg/m     PE:  Gen: NAD, comfortable    Physical Exam  Ortho Exam      right lower extremity:  Exam: Knee.  Positive lateral McMurray's test.  Lateral joint line tenderness.  Stable collateral and cruciate ligaments.  Moderate effusion.  Mild crepitus patellofemoral joint.  Vascular: palpable dorsalis pedis pulse, toes warm and well perfused, capillary refill < 2s  Motor function: intact DF/PF/EHL  ROM: 0 to 115  Sensory: S/S/SP/DP/T intact to light touch         Imaging: IMPRESSION: 1. Horizontal cleavage tear at body segment of lateral meniscus and free margin truncation at posterior horn of lateral meniscus. Intrinsic degeneration signal versus subtle horizontal cleavage tear at the body segment of medial meniscus, with mild partial extrusion of the body segment of medial meniscus. 2. Moderate tricompartmental osteoarthritis at the knee showing patellofemoral compartment predominance. 3. Low-grade sprain of medial collateral ligament involving the femoral attachment fibers, acute versus subacute. 4. Small knee joint effusion with mild synovitis and suspected small intra-articular loose bodies as described        Assesment and Plan:  Amanda Williamson is a 19yr female with failed conservative treatment for right knee lateral meniscus tear.  Recommend operative arthroscopy.  Risks of bleeding,  infection, nerve damage, incomplete relief of or recurrence of symptoms, osteoarthritis and need for knee replacement in the future were discussed.  Possibility of DVT and pulmonary embolus discussed.  Follow-up postoperatively.    41yr Amanda Williamson,M.D.  11/06/2020

## 2020-12-03 ENCOUNTER — Ambulatory Visit
Admission: RE | Admit: 2020-12-03 | Discharge: 2020-12-03 | Disposition: A | Discharge status: Home / Self Care | Source: Ambulatory Visit | Attending: Family Medicine | Admitting: Family Medicine

## 2020-12-03 DIAGNOSIS — Z1231 Encounter for screening mammogram for malignant neoplasm of breast: Secondary | ICD-10-CM | POA: Insufficient documentation

## 2020-12-05 ENCOUNTER — Encounter (HOSPITAL_BASED_OUTPATIENT_CLINIC_OR_DEPARTMENT_OTHER): Payer: Self-pay | Admitting: Orthopaedic Surgery

## 2020-12-05 ENCOUNTER — Ambulatory Visit (HOSPITAL_BASED_OUTPATIENT_CLINIC_OR_DEPARTMENT_OTHER): Admitting: Orthopaedic Surgery

## 2020-12-05 VITALS — BP 115/70 | HR 86 | Temp 98.2°F | Ht 64.0 in | Wt 181.0 lb

## 2020-12-05 DIAGNOSIS — S83241D Other tear of medial meniscus, current injury, right knee, subsequent encounter: Secondary | ICD-10-CM

## 2020-12-05 DIAGNOSIS — S83281D Other tear of lateral meniscus, current injury, right knee, subsequent encounter: Secondary | ICD-10-CM

## 2020-12-05 MED ORDER — TRAMADOL 50 MG TABLET
50.0000 mg | ORAL_TABLET | Freq: Four times a day (QID) | ORAL | 0 refills | Status: AC | PRN
Start: 2020-12-05 — End: 2020-12-12

## 2020-12-05 MED ORDER — ONDANSETRON HCL 4 MG TABLET
4.0000 mg | ORAL_TABLET | Freq: Three times a day (TID) | ORAL | 0 refills | Status: DC | PRN
Start: 2020-12-05 — End: 2021-01-03

## 2020-12-05 NOTE — Progress Notes (Signed)
[facility]    PATIENT: Amanda Williamson  MR#: [id number]  SEX: female, AGE: 44yr DATE OF BIRTH: [birth year] SERVICE DATE: 12/05/2020      Chief Complaint / Reason for Visit:   Chief Complaint   Patient presents with    Pre-Op Exam     Right Knee Arthroscopy DOS 12/24/2020       Amanda Williamson a 89yrld female who presents with complaints of right knee pain.  MRI has suggested lateral meniscus tear and medial meniscus tear with mild arthrosis.  Patient has failed conservative treatment and has poor quality of life and difficulty ambulating.  Desires to proceed with operative arthroscopy and meniscectomy.  Patient has been wearing a neoprene support brace and had rest and elevation and gentle exercises which have failed to relieve her symptoms.    Past Medical History:   Diagnosis Date    [place] ([facility]    Erythema nodosum     H/O mammogram 09/10/2016    Kidney stones     PFS (patellofemoral syndrome)     Knee pain    RAD (reactive airway disease)        Past Surgical History:   Procedure Laterality Date    COLONOSCOPY  07/08/2012    WNL    COLONOSCOPY  02/24/2015    ESOPHAGOGASTRODUODENOSCOPY (EGD)  02/24/2015    LIGATION, FALLOPIAN TUBE, BILATERAL, USING TUBAL RING  1994       Review of patient's family history indicates:  Problem: Arthritis      Relation: Mother          Age of Onset: (Not Specified)          Comment: Rheumitoid  Problem: Lipids      Relation: Mother          Age of Onset: (Not Specified)  Problem: Diabetes      Relation: Father          Age of Onset: (Not Specified)  Problem: Other (Other)      Relation: Father          Age of Onset: (Not Specified)          Comment: Kidney faliure  Problem: No Known Problems      Relation: Sister          Age of Onset: (Not Specified)  Problem: No Known Problems      Relation: Brother          Age of Onset: (Not Specified)  Problem: No Known Problems      Relation: Brother          Age of Onset: (Not  Specified)  Problem: No Known Problems      Relation: Brother          Age of Onset: (Not Specified)  Problem: No Known Problems      Relation: Daughter          Age of Onset: (Not Specified)  Problem: No Known Problems      Relation: Daughter          Age of Onset: (Not Specified)  Problem: No Known Problems      Relation: Daughter          Age of Onset: (Not Specified)  Problem: Other (Other)      Relation: Son          Age of Onset: (Not Specified)          Comment: pulmonary  Problem: Diabetes      Relation: Son          Age of Onset: (Not Specified)          Comment: childhood        Social History     Socioeconomic History    Marital status: SINGLE   Tobacco Use    Smoking status: Never Smoker    Smokeless tobacco: Never Used   Substance and Sexual Activity    Alcohol use: No    Drug use: No       No Known Allergies      Current Outpatient Medications:     acetaminophen (TYLENOL EX STR ARTHRITIS PAIN PO), Take by mouth two times daily if needed., Disp: , Rfl:     Alprazolam (XANAX) 0.5 mg Tablet, Take 1 tablet by mouth 30 minutes prior to air travel., Disp: 10 tablet, Rfl: 0    Azelastine Nasal (ASTELIN) 137 mcg (0.1 %) Spray, INSTILL 1 SPRAY INTO EACH NOSTRIL 2 TIMES DAILY., Disp: 30 mL, Rfl: 1    Blood Glucose Meter Kit, 1 kit one time., Disp: 1 kit, Rfl: 0    FIBER, PSYLLIUM HUSK, PO, , Disp: , Rfl:     Fluticasone (FLONASE) 50 mcg/actuation nasal spray, 1 spray each nostril twice daily, Disp: 16 g, Rfl: 3    Iron 18 mg Tablet, Take by mouth., Disp: , Rfl:     L.acid/L.casei/B.bif/B.lon/FOS (PROBIOTIC BLEND PO), once daily if needed.       , Disp: , Rfl:     Lisinopril (PRINIVIL, ZESTRIL) 2.5 mg Tablet, TAKE 1 TABLET BY MOUTH EVERY DAY, Disp: 90 tablet, Rfl: 1    MAGNESIUM PO, Take by mouth., Disp: , Rfl:     Metformin (GLUCOPHAGE) 1,000 mg tablet, TAKE 1 TABLET BY MOUTH TWICE A DAY WITH MEALS, Disp: 180 tablet, Rfl: 1    multivit,calc,mins/iron/folic (ONE-A-DAY WOMENS FORMULA PO), Take by  mouth., Disp: , Rfl:     MULTIVITAMIN PO, Twice a day, Disp: , Rfl:     MV-MIN/VIT C/GLUT/LYSINE/HC124 (AIRBORNE, WITH LYSINE ACETATE, PO), Seasonal       , Disp: , Rfl:     naproxen sodium (ALEVE PO), Take by mouth 2 times daily., Disp: , Rfl:     Simvastatin (ZOCOR) 10 mg Tablet, TAKE 1 TABLET BY MOUTH EVERY DAY, Disp: 90 tablet, Rfl: 1    TURMERIC PO, Take by mouth every day., Disp: , Rfl:       REVIEW OF SYSTEMS:  CONSTITUTIONAL: No recent headache or fever  HEENT: No visual changes  RESPIRATORY: Denies shortness of breath  CARDIOVASCULAR: Denies chest pain    BP 115/70 (SITE: left arm, Orthostatic Position: sitting, Cuff Size: regular)   Pulse 86   Temp 36.8 C (98.2 F) (Temporal)   Ht 1.626 m (5' 4")   Wt 82.1 kg (181 lb)   SpO2 98%   BMI 31.07 kg/m     Physical Exam    Ortho Exam      EXAMINATION:  CONSTITUTIONAL: Well developed and well nourished, no acute distress  HEENT: EOMI, sclera clear  RESPIRATORY: Lungs are clear to auscultation  CARDIOVASCULAR: Heart is regular rate and rhythm without murmur    Examination of the right knee reveals medial and lateral joint line tenderness.  Mild effusion.  Collateral ligaments are stable.  Negative drawer test.  Trace crepitus of the patellofemoral joint.      LABS:  I personally reviewed the following labs: Pending  IMAGES:  I personally reviewed the following additional:     Other Images: MRI right knee October 22, 2020:  IMPRESSION:  1. Horizontal cleavage tear at body segment of lateral meniscus and free margin truncation at posterior horn of lateral meniscus. Intrinsic degeneration signal versus subtle horizontal cleavage tear at the body segment of medial meniscus, with mild partial extrusion of the body segment of medial meniscus.  2. Moderate tricompartmental osteoarthritis at the knee showing patellofemoral compartment predominance.  3. Low-grade sprain of medial collateral ligament involving the femoral attachment fibers, acute versus  subacute.  4. Small knee joint effusion with mild synovitis and suspected small intra-articular loose bodies as described.      ASSESSMENT/PLAN:    Right knee meniscus tears.  Failed conservative treatment.  Desires to proceed with knee arthroscopy.  Risk of bleeding, infection, nerve damage, incomplete relief of or recurrence of symptoms, arthritis, arthrofibrosis, deep vein thrombosis, pulmonary embolus discussed and patient desires to proceed with surgery.       Hayden Rasmussen.D.

## 2020-12-07 ENCOUNTER — Other Ambulatory Visit (HOSPITAL_BASED_OUTPATIENT_CLINIC_OR_DEPARTMENT_OTHER): Payer: Self-pay | Admitting: Family Medicine

## 2020-12-07 DIAGNOSIS — J019 Acute sinusitis, unspecified: Secondary | ICD-10-CM

## 2020-12-08 NOTE — Telephone Encounter (Signed)
BP Readings from Last 1 Encounters:   12/05/20 115/70      Pulse Readings from Last 1 Encounters:   12/05/20 86     Lab Results   Component Value Date    WBC 5.7 03/14/2020    HGB 13.0 03/14/2020    PLT 237 03/14/2020    NA 136 10/28/2020    K 3.9 10/28/2020    GLU 130 (H) 10/28/2020    CR 0.65 10/28/2020    ALT 24 10/28/2020     Recent Visits  Date Type Provider Dept   10/29/20 Office Visit Lewkowitz, Elmarie Mainland, [facility]   09/19/20 Office Visit Jose Persia, Kirt Boys, NP [facility]   07/23/20 Telephone Lewkowitz, Elmarie Mainland, [facility]   07/23/20 Telephone Davonna Belling, Elmarie Mainland, PA [facility]   07/22/20 Office Visit Jose Persia, Kirt Boys, NP [facility]   07/21/20 Telephone Lewkowitz, Elmarie Mainland, [facility]   07/16/20 Office Visit Lewkowitz, Elmarie Mainland, [place] [facility]   05/29/20 Office Visit Audrea Muscat, [place] [facility]   04/15/20 Office Visit Audrea Muscat, [place] [facility]   03/13/20 Office Visit Lewkowitz, Elmarie Mainland, PA [facility]   Showing recent visits within past 365 days with a meds authorizing provider and meeting all other requirements  Future Appointments  Date Type Provider Dept   02/26/21 Appointment Davonna Belling, Elmarie Mainland, PA [facility]   Showing future appointments within next 180 days with a meds authorizing provider and meeting all other requirements        [facility] [address]         User Date Status   HAMMOND, Ihor Austin 10/21/2020 12:14 PM  10/29/2020  4:54 PM  12/05/2020 12:43 PM Reviewed PDMP [1]  Reviewed PDMP [1]  Reviewed PDMP [1]             Current Medications  has a current medication list which includes the following prescription(s): acetaminophen, alprazolam, azelastine nasal, blood glucose meter, psyllium husk, fluticasone, iron,  l.acid/l.casei/b.bif/b.lon/fos, lisinopril, magnesium, metformin, multivit,calc,mins/iron/folic, multivitamin, mv-min/vit c/glut/lysine/hc124, naproxen sodium, ondansetron, simvastatin, tramadol, and turmeric.

## 2020-12-19 NOTE — SAC Visit (Signed)
Called patient to remind them of upcoming PAT appointment and COVID test. Bring list of current medications.

## 2020-12-22 ENCOUNTER — Ambulatory Visit

## 2020-12-22 VITALS — BP 134/83 | HR 77 | Temp 96.9°F | Ht 64.0 in | Wt 181.0 lb

## 2020-12-22 DIAGNOSIS — Z01818 Encounter for other preprocedural examination: Secondary | ICD-10-CM

## 2020-12-22 LAB — MMC CORONAVIRUS BY PCR (LAB USE ONLY): Coronavirus by PCR MMC: NEGATIVE

## 2020-12-22 NOTE — SAC Visit (Signed)
ADULT PRE-ADMISSION INSTRUCTIONS AND INFORMATION    Please report to the Admitting Department at [address], [place], [place] [address]  ON: Date 3/23 at arrive TBD    Scheduled procedure/surgery times can change due to cancellations or emergency situations.  Please provide us with a phone number, other than your home, where we may reach you the day of your procedure/surgery should we need you to come in earlier or inform you of delays.  Please be sure the person bringing you is available for your needs.    Be sure you have NOTHING to eat or drink (INCLUDING WATER) after MIDNIGHT on 3/22.  For colonoscopies, follow instructions given to you by your doctor regarding your prep. Please do not chew gum, eat breath mints, hard candies or use chewing tobacco.    Take medications, as directed by your doctor, on the day of surgery with a sip of water.  DIABETIC PATIENTS:  Call your doctor and ask if you should take your diabetic medication before your procedure/surgery.    If you smoke,quit or at least cut down before surgery.    Stop drinking alcohol (liquor, beer and wine) at least 2 days before surgery.    Consult your surgeon about stopping Aspirin and blood thinners (including ibuprofen) prior to surgery.    If you get a fever, cold, wound or rash before your scheduled procedure/surgery, please notify your physician immediately.    Remove your makeup and hairpins before entering the hospital.    DO NOT wear soft or hard contract lenses to the hospital.    DO NOT bring any valuables or jewelry to the hospital.    ALL BODY PIERCINGS MUST BE REMOVED PRIOR TO ADMISSION.    Wear clothing appropriate to procedure/surgery.    If your procedure/surgery is scheduled as an Outpatient, please arrange for a responsible adult to drive you home and to stay with you the first 12 hours after your surgery.    No pets in bed one day before and two weeks after surgery.    Please DO NOT SHAVE any area of your body prior to your  procedure/surgery:      If you have unanswered questions about your procedure/surgery, please bring a list of these questions to the hospital with you.   Your doctor or nurse will make every attempt to provide you with the information you request.    Other Instructions: Patient given approximate time and told OPS would call business day before procedure with the exact time of arrival     Interpreter used no    These instructions were reviewed with the patient PAT      PREPARING YOUR SKIN BEFORE SURGERY  To reduce risk of infection at your surgical site; [facility]   has chosen 2% Chlorhexidine Gluconate (CHG) antiseptic solution disposable wipes.   Warnings:         Do not use the CHG cloths if you are allergic to Chlorhexidine   instead use a NEW container of liquid soap   (non-fragrant, no other additives) for your shower.          Stop using if redness or irritation occurs         Avoid using wipes on your face & genital area. Dispose of wipes in trash, not toilet.         Do not shave any part of your body (this may cause infection).    TWO NIGHTS BEFORE SURGERY:   Before Bed:  1.       Take out all ear and body piercings before proceeding   2.     Shower, wash your hair and rinse thoroughly.   3.     Dry off with a clean towel.  4.     Allow your skin to dry for 15 minutes before applying the wipes.  5.     Take one wipe:         Starting at your neck, down to your hips, front and back.           Wipe skin back and forth for 3 minutes (do not scrub too hard--that may cause irritation).          Pay special attention to carefully cleaning your surgical site.     6.     With the second wipe:         Wipe starting at your hips, cleaning down to toes, front and back. Avoid genital area.         Wipe skin back and forth for 3 minutes (do not scrub too hard--that may cause irritation).          Pay special attention to carefully cleaning your surgical site.     7.     Allow your skin to  air dry.  8.     Do not apply anything to your skin after prepping. (Lotion, moisturizer, deodorant, powder, or make-up, sunscreen.)   9.     Dress in clean sleepwear.     THE NIGHT BEFORE SURGERY:   Before Bed  1.     Repeat steps 1-9 (above).  2.     Dress in clean sleepwear and sleep on clean sheets     THE DAY OF SURGERY:   1.     Shower, rinse and dry off with a clean towel. Dress in clean clothes.  DO NOT apply lotion, moisturizer, deodorant, powder, face/body make-up or jewelry.        Educated patient regarding the use of post op Nozins. Patient verbalized understanding.     Visitors will need to provide proof of COVID 19 vaccination or a negative COVID test result from the past 72 hours    Patient agrees to the following:  All patients are required to have COVID-19 test within 72 hours before surgery.  One visitor per patient the day of surgery. Visitors must wear a mask, use gel hand sanitizer frequently, and may not visist other locations in the facility.  All patients will be asked to bring their own inhalers.

## 2020-12-22 NOTE — Anesthesia Preprocedure Evaluation (Addendum)
Anesthesia Evaluation      Medical History, Review of Systems, and Physical Exam    ROS includes input from PREP Clinic Nurse and Clinic Provider  HPI:54 yo female with right knee bilateral meniscus tear    PMHx:  -HTN  -RAD (reactive airway disease). Only uses inhaler when having bronchitis  -Erythema nodosum  -Kidney stones  -Diabetes   -PFS (patellofemoral syndrome)  -Adhesive capsulitis of right shoulder  -BMI 31.1    PSH:  LIGATION, FALLOPIAN TUBE, BILATERAL, USING TUBAL RING - 1994  COLONOSCOPY - 07/08/2012  COLONOSCOPY - 02/24/2015  ESOPHAGOGASTRODUODENOSCOPY (EGD) - 02/24/2015  History of arthroscopic surgery of shoulder    All:  NKDA    Labs:  12/22/2020 09:40  STAT dry NAAT for COVID [facility]: Negative    10/28/2020 07:29  Glucose: 130 (H)    09/19/2020 09:30  Microalbumin Urine: 49.0 (H)    Pre op BG: 138  Pt denies having any CP, SOB, angina symptoms, GERD symptoms, n/v  NPO >8hrsHistory of anesthetic complications  History of postoperative nausea and vomiting.   No family history of anesthetic complications.    Airway   Mallampati: II  TM distance: >3 FB     Neck ROM is full.     Dental - no notable dental history.        Pulmonary   (+) asthma,        (-) sleep apnea    Patient's breath sounds clear to auscultation. Cardiovascular   (+) hypertension (),  hypertriglyceridemia,   (-) pacemaker, CAD, CABG/stent    Rhythm: regular  Rate: normal  Patient has good exercise tolerance.   Neuro/Psych    (-) no seizures, neuromuscular disease, TIA, CVA GI/Hepatic/Renal   (-) chronic renal disease, GERD and liver disease      Abdominal    Endo/Other    (+) diabetes mellitus type 2, arthritis,   Smoking History    (-) current smoker and ex-smoker                            Anesthesia Plan    ASA 3     general   (Risks, benefits and alternative anesthetics discussed. Patient expresses understanding and accepts risks of general anesthesia not limited to heart, brain, lung and or kidney injury and or death, intraoperative  awareness and or recall, eye, nerve or dental injury, sore throat and or post op nausea and vomiting. Patient accepts risks and agrees to proceed with anesthetic plan.        )  Patient is not a current smoker.    Intravenous induction    Postoperative administration of opioids is intended.  Trial extubation is planned.    Anesthetic plan and risks discussed with patient.    I performed the pre-anesthetic exam and prescribed the anesthesia plan.

## 2020-12-24 ENCOUNTER — Ambulatory Visit: Admitting: Specialist

## 2020-12-24 ENCOUNTER — Encounter: Payer: Self-pay | Admitting: Orthopaedic Surgery

## 2020-12-24 ENCOUNTER — Encounter
Admission: RE | Disposition: A | Discharge status: Home / Self Care | Payer: Self-pay | Source: Ambulatory Visit | Attending: Orthopaedic Surgery

## 2020-12-24 ENCOUNTER — Ambulatory Visit
Admission: RE | Admit: 2020-12-24 | Discharge: 2020-12-24 | Disposition: A | Discharge status: Home / Self Care | Source: Ambulatory Visit | Attending: Orthopaedic Surgery | Admitting: Orthopaedic Surgery

## 2020-12-24 DIAGNOSIS — E119 Type 2 diabetes mellitus without complications: Secondary | ICD-10-CM | POA: Insufficient documentation

## 2020-12-24 DIAGNOSIS — S83241A Other tear of medial meniscus, current injury, right knee, initial encounter: Secondary | ICD-10-CM

## 2020-12-24 DIAGNOSIS — J45909 Unspecified asthma, uncomplicated: Secondary | ICD-10-CM | POA: Insufficient documentation

## 2020-12-24 DIAGNOSIS — Z7984 Long term (current) use of oral hypoglycemic drugs: Secondary | ICD-10-CM | POA: Insufficient documentation

## 2020-12-24 DIAGNOSIS — S83281A Other tear of lateral meniscus, current injury, right knee, initial encounter: Secondary | ICD-10-CM

## 2020-12-24 DIAGNOSIS — S83281D Other tear of lateral meniscus, current injury, right knee, subsequent encounter: Secondary | ICD-10-CM | POA: Insufficient documentation

## 2020-12-24 HISTORY — DX: Nausea with vomiting, unspecified: R11.2

## 2020-12-24 HISTORY — PX: PR ARTHRS KNEE SURG DEBRIDEMENT/SHVG ARTCLR CRTLG: 29877

## 2020-12-24 HISTORY — PX: PR ARTHRS KNEE SURG MENISCECTOMY MED&LAT W/SHAVING: 29880

## 2020-12-24 LAB — POC GLUCOSE (MMC): POC Glucose (MMC): 138 mg/dL — AB (ref 70–100)

## 2020-12-24 SURGERY — MENISCECTOMY, KNEE, PARTIAL
Anesthesia: General | Site: Knee | Laterality: Right | Wound class: Clean

## 2020-12-24 MED ORDER — ROPIVACAINE (PF) 5 MG/ML (0.5 %) INJECTION SOLUTION
INTRAMUSCULAR | Status: AC
Start: 2020-12-24 — End: 2020-12-24
  Filled 2020-12-24: qty 30

## 2020-12-24 MED ORDER — INTRAOP PROPOFOL INJ 20 ML VIAL (BOLUS+INFUSION)
Status: DC | PRN
Start: 2020-12-24 — End: 2020-12-24
  Administered 2020-12-24: 200 mg via INTRAVENOUS

## 2020-12-24 MED ORDER — LACTATED RINGERS IV INFUSION
INTRAVENOUS | Status: DC
Start: 2020-12-24 — End: 2020-12-24

## 2020-12-24 MED ORDER — TRAMADOL 50 MG TABLET
50.0000 mg | ORAL_TABLET | Freq: Four times a day (QID) | ORAL | 0 refills | Status: AC | PRN
Start: 2020-12-24 — End: 2020-12-31

## 2020-12-24 MED ORDER — SCOPOLAMINE 1 MG OVER 3 DAYS TRANSDERMAL PATCH
MEDICATED_PATCH | TRANSDERMAL | Status: AC
Start: 2020-12-24 — End: 2020-12-24
  Filled 2020-12-24: qty 1

## 2020-12-24 MED ORDER — TRAMADOL 50 MG TABLET
50.0000 mg | ORAL_TABLET | Freq: Once | ORAL | Status: AC
Start: 2020-12-24 — End: 2020-12-24
  Administered 2020-12-24: 50 mg via ORAL
  Filled 2020-12-24: qty 1

## 2020-12-24 MED ORDER — FENTANYL (PF) 50 MCG/ML INJECTION SOLUTION
INTRAMUSCULAR | Status: AC
Start: 2020-12-24 — End: 2020-12-24
  Filled 2020-12-24: qty 2

## 2020-12-24 MED ORDER — ONDANSETRON HCL (PF) 4 MG/2 ML INJECTION SOLUTION
4.0000 mg | INTRAMUSCULAR | Status: DC | PRN
Start: 2020-12-24 — End: 2020-12-24

## 2020-12-24 MED ORDER — CEFAZOLIN 1 GRAM SOLUTION FOR INJECTION
INTRAMUSCULAR | Status: DC | PRN
Start: 2020-12-24 — End: 2020-12-24
  Administered 2020-12-24: 2000 mg via INTRAVENOUS

## 2020-12-24 MED ORDER — INTRAOP FENTANYL 50 MCG/ML INJ 2 ML AMP
Status: DC | PRN
Start: 2020-12-24 — End: 2020-12-24
  Administered 2020-12-24 (×2): 50 ug via INTRAVENOUS

## 2020-12-24 MED ORDER — ROCURONIUM 50 MG/5 ML (10 MG/ML) INTRAVENOUS SYRINGE
INJECTION | INTRAVENOUS | Status: DC | PRN
Start: 2020-12-24 — End: 2020-12-24
  Administered 2020-12-24: 40 mg via INTRAVENOUS

## 2020-12-24 MED ORDER — SODIUM CHLORIDE 0.9 % FOR NEBULIZATION
INHALATION_SOLUTION | RESPIRATORY_TRACT | Status: AC
Start: 2020-12-24 — End: 2020-12-24
  Filled 2020-12-24: qty 3

## 2020-12-24 MED ORDER — ONDANSETRON HCL (PF) 4 MG/2 ML INJECTION SOLUTION
INTRAMUSCULAR | Status: DC | PRN
Start: 2020-12-24 — End: 2020-12-24
  Administered 2020-12-24: 4 mg via INTRAVENOUS

## 2020-12-24 MED ORDER — INTRAOP MIDAZOLAM (PF) 1 MG/ML INJ 2 ML VIAL
INTRAMUSCULAR | Status: DC | PRN
Start: 2020-12-24 — End: 2020-12-24
  Administered 2020-12-24: 2 mg via INTRAVENOUS

## 2020-12-24 MED ORDER — MIDAZOLAM 1 MG/ML INJECTION SOLUTION
INTRAMUSCULAR | Status: AC
Start: 2020-12-24 — End: 2020-12-24
  Filled 2020-12-24: qty 2

## 2020-12-24 MED ORDER — LIDOCAINE HCL 10 MG/ML (1 %) INJECTION SOLUTION
INTRAMUSCULAR | Status: DC | PRN
Start: 2020-12-24 — End: 2020-12-24
  Administered 2020-12-24: 50 mg via INTRAVENOUS

## 2020-12-24 MED ORDER — INTRAOP NACL 0.9% 3000 ML IRRIGATION BAG
Status: DC | PRN
Start: 2020-12-24 — End: 2020-12-24
  Administered 2020-12-24 (×2): 3000 mL

## 2020-12-24 MED ORDER — HALOPERIDOL LACTATE 5 MG/ML INJECTION SOLUTION
0.5000 mg | INTRAMUSCULAR | Status: DC | PRN
Start: 2020-12-24 — End: 2020-12-24

## 2020-12-24 MED ORDER — SUGAMMADEX 100 MG/ML INTRAVENOUS SOLUTION
INTRAVENOUS | Status: DC | PRN
Start: 2020-12-24 — End: 2020-12-24
  Administered 2020-12-24: 200 mg via INTRAVENOUS

## 2020-12-24 MED ORDER — ROPIVACAINE (PF) 5 MG/ML (0.5 %) INJECTION SOLUTION
INTRAMUSCULAR | Status: DC | PRN
Start: 2020-12-24 — End: 2020-12-24
  Administered 2020-12-24: 20 mL

## 2020-12-24 MED ORDER — MORPHINE 4 MG/ML INTRAVENOUS SYRINGE
4.0000 mg | INJECTION | INTRAVENOUS | Status: DC | PRN
Start: 2020-12-24 — End: 2020-12-24

## 2020-12-24 MED ORDER — FENTANYL (PF) 50 MCG/ML INJECTION SOLUTION
25.0000 ug | INTRAMUSCULAR | Status: DC | PRN
Start: 2020-12-24 — End: 2020-12-24
  Administered 2020-12-24 (×3): 25 ug via INTRAVENOUS
  Filled 2020-12-24: qty 2

## 2020-12-24 MED ORDER — HYDROMORPHONE 1 MG/ML INJECTION SYRINGE
0.5000 mg | INJECTION | INTRAMUSCULAR | Status: DC | PRN
Start: 2020-12-24 — End: 2020-12-24
  Administered 2020-12-24: 0.5 mg via INTRAVENOUS
  Filled 2020-12-24: qty 1

## 2020-12-24 MED ORDER — ALBUTEROL SULFATE CONCENTRATE 2.5 MG/0.5 ML SOLUTION FOR NEBULIZATION: 2.5000 mg | INHALATION_SOLUTION | RESPIRATORY_TRACT | Status: DC | PRN

## 2020-12-24 MED ORDER — LIDOCAINE HCL 10 MG/ML (1 %) INJECTION SOLUTION
0.1000 mL | INTRAMUSCULAR | Status: DC | PRN
Start: 2020-12-24 — End: 2020-12-24

## 2020-12-24 MED ORDER — INTRAOP PROPOFOL INJ 100 ML VIAL (BOLUS+INFUSION)
Status: DC | PRN
Start: 2020-12-24 — End: 2020-12-24
  Administered 2020-12-24: 100 ug/kg/min via INTRAVENOUS

## 2020-12-24 MED ORDER — PANTOPRAZOLE 40 MG INTRAVENOUS SOLUTION
40.0000 mg | Freq: Once | INTRAVENOUS | Status: AC
Start: 2020-12-24 — End: 2020-12-24
  Administered 2020-12-24: 40 mg via INTRAVENOUS
  Filled 2020-12-24: qty 40

## 2020-12-24 MED ORDER — INTRAOP DEXAMETHASONE 10 MG/ML INJ 1 ML VIAL
INTRAMUSCULAR | Status: DC | PRN
Start: 2020-12-24 — End: 2020-12-24
  Administered 2020-12-24: 4 mg via INTRAVENOUS

## 2020-12-24 MED ORDER — ALBUTEROL SULFATE CONCENTRATE 2.5 MG/0.5 ML SOLUTION FOR NEBULIZATION
2.5000 mg | INHALATION_SOLUTION | Freq: Once | RESPIRATORY_TRACT | Status: AC
  Administered 2020-12-24: 2.5 mg via RESPIRATORY_TRACT
  Filled 2020-12-24: qty 0.5

## 2020-12-24 MED ORDER — SCOPOLAMINE 1 MG OVER 3 DAYS TRANSDERMAL PATCH
1.0000 | MEDICATED_PATCH | Freq: Once | TRANSDERMAL | Status: DC
Start: 2020-12-24 — End: 2020-12-24
  Administered 2020-12-24: 1 via TRANSDERMAL

## 2020-12-24 MED ORDER — CHLORHEXIDINE GLUCONATE 0.12 % MOUTHWASH
15.0000 mL | MOUTHWASH | Freq: Once | Status: AC
Start: 2020-12-24 — End: 2020-12-24
  Administered 2020-12-24: 15 mL via ORAL
  Filled 2020-12-24: qty 15

## 2020-12-24 SURGICAL SUPPLY — 16 items
APPLICATOR 26 ML CHLORAPREP ORANG STERL (Prep) ×2
BANDAGE 6" ELASTIC VELCRO LATEX (Dressing) ×1
BLADE 4.0 MM AGGR PLUS HIP 375-544-100 (Knife)
BLADE 4.0 RESECTOR CUTTER 375-542-000 (Other) ×1
CLOSURE 1/2 X 4 STERI STRIP SKIN (Closure Device) ×1
COVER LHC1002 LIGHTHANDLE STERILE (Other) ×2
DRAPE 29414 LOWER EXTREMITY (Drape) ×1
DRESSING 433301 1 X 8 XEROFORM STERILE (Dressing) ×1
DRESSING ABD STERILE 8X10 (Dressing) ×1
GLOVE 8.5 PROTEXIS STERILE P/F (Glove) ×3
MARKER CL24120 SKIN W/RULR (Other) ×1
PACK KNEE ARTHROSCOPY (Pack) ×2
PADDING 2502 4" X 12' STERILE UNDERCAST (Dressing) ×1
SLEEVE MED VP501MG CALF  SCD COMPRESS (Other) ×1
SUTURE MONOSOF 4-0 C-13 SN-662 (Suture) ×1
TUBING STRYKER PUMP (Tubing) ×1

## 2020-12-24 NOTE — Nurse Discharge Note (Signed)
Pt home stable with daughter

## 2020-12-24 NOTE — H&P (Signed)
Pre-op H&P and consent verified.  No change in H&P since patient last seen. Questions answered. Okay to proceed with procedure as booked.    W. Taylor Barrie Wale,M.D.  12/24/2020

## 2020-12-24 NOTE — Nurse Assessment (Signed)
PACU ADMIT NURSING NOTE    Note Started: 12/24/2020, 14:47     Received patient from OR at 1438 hours via gurney.  Monitor and Alarms on. Pt accompanied by Dr. Odis Hollingshead and RN Cordelia Pen, report received. No sign of distress, VSS. Patient sleepy but arousable. C/o of pain but denies nausea. Barbette Hair, RN

## 2020-12-24 NOTE — Anesthesia Postprocedure Evaluation (Signed)
Patient: Evlyn Clines    MENISCECTOMY, KNEE, PARTIAL    Anesthesia Type: general    Stop Bang:      Vital Signs (Last Recorded):  BP: 113/73 (12/24/20 1442)  Pulse: 76 (12/24/20 1442)  Resp: 16 (12/24/20 1442)  Temp Max: 36.3 C (97.3 F)  (Last 24 hours)  Temp: 36.3 C (97.3 F) (12/24/20 1442)  SpO2: 100 % (12/24/20 1442) on           Anesthesia Post Evaluation    Procedure: Procedure(s):MENISCECTOMY, KNEE, PARTIAL  Location: [facility] OR  Anesthesia: General    Patient location during evaluation: PACU  Patient participation: complete - patient participated  Level of consciousness: sleepy but conscious  Pain management: satisfactory to patient    Patient did not have a perioperative block    Airway patency: patent  Anesthetic complications: no  Cardiovascular status: stable  Respiratory status: spontaneous ventilation  Hydration status: euvolemic  Nausea and Vomiting: absent          Pt is doing well, resting comfortably. Maintaining O2 sats via mask. VSS.    Pt continued to do well in PACU. Pain controlled, no nausea. VSS. Pt d/c to home in stable condition without any apparent anesthetic complications.    No complications documented.    Delman Cheadle, MD

## 2020-12-24 NOTE — Anesthesia Procedure Notes (Signed)
Airway    Indications and Patient Condition    Spontaneous Ventilation: absent  Sedation level: level 0: deep/analgesia  Preoxygenated: yes  Patient position: sniffing  MILS maintained throughout  Mask difficulty assessment: 1 - vent by mask    Final Airway Details    Final airway type: endotracheal airway      Successful airway: cuffed ETT    Successful intubation technique: video laryngoscopy  Facilitating devices/methods: intubating stylet  Endotracheal tube insertion site: oral  Blade: Macintosh  Blade size: #3  ETT size: 7.0 mm  Cormack-Lehane Classification: grade I - full view of glottis  Placement verified by: chest auscultation and capnometry   Measured from: lips  Secured at 22 cm.  Number of attempts at approach: 1    Additional Comments  Atraumatic intubation using mcgrath video laryngoscope x 1 attempt. Neck in neutral position. ETT easily placed through VC. Cuff inflated with less than 10ml of air. Lips and dentitions are intact. Soft gauze bite block placed to protect lips and tongue. VSS

## 2020-12-24 NOTE — Op Note (Signed)
[facility].    BRIEF OPERATIVE NOTE    Brenna Guzman55yrfemale      Service: Orthopaedic Surgery    Date of Service: 12/24/2020    Pre-Op Diagnosis:   Tear of lateral meniscus of right knee, current, unspecified tear type, subsequent encounter [S83.281D]  Tear of medial meniscus of right knee, current, unspecified tear type, subsequent encounter [S83.241D]    Post-Op Diagnosis:  Same    Procedure Performed:  Procedure(s) with comments:  MENISCECTOMY, KNEE, PARTIAL (Right) - Right knee arthroscopic partial and lateral menisectomy, chrondoplasty lateral femocondile (femoral condyle)    Name of Surgeon : Faylene Million    Assistants: None    Anesthesiologist: Odis Hollingshead    Type of Anesthesia:  General and local    DVT Prophylaxis:  SCD    Findings:  Same as post-op diagnosis.    Specimens Removed:  * No specimens in log *    EBL:  10 ml    Drains:  None    Fluids:  See anesthesia note    Complications: none    Outcome:  Satisfactory    Patient is a 55 year old female with chronic right knee pain.  MRI suggested lateral meniscus tear.  Having failed conservative treatment, the patient desired to proceed with operative arthroscopy.  Risk of bleeding, infection, nerve damage, incomplete relief of or recurrence of symptoms, DVT, pulmonary embolus, cardiopulmonary complications were discussed and she desired to proceed thank you    Patient was brought to the operating room and laid in a supine position.  Proper identification was performed and timeout performed confirming the right knee was a correct surgical site.  The patient received prophylactic antibiotics and was placed under a general anesthetic.  Right lower extremity was sterilely prepped and draped in a standard manner.  Exam of the right knee revealed no instability.  Standard anterolateral and anteromedial arthroscopic portals were established and the scope introduced laterally.  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  There were no loose chondral fragments amenable to resection.  The suprapatellar pouch and the gutters revealed no loose bodies.  The medial compartment is entered and there was fraying of the posterior horn of the medial meniscus with a horizontal cleavage tear.  A combination basket forceps and suction shaver were used to trim the posterior horn of the meniscus to smooth stable margins removing approximately 30% of the rim with.  The meniscus was probed and shown to be stable.  There were no under turn fragments beneath the meniscus.  The intercondylar notch showed the ACL and PCL to be intact without loose bodies.  The lateral compartment was entered and there was degenerative tearing of the lateral meniscus and a small undetermined fragment beneath the meniscus at the junction of the middle and posterior thirds.  This fragment was reduced and combination basket forceps and suction shaver were used to trim the torn meniscus to smooth stable margins.  30% of the rim width was removed posteriorly.  There were no further loose fragments.  Examination of the chondral surface of the lateral femoral condyle showed to be a flap tear of the chondral cartilage that was removed with a suction shaver.  There was one small central focus measuring about a millimeter with apparent subchondral bone but no significant other chondral loss.  The knee joint was copiously irrigated and drained and  instilled with about 10 cc of ropivacaine.  The portals were infiltrated with ropivacaine.  The portals were closed with nylon sutures.  Sterile dressings were placed and patient was delivered to the recovery room in stable condition.    Myna Hidalgo Nemiah Kissner,M.D..  12/24/2020

## 2020-12-24 NOTE — Nurse Transfer Note (Signed)
PACU Transfer Note, RN Accompanying Patient  Note started on 12/24/2020, 15:28    Patient transferred to outpatient unit via gurney with room air at 1520 hours. Bed down, locked, side rails up times 4, and call light in reach. Belongings with in property room. Report in EMR and RN Jillian notified of arrival. Assessment unchanged. Barbette Hair, RN

## 2020-12-24 NOTE — Discharge Instructions (Signed)
Discharge Instructions: Post Anesthesia  1. It is normal to feel dizzy and sleepy for many hours after your procedure/test.  Do not drive a car, work any equipment, sign important papers, or make big decisions until the next day.  2. Diet:  Begin with clear liquids (apple juice, clear broth, ginger ale) and slowly return to what you normally eat.  No alcoholic drinks for 24 hours.  3. Activities:  Rest the day of surgery.  The day after procedure/test, you may do whatever you feel able to do, unless otherwise directed by your doctor.    4. Temperature:  Report any FEVER over 100 degrees to your doctor.  Take your temperature once in the evening on the day of surgery, and once the next morning.      Scopolamine Patch: Discharge Instructions  You have a Scopolamine patch behind your  LEFT / RIGHT:[id number] ear, which helps to reduce nausea and vomiting. The patch can remain on for up to 72 hours and should be removed by  TIME NEEDED:20033. It can be removed before this date if it isn't needed or you have any of the following:   Agitation, nervousness or confusion   Blurred (fuzzy)vision or other eye problems   Hallucinations   Pain or difficulty passing urine   Skin rash or itching   Feeling that your heart is racing (palpitations)  Please fold removed patch in half and place in a sealed bag (ziplock bag) and throw away in trash. Once the patch is removed,  wash your hands thoroughly and wash the patch area behind your ear with soap and water. Your pupils may get big in size if the medication comes in contact with your eyes.   Any questions, please contact the surgical team.    PLEASE USE ONE NOZIN (NASAL SANITIZER SWAB) TO CLEANSE BOTH NOSTRILS. DO THIS TWICE DAILY FOR 4 DAYS STARTING TOMORROW. FOLLOW DIRECTIONS ON INSERT.\

## 2021-01-03 ENCOUNTER — Emergency Department

## 2021-01-03 ENCOUNTER — Emergency Department
Admission: AD | Admit: 2021-01-03 | Discharge: 2021-01-03 | Disposition: A | Discharge status: Home / Self Care | Attending: Emergency Medicine | Admitting: Emergency Medicine

## 2021-01-03 DIAGNOSIS — M25461 Effusion, right knee: Secondary | ICD-10-CM | POA: Insufficient documentation

## 2021-01-03 DIAGNOSIS — Z9889 Other specified postprocedural states: Secondary | ICD-10-CM | POA: Insufficient documentation

## 2021-01-03 LAB — CBC WITH DIFFERENTIAL
Basophils % Auto: 0.4 % (ref 0.0–1.0)
Basophils Abs Auto: 0 10*3/uL (ref 0.0–0.1)
Eosinophils % Auto: 3.4 % (ref 0.0–4.0)
Eosinophils Abs Auto: 0.3 10*3/uL — ABNORMAL HIGH (ref 0.0–0.2)
Hematocrit: 36.2 % (ref 36.0–48.0)
Hemoglobin: 13 g/dL (ref 12.0–16.0)
Immature Granulocytes % Auto: 0.4 % (ref 0.00–0.50)
Immature Granulocytes Abs Auto: 0 10*3/uL (ref 0.0–0.0)
Lymphocytes % Auto: 19.8 % (ref 5.0–41.0)
Lymphocytes Abs Auto: 1.7 10*3/uL (ref 1.3–2.9)
MCH: 31 pg (ref 27.0–34.0)
MCHC g/dL: 35.9 g/dL (ref 33.0–37.0)
MCV: 86.4 fL (ref 82.0–97.0)
MPV: 10 fL (ref 9.4–12.4)
Monocytes % Auto: 6.8 % (ref 0.0–10.0)
Monocytes Abs Auto: 0.6 10*3/uL (ref 0.3–0.8)
Neutrophils % Auto: 69.2 % (ref 45.0–75.0)
Neutrophils Abs Auto: 5.86 10*3/uL — ABNORMAL HIGH (ref 2.20–4.80)
Nucleated Cell Count: 0 10*3/uL (ref 0.0–0.1)
Nucleated RBC/100 WBC: 0 % WBC (ref <=0.0)
Platelet Count: 251 10*3/uL (ref 151–365)
RDW: 12.6 % (ref 11.5–14.5)
Red Blood Cell Count: 4.19 10*6/uL (ref 3.80–5.10)
White Blood Cell Count: 8.5 10*3/uL (ref 4.2–10.8)

## 2021-01-03 LAB — C-REACTIVE PROTEIN: C-Reactive Protein: 1.8 mg/dL — ABNORMAL HIGH (ref <=1.0)

## 2021-01-03 LAB — SED RATE WESTERGREN: Sed Rate Westergren: 39 mm/hr — ABNORMAL HIGH (ref 0–20)

## 2021-01-03 NOTE — ED Provider Notes (Signed)
Chief Complaint   Patient presents with    Knee Pain       HPI: Amanda Williamson is a 55 year old woman, post op day 10 from arthroscopic menisectomy by Dr. Durene Fruits who presents with knee pain.  Increased swelling and pain. Took tramadol earlier this morning, has not taken tylenol which as she normally does.  Pain worse with movement and ambulating.  Temp of 99 at home.  Started increasing activity yesterday when pain began.          Past Medical History:   Diagnosis Date    Diabetes ([place])     Erythema nodosum     H/O mammogram 09/10/2016    Kidney stones     PFS (patellofemoral syndrome)     Knee pain    PONV (postoperative nausea and vomiting)     RAD (reactive airway disease)        Past Surgical History:   Procedure Laterality Date    ------------OTHER------------- Right 02/2018    rotator cuff    COLONOSCOPY  07/08/2012    WNL    COLONOSCOPY  02/24/2015    ESOPHAGOGASTRODUODENOSCOPY (EGD)  02/24/2015    LIGATION, FALLOPIAN TUBE, BILATERAL, USING TUBAL RING  1994    PR ARTHRS KNEE W/MENISCECTOMY MED&LAT W/SHAVING Right 12/24/2020    PR KNEE SCOPE,SHAVE ARTICULAR CART Right 12/24/2020       Family History   Problem Relation Name Age of Onset    Arthritis Mother          Rheumitoid    Lipids Mother      Diabetes Father      Other (Other) Father          Kidney faliure    No Known Problems Sister      No Known Problems Brother      No Known Problems Brother      No Known Problems Brother      No Known Problems Daughter      No Known Problems Daughter      No Known Problems Daughter      Other (Other) Son          pulmonary    Diabetes Son          childhood       Social History     Socioeconomic History    Marital status: SINGLE     Spouse name: Not on file    Number of children: Not on file    Years of education: Not on file    Highest education level: Not on file   Occupational History    Not on file   Tobacco  Use    Smoking status: Never Smoker    Smokeless tobacco: Never Used   Substance and Sexual Activity    Alcohol use: No    Drug use: No    Sexual activity: Not on file   Other Topics Concern    Not on file   Social History Narrative    Not on file     Social Determinants of Health     Financial Resource Strain: Not on file   Food Insecurity: Not on file   Transportation Needs: Not on file   Physical Activity: Not on file   Stress: Not on file   Social Connections: Not on file   Intimate Partner Violence: Not on file   Housing Stability: Not on file       Allergies: No Known Allergies  Medication: No current facility-administered medications for this encounter.    Current Outpatient Medications:     Acetaminophen (TYLENOL) 500 mg Tablet, Take 500 mg by mouth every 6 hours if needed for pain or Fever., Disp: , Rfl:     Azelastine Nasal (ASTELIN) 137 mcg (0.1 %) Spray, INSTILL 1 SPRAY INTO EACH NOSTRIL 2 TIMES DAILY., Disp: 30 mL, Rfl: 0    Ferrous Sulfate 325 mg (65 mg iron) DR Tablet, Take 325 mg by mouth every morning., Disp: , Rfl:     Fluticasone (FLONASE) 50 mcg/actuation nasal spray, 1 spray each nostril twice daily (Patient taking differently: Instill 1 spray into EACH nostril two times daily if needed (allergies). 1 spray each nostril twice daily), Disp: 16 g, Rfl: 3    L.acid/L.casei/B.bif/B.lon/FOS (PROBIOTIC BLEND PO), Take 1 capsule by mouth every morning., Disp: , Rfl:     Lisinopril (PRINIVIL, ZESTRIL) 2.5 mg Tablet, TAKE 1 TABLET BY MOUTH EVERY DAY (Patient taking differently: Take 2.5 mg by mouth every day at bedtime.), Disp: 90 tablet, Rfl: 1    MAGNESIUM PO, Take 1 tablet by mouth every morning., Disp: , Rfl:     Metformin (GLUCOPHAGE) 1,000 mg tablet, TAKE 1 TABLET BY MOUTH TWICE A DAY WITH MEALS (Patient taking differently: Take 1,000 mg by mouth 2 times daily with meals.), Disp: 180 tablet, Rfl: 1    MULTIVITAMIN PO, Take 1 tablet by mouth every morning. Twice a day, Disp: , Rfl:      Psyllium Husk 0.52 gram Capsule, Take 1 capsule by mouth every morning., Disp: , Rfl:     Simvastatin (ZOCOR) 10 mg Tablet, TAKE 1 TABLET BY MOUTH EVERY DAY (Patient taking differently: Take 10 mg by mouth every evening.), Disp: 90 tablet, Rfl: 1    TURMERIC PO, Take 1 capsule by mouth every day at bedtime., Disp: , Rfl:     ROS:  Review of Systems   Constitutional: Positive for fever.   Musculoskeletal:        Knee pain and swelling     All other systems reviewed and are negative.      PE:  Physical Exam  Vitals reviewed.   Constitutional:       General: She is not in acute distress.     Appearance: Normal appearance. She is not ill-appearing.   HENT:      Head: Normocephalic and atraumatic.      Nose: Nose normal.   Eyes:      Extraocular Movements: Extraocular movements intact.   Cardiovascular:      Rate and Rhythm: Normal rate.      Pulses: Normal pulses.   Pulmonary:      Effort: Pulmonary effort is normal. No respiratory distress.   Abdominal:      General: There is no distension.   Musculoskeletal:      Cervical back: Normal range of motion and neck supple.      Comments: Right knee diffuse swelling, decreased ROM due to pain, incision well approximated with sutures,no erythema.     Skin:     General: Skin is warm and dry.      Capillary Refill: Capillary refill takes less than 2 seconds.   Neurological:      General: No focal deficit present.      Mental Status: She is alert and oriented to person, place, and time.   Psychiatric:         Mood and Affect: Mood normal.  Behavior: Behavior normal.         Temp: 37.4 C (99.4 F) (04/02 1459)  Temp src: Oral (04/02 1459)  Pulse: 77 (04/02 1700)  BP: 126/87 (04/02 1700)  Resp: 16 (04/02 1700)  SpO2: 99 % (04/02 1700)  Height: --  Weight: 82.1 kg (181 lb) (04/02  1459)    Differentials: effusion, strain, septic joint, DVT    Labs:   Results for orders placed or performed during the hospital encounter of 01/03/21   CBC with Differential     Status: Abnormal   Result Value Status    White Blood Cell Count 8.5 Final    Red Blood Cell Count 4.19 Final    Hemoglobin 13.0 Final    Hematocrit 36.2 Final    MCV 86.4 Final    MCH 31.0 Final    MCHC g/dL 35.9 Final    RDW 12.6 Final    MPV 10.0 Final    Platelet Count 251 Final    Neutrophils % Auto 69.2 Final    Lymphocytes % Auto 19.8 Final    Monocytes % Auto 6.8 Final    Eosinophils % Auto 3.4 Final    Basophils % Auto 0.4 Final    Immature Granulocytes % Auto 0.40 Final    Neutrophils Abs Auto 5.86 (H) Final    Lymphocytes Abs Auto 1.7 Final    Monocytes Abs Auto 0.6 Final    Eosinophils Abs Auto 0.3 (H) Final    Basophils Abs Auto 0.0 Final    Nucleated RBC/100 WBC 0.0 Final    Nucleated Cell Count 0.0 Final    Immature Granulocytes Abs Auto 0.0 Final   Sed Rate Westergren     Status: Abnormal   Result Value Status    Sed Rate Westergren 39 (H) Final   C-Reactive Protein     Status: Abnormal   Result Value Status    C-Reactive Protein 1.8 (H) Final       Radiology:[facility] KNEE 3 VIEWS RIGHT    Result Date: 01/03/2021  EXAMINATION: KNEE INDICATION: Increasing pain and swelling post surgery COMPARISON: September 15, 2020 FINDINGS: 3 views of the right knee reveal normal bone alignment and mineralization. There is no visible fracture or dislocation. There is a small knee joint effusion. There are tricompartmental osteophytes with spurring of the tibial spines. The soft tissues are unremarkable.     IMPRESSION: 1. Small knee joint effusion without evidence of acute bone abnormality 2. Tricompartmental osteophytes     [facility] US VENOUS DUPLEX, RIGHT LOWER EXTREMITY    Result Date: 01/03/2021  RIGHT LOWER EXTREMITY VENOUS DUPLEX EXAM: COMPARISON: None INDICATION: Ankle  swelling FINDINGS: Color-flow duplex, spectral analysis and grayscale scanning of the deep venous system of the right lower extremity was performed. All deep veins show normal compressibility, echogenicity and Doppler waveforms. There is no evidence of intraluminal filling defect. The superficial veins are unremarkable.     IMPRESSION: 1. No evidence of deep venous thrombosis in the right lower extremity.       Orders Placed This Encounter    [place] KNEE 3 VIEWS RIGHT    [place] US VENOUS DUPLEX, RIGHT LOWER EXTREMITY    CBC with Differential    Sed Rate Westergren    C-Reactive Protein    Acetaminophen (TYLENOL) 500 mg Tablet    Psyllium Husk 0.52 gram Capsule    Ferrous Sulfate 325 mg (65 mg iron) DR Tablet       Coding    ED Summary: 55  year old woman with right knee pain and swelling.  10 days post op from arthroscopy menisectomy.  Increased activity over the last few days.  Yesterday had increased swelling and difficulty ambulating.  Moderate swelling, XR showing effusion.  No bony tenderness.  Also complains of diffuse upper and lower leg pain.  [place] negative for DVT.  No leukolysis, mild elevation of CRP and ESR.  No apparent infection appreciated.  Ace wrap and crutches provided.  instructed to take NSAIDs and follow up with Dr. Durene Fruits.         Clinical Impression:     ICD-10-CM    1. Effusion of right knee  M25.461        PLAN: discharge. The patient participated in the decision making process, verbalized understanding, and no barriers to learning identified.    The following medications were prescribed: none    Disposition: Discharge. Follow up with orhtopedics. ED discharge instructions were reviewed and provided.                APP DOCUMENTATION:  Dr. Cherly Hensen was consulted on this case, but did not independently evaluate the patient.    PATIENT'S GENERAL CONDITION:  Good: Vital signs are stable and within normal limits. Patient is conscious and comfortable. Indicators are excellent.     Though I suspect no  acutely life threatening condition, I did recommend patient return for any new or worse problems. Pt was advised to follow up with PCP for further evaluation as soon as reasonable. Patient is aware their evaluation was not complete and primarily acute issues were evaluated and there is still a possibility of chronic issues requiring further evaluation.       This chart was electronically signed by Edgar Frisk, ACNP

## 2021-01-03 NOTE — ED Nursing Note (Signed)
Discharge Note      Crutches provided per PA Alioto. Written and verbal education provided; the patient verbalized understanding.  All questions and concerns addressed. Patient discharged to Home. All belongings sent with patient at time of discharge.  Patient leaves the ED ambulatory with Family.    Patient's pain/comfort level assessed during their ED course and interventions for increased comfort were implemented as needed.    Non-pharmacological interventions: Cold applied.    Interventions were effective.

## 2021-01-03 NOTE — ED Triage Note (Signed)
Had right knee surgery here on 3/23 with Dr. Faylene Million.  Here for increased pain and swelling to right knee.

## 2021-01-03 NOTE — Discharge Instructions (Signed)
You make take Tylenol 500 mg every 6 hours and Ibuprofen 600 mg every 6 hours as needed for pain control.      Ace wrap for comfort. Ice and elevate knee.      Return for any new or worsening conditions.

## 2021-01-03 NOTE — ED Nursing Note (Signed)
Crutches provided; height adjusted, walking instructions given with return demonstration completed.  Pt verbalized understanding of all instructions given.   Pt ambulated with crutches to bathroom.

## 2021-01-06 ENCOUNTER — Ambulatory Visit (HOSPITAL_BASED_OUTPATIENT_CLINIC_OR_DEPARTMENT_OTHER): Admitting: Orthopaedic Surgery

## 2021-01-06 ENCOUNTER — Encounter (HOSPITAL_BASED_OUTPATIENT_CLINIC_OR_DEPARTMENT_OTHER): Payer: Self-pay | Admitting: Orthopaedic Surgery

## 2021-01-06 ENCOUNTER — Other Ambulatory Visit (HOSPITAL_BASED_OUTPATIENT_CLINIC_OR_DEPARTMENT_OTHER): Payer: Self-pay | Admitting: Family Medicine

## 2021-01-06 VITALS — Temp 98.2°F | Ht 64.0 in | Wt 181.0 lb

## 2021-01-06 DIAGNOSIS — S83241D Other tear of medial meniscus, current injury, right knee, subsequent encounter: Secondary | ICD-10-CM

## 2021-01-06 DIAGNOSIS — J019 Acute sinusitis, unspecified: Secondary | ICD-10-CM

## 2021-01-06 DIAGNOSIS — S83281D Other tear of lateral meniscus, current injury, right knee, subsequent encounter: Secondary | ICD-10-CM

## 2021-01-06 MED ORDER — MELOXICAM 15 MG TABLET
15.0000 mg | ORAL_TABLET | Freq: Every day | ORAL | 0 refills | Status: DC
Start: 2021-01-06 — End: 2021-02-02

## 2021-01-06 NOTE — Telephone Encounter (Signed)
Recent Visits  Date Type Provider Dept   10/29/20 Office Visit Audrea Muscat, [place] [facility]   09/19/20 Office Visit Jose Persia, Kirt Boys, [facility]   07/23/20 Telephone Davonna Belling, Elmarie Mainland, [place] [facility]   07/23/20 Telephone Davonna Belling, Elmarie Mainland, [place] [facility]   07/22/20 Office Visit Jose Persia, Kirt Boys, NP [facility]   07/21/20 Telephone Davonna Belling, Elmarie Mainland, [place] [facility]   07/16/20 Office Visit Audrea Muscat, [place] [facility]   05/29/20 Office Visit Audrea Muscat, [place] [facility]   04/15/20 Office Visit Audrea Muscat, [place] [facility]   03/13/20 Office Visit Lewkowitz, Elmarie Mainland, PA [facility]   Showing recent visits within past 365 days with a meds authorizing provider and meeting all other requirements  Future Appointments  Date Type Provider Dept   02/26/21 Appointment Davonna Belling, Elmarie Mainland, [facility]   Showing future appointments within next 180 days with a meds authorizing provider and meeting all other requirements

## 2021-01-06 NOTE — Progress Notes (Signed)
[facility] Clinic Note    PATIENT: Amanda Williamson  MR #: [id number]  DOB: 08-Sep-1966  SEX: female AGE: 28yr  SERVICE DATE: 01/06/2021    Reason for visit:   Chief Complaint   Patient presents with    Post-op     Right Knee Arthroscopy DOS 12/24/2020       Subjective:  Amanda Williamson is a 3yr female presents for right knee arthroscopy 2 weeks ago. Has had pain after surgery and has had swelling. Went to ER due to symptoms. laboratory and exam not suggesting infection. Took tramadol and ibuprofen. Developed chin and lip numbness/tingling. ER evaluated and discharged home. No sob or calf pain.     Objective:  Temp 36.8 C (98.2 F) (Temporal)   Ht 1.626 m (5\' 4" )   Wt 82.1 kg (181 lb)   BMI 31.07 kg/m     PE:  Gen: NAD, comfortable    Physical Exam  Ortho Exam    right lower extremity:  Exam: knee. Moderate effusion. NVI. No calf pain. Well healed incision. No erythema  Vascular: palpable dorsalis pedis pulse, toes warm and well perfused, capillary refill < 2s  Motor function: intact DF/PF/EHL  ROM: 5-90  Sensory: S/S/SP/DP/T intact to light touch    Assesment and Plan:  Amanda Williamson is a 67yr female with right knee arthroscopy. Limit activity, ankle ROM. meloxicam , and PT. Follow up 4 weeks.  53yr Amanda Williamson,M.D.  01/06/2021

## 2021-01-08 ENCOUNTER — Ambulatory Visit (HOSPITAL_BASED_OUTPATIENT_CLINIC_OR_DEPARTMENT_OTHER): Admitting: NURSE PRACTITIONER

## 2021-01-08 VITALS — BP 120/80 | HR 94 | Temp 98.2°F | Resp 16

## 2021-01-08 DIAGNOSIS — J029 Acute pharyngitis, unspecified: Secondary | ICD-10-CM

## 2021-01-08 DIAGNOSIS — Z23 Encounter for immunization: Secondary | ICD-10-CM

## 2021-01-08 MED ORDER — AZITHROMYCIN 250 MG TABLET
ORAL_TABLET | ORAL | 0 refills | Status: AC
Start: 2021-01-08 — End: 2021-01-13

## 2021-01-08 NOTE — Progress Notes (Signed)
Amanda Williamson, Amanda Williamson   MRN: [id number]  DOB: 1965-11-12          Visit Date: January 08, 2021     [facility], [facility]      Phone: [phone], Fax: [phone]           Chief Complaint: Cough      History of Present Illness:   45yr Female who presents to the clinic for evaluation of ST, ear fullness, sinus congestion and dry cough  She has tried Zyrtec and Astelin NS which she has not found to be helpful   She reports tactile fever and fatigue   Rapid strep test taken in the office today is negative      PMH: history of bronchitis/RAD and seasonal allergies     History additionally notable for dyslipidemia, DM2, HTN. HLD. IDA   Lab Results   Component Value Date    HGBA1C 7.0 (H) 10/28/2020    HGBA1C 6.9 (H) 07/10/2020    HGBA1C 7.0 (H) 03/14/2020     Healthcare maintenance: Patient is in need of a Tdap update which will be provided today       Past Medical History:   Patient Active Problem List   Diagnosis    RAD (reactive airway disease)    Erythema nodosum    PFS (patellofemoral syndrome)    Diabetes mellitus type 2 in obese (HCC)    HTN (hypertension)    Dyslipidemia    Colon polyps    Breast cancer screening    Adhesive capsulitis of right shoulder    Seasonal allergies    Impingement syndrome of right shoulder    History of arthroscopic surgery of shoulder    Osteoarthritis of right knee, unspecified osteoarthritis type    Effusion of right knee       Past Surgical History:   Past Surgical History:   Procedure Laterality Date    ------------OTHER------------- Right 02/2018    rotator cuff    COLONOSCOPY  07/08/2012    WNL    COLONOSCOPY  02/24/2015    ESOPHAGOGASTRODUODENOSCOPY (EGD)  02/24/2015    LIGATION, FALLOPIAN TUBE, BILATERAL, USING TUBAL RING  1994    PR ARTHRS KNEE W/MENISCECTOMY MED&LAT W/SHAVING Right 12/24/2020    PR KNEE SCOPE,SHAVE ARTICULAR CART Right 12/24/2020        Review of Systems:   Review of Systems  14 point review of systems is  otherwise negative . Pertinent findings in HPI      Family History:   family history includes Arthritis in her mother; Diabetes in her father and son; Lipids in her mother; No Known Problems in her brother, brother, brother, daughter, daughter, daughter, and sister; Other in her father and son.    Social History:    reports that she has never smoked. She has never used smokeless tobacco. She reports that she does not drink alcohol and does not use drugs.    Allergies:  No Known Allergies    Medications:    Current Outpatient Medications:     Acetaminophen (TYLENOL) 500 mg Tablet, Take 500 mg by mouth every 6 hours if needed for pain or Fever., Disp: , Rfl:     Azelastine Nasal (ASTELIN) 137 mcg (0.1 %) Spray, INSTILL 1 SPRAY INTO EACH NOSTRIL 2 TIMES DAILY., Disp: 30 mL, Rfl: 2    Ferrous Sulfate 325 mg (65 mg iron) DR Tablet, Take 325 mg by mouth every morning., Disp: , Rfl:     Fluticasone (  FLONASE) 50 mcg/actuation nasal spray, 1 spray each nostril twice daily (Patient taking differently: Instill 1 spray into EACH nostril two times daily if needed (allergies). 1 spray each nostril twice daily), Disp: 16 g, Rfl: 3    L.acid/L.casei/B.bif/B.lon/FOS (PROBIOTIC BLEND PO), Take 1 capsule by mouth every morning., Disp: , Rfl:     Lisinopril (PRINIVIL, ZESTRIL) 2.5 mg Tablet, TAKE 1 TABLET BY MOUTH EVERY DAY (Patient taking differently: Take 2.5 mg by mouth every day at bedtime.), Disp: 90 tablet, Rfl: 1    MAGNESIUM PO, Take 1 tablet by mouth every morning., Disp: , Rfl:     Meloxicam (MOBIC) 15 mg tablet, Take 1 tablet by mouth once daily after a meal., Disp: 30 tablet, Rfl: 0    Metformin (GLUCOPHAGE) 1,000 mg tablet, TAKE 1 TABLET BY MOUTH TWICE A DAY WITH MEALS (Patient taking differently: Take 1,000 mg by mouth 2 times daily with meals.), Disp: 180 tablet, Rfl: 1    MULTIVITAMIN PO, Take 1 tablet by mouth every morning. Twice a day, Disp: , Rfl:     Psyllium Husk 0.52 gram Capsule, Take 1 capsule by  mouth every morning., Disp: , Rfl:     Simvastatin (ZOCOR) 10 mg Tablet, TAKE 1 TABLET BY MOUTH EVERY DAY (Patient taking differently: Take 10 mg by mouth every evening.), Disp: 90 tablet, Rfl: 1    TURMERIC PO, Take 1 capsule by mouth every day at bedtime., Disp: , Rfl:     Immunization:  Immunization History   Administered Date(s) Administered    COVID-19 mRNA PF (Moderna) 01/19/2020, 02/15/2020    COVID-19 mRNA PF 30 mcg/0.3 mL (Pfizer) (PURPLE CAP) 09/03/2020    Influenza Vaccine, Quadrivalent (Fluarix/Flulaval/Fluzone Pref[facility] Syringe [facility]) 08/11/2018    Influenza Vaccine, Quadrivalent (Fluarix/Flulaval/Fluzone Prefill Syringe) 07/16/2020    Influenza, Seasonal, Injectable, Preservative Free 09/02/2013       Health Maintenance:  Health Maintenance   Topic Date Due    DIABETES-RETINOPATHY SCREENING  Never done    Pneumococcal Vaccine 0-64 (1 of 2 - PPSV23) Never done    DTAP/TDAP/TD (1 - Tdap) Never done    Shingrix (Zoster) (1 of 2) Never done    DIABETES-NEUROPATHY SCREENING  12/10/2017    HEMOGLOBIN A1C  04/27/2021    DIABETES-NEPHROPATHY SCREENING  09/19/2021    Cervical Cancer Screening  03/04/2022    MAMMOGRAM  12/04/2022    Colorectal Cancer Screening  02/23/2025    Covid-19 Vaccine  Completed    INFLUENZA  Completed    HIV SCREENING  Discontinued       Physical Examination:   BP 120/80 (SITE: left arm, Orthostatic Position: sitting, Cuff Size: regular)   Pulse 94   Temp 36.8 C (98.2 F) (Temporal)   Resp 16   SpO2 99% , There is no height or weight on file to calculate BMI.     Physical Exam  Constitutional:       Appearance: She is well-developed.      Comments: Pleasant 55 year old female in no acute distress   HENT:      Head: Normocephalic and atraumatic.      Mouth/Throat:      Comments: Posterior pharynx somewhat inflamed without exudative material  Cardiovascular:      Rate and Rhythm: Normal rate and regular rhythm.      Heart sounds: Normal heart sounds.    Pulmonary:      Effort: Pulmonary effort is normal.      Breath sounds: Normal breath sounds.  Musculoskeletal:      Cervical back: Normal range of motion.   Lymphadenopathy:      Cervical: No cervical adenopathy.   Skin:     General: Skin is warm and dry.   Psychiatric:         Behavior: Behavior normal.         Thought Content: Thought content normal.           [facility] KNEE 3 VIEWS RIGHT    Result Date: 01/03/2021  EXAMINATION: KNEE INDICATION: Increasing pain and swelling post surgery COMPARISON: September 15, 2020 FINDINGS: 3 views of the right knee reveal normal bone alignment and mineralization. There is no visible fracture or dislocation. There is a small knee joint effusion. There are tricompartmental osteophytes with spurring of the tibial spines. The soft tissues are unremarkable.     IMPRESSION: 1. Small knee joint effusion without evidence of acute bone abnormality 2. Tricompartmental osteophytes     [facility] US VENOUS DUPLEX, RIGHT LOWER EXTREMITY    Result Date: 01/03/2021  RIGHT LOWER EXTREMITY VENOUS DUPLEX EXAM: COMPARISON: None INDICATION: Ankle swelling FINDINGS: Color-flow duplex, spectral analysis and grayscale scanning of the deep venous system of the right lower extremity was performed. All deep veins show normal compressibility, echogenicity and Doppler waveforms. There is no evidence of intraluminal filling defect. The superficial veins are unremarkable.     IMPRESSION: 1. No evidence of deep venous thrombosis in the right lower extremity.     LAB TESTS/STUDIES:   Admission on 01/03/2021, Discharged on 01/03/2021   Component Date Value Ref Range Status    White Blood Cell Count 01/03/2021 8.5  4.2 - 10.8 K/MM3 Final    Red Blood Cell Count 01/03/2021 4.19  3.80 - 5.10 M/MM3 Final    Hemoglobin 01/03/2021 13.0  12.0 - 16.0 g/dL Final    Hematocrit 67/61/9509 36.2  36.0 - 48.0 % Final    MCV 01/03/2021 86.4  82.0 - 97.0 fL Final    MCH 01/03/2021 31.0  27.0 - 34.0 pg Final    MCHC g/dL 32/67/1245  80.9  98.3 - 37.0 g/dL Final    RDW 38/25/0539 12.6  11.5 - 14.5 % Final    MPV 01/03/2021 10.0  9.4 - 12.4 fL Final    Platelet Count 01/03/2021 251  151 - 365 K/MM3 Final    Neutrophils % Auto 01/03/2021 69.2  45.0 - 75.0 % Final    Lymphocytes % Auto 01/03/2021 19.8  5.0 - 41.0 % Final    Monocytes % Auto 01/03/2021 6.8  0.0 - 10.0 % Final    Eosinophils % Auto 01/03/2021 3.4  0.0 - 4.0 % Final    Basophils % Auto 01/03/2021 0.4  0.0 - 1.0 % Final    Immature Granulocytes % Auto 01/03/2021 0.40  0.00 - 0.50 % Final    Neutrophils Abs Auto 01/03/2021 5.86 (Abnl) 2.20 - 4.80 K/MM3 Final    Lymphocytes Abs Auto 01/03/2021 1.7  1.3 - 2.9 K/MM3 Final    Monocytes Abs Auto 01/03/2021 0.6  0.3 - 0.8 K/MM3 Final    Eosinophils Abs Auto 01/03/2021 0.3 (Abnl) 0.0 - 0.2 K/MM3 Final    Basophils Abs Auto 01/03/2021 0.0  0.0 - 0.1 K/MM3 Final    Nucleated RBC/100 WBC 01/03/2021 0.0  <=0.0 % WBC Final    Nucleated Cell Count 01/03/2021 0.0  0.0 - 0.1 K/MM3 Final    Immature Granulocytes Abs Auto 01/03/2021 0.0  0.0 - 0.0 K/MM3 Final  Sed Rate Westergren 01/03/2021 39 (Abnl) 0 - 20 mm/hr Final    C-Reactive Protein 01/03/2021 1.8 (Abnl) <=1.0 mg/dL Final   Admission on 28/31/5176, Discharged on 12/24/2020   Component Date Value Ref Range Status    POC Glucose ([facility]) 12/24/2020 138 (Abnl) 70 - 100 mg/dL Final   Nursing/Ancillary Staff on 12/22/2020   Component Date Value Ref Range Status    Coronavirus by PCR [facility] 12/22/2020 Negative  Negative Final     ASSESSMENT & PLAN:   Marishka was seen today for cough.    Diagnoses and all orders for this visit:    Acute pharyngitis, unspecified etiology  -     Azithromycin (ZITHROMAX Z-PAK) 250 mg Tablet; Take 2 tablets orally today, then 1 tablet daily for four more days.    Immunization due  -     TDAP VACCINE IM  ([facility]ACEL)      Patient Instructions   Please complete the full course of antibiotic    Take Robitussin DM for the cough      Lemar Livings, NP

## 2021-01-08 NOTE — Patient Instructions (Signed)
Please complete the full course of antibiotic    Take Robitussin DM for the cough

## 2021-01-23 ENCOUNTER — Ambulatory Visit: Attending: Orthopaedic Surgery | Admitting: Rehabilitative and Restorative Service Providers"

## 2021-01-23 DIAGNOSIS — S83281D Other tear of lateral meniscus, current injury, right knee, subsequent encounter: Secondary | ICD-10-CM | POA: Insufficient documentation

## 2021-01-23 DIAGNOSIS — R29898 Other symptoms and signs involving the musculoskeletal system: Secondary | ICD-10-CM | POA: Insufficient documentation

## 2021-01-23 DIAGNOSIS — S83241D Other tear of medial meniscus, current injury, right knee, subsequent encounter: Secondary | ICD-10-CM | POA: Insufficient documentation

## 2021-01-23 DIAGNOSIS — M25561 Pain in right knee: Secondary | ICD-10-CM | POA: Insufficient documentation

## 2021-01-23 NOTE — Allied Health Progress (Signed)
PHYSICAL THERAPY EVALUATION 01/23/2021   Covid -19 patient screen completed,no fever, no SOB, no sore throat, no vomiting or diarrhea, no cough,no chills,no loss of taste or smell,no known exposure to others with symptom.Patient is wearing a mask.    Diagnosis:  Therapy diagnosis:   S83.281D (ICD-10-CM) - Tear of lateral meniscus of right knee, current, unspecified tear type, subsequent encounter   S83.241D (ICD-10-CM) - Tear of medial meniscus of right knee, current, unspecified tear type, subsequent encounter   M25.561, right knee pain  R29.898, right leg weakness    Medical diagnosis:   S83.281D (ICD-10-CM) - Tear of lateral meniscus of right knee, current, unspecified tear type, subsequent encounter   S83.241D (ICD-10-CM) - Tear of medial meniscus of right knee, current, unspecified tear type, subsequent encounter         Authorizing MD (First and last name) and PI#: Dr Mila Homer  Onset Date: 12/24/2020  Start of Care Date: 01/23/2021   Prior Level of function: has had right knee pain since 08/02/2021 which got worse until she finally required surgery.  Prior treatment for this diagnosis: no  Rehab potential: Good    Precautions: none        Plan of care:  Established 01/23/2021   1 times a week for a total of 12 treatments.   To include   Manual therapy for soft tissue mobilization, joint mobilization to patella  Therapeutic exercises for stretches, ROM exercises and strenghening exercises right LE  Neuromuscular re-education for proprioception exercises  Modalities: cold packs and electrical stimulation  Home Exercise Program  Patient/Caregiver Education    Goals:   Patient will be able to walk 15-20 minutes without being limited by pain   Patient will be able to stand 20-30 minutes without increased symptoms to be able to cook.  Patient will be independent with home exercise program and home recommendations   Able to walk with a normal gait pattern.  Increase AROM right knee to 0-110  degrees  Able to get up and down 5 steps of her mobile home using one handrail with alternate stepping pattern.          Total Evaluation time: 30 minutes  Treatment time in addition to evaluation: 30 minutes            Clinical Evaluation     SUBJECTIVE  History of current problem:                          Amanda Williamson is a 55yr old female who underwent right knee arthroscopic surgery on 12/24/2020 for partial medial and lateral menisectomy, chrondoplasty lateral femoral condyle. She is complaining of a lot of pain. She states she has constant aching and throbbing pain over her right anterior and lateral knee and also over her right lateral distal thigh and lateral lower leg rated 8/10 at worst and 7/10 at best.  Taking meloxicam   Aggravating factors include:standing, walking, any weightbearing activities.  Easing Factors include: nothing eases her right knee pain      Lower extremity functional scale score is 6/64    Medical History/Co-morbidities effecting the plan of care: see below    Past Medical History:  No date: Diabetes (HCC)  No date: Erythema nodosum  09/10/2016: H/O mammogram  No date: Kidney stones  No date: PFS (patellofemoral syndrome)  No date: PONV (postoperative nausea and vomiting)  No date: RAD (reactive airway disease)    Past Surgical History:  Procedure Laterality Date    ------------OTHER------------- Right 02/2018    rotator cuff    COLONOSCOPY  07/08/2012    WNL    COLONOSCOPY  02/24/2015    ESOPHAGOGASTRODUODENOSCOPY (EGD)  02/24/2015    LIGATION, FALLOPIAN TUBE, BILATERAL, USING TUBAL RING  1994    PR ARTHRS KNEE W/MENISCECTOMY MED&LAT W/SHAVING Right 12/24/2020    PR KNEE SCOPE,SHAVE ARTICULAR CART Right 12/24/2020           Social History/Personal and/or environmental factors effecting the plan of care:   married, live sin mobile home with 5 steps to enter.  Patient's goals are to decrease swelling, numbness and right knee pain so she can walk better.    OBJECTIVE  EXAM:  Gait:    Assistive device used: no assistive device  single point cane    Gait assessment (description of gait pattern): slow cadence , antalgic gait pattern, decreased left step length.    Inspection  Well healed surgical incision right knee  Some right knee edema  Palpation:   Very tender over right medial and lateral knee joint line, right patella tendon, distal quadriceps, right iotibial band distally and right proximal lateral lower leg.    Range of Motion  Knee   Active (Degrees)   Passive (Degrees) Normal range    Left Right Left Right    flexion 125 85 NT  NT 140   extension 0 -3 NT NT 0-10     Increased pain with right knee flexion and extension   Bilateral hips and ankles WFL's    Strength MMT  Out of 5 scale   knee Left Right   Flexion  5 3   Extension  5 2     Hip strength    Left Right   Flexion 5 2   Extension 5 3   Adduction  5 3   Abduction 5 3   Internal rotation 5 4   External rotation 5 4     Circumferential measurements were taken as follows,   Around superior pole of patella right 15 1/2 inches, left 15 inches  Around inferior pole of patella right inches14 1/2 inches, left 14  Inches       Treatment today in addition to evaluation(if applicable):    Instructed on and completed exercises 15 minutes as  follows, right quad set x10 reps 5 second hold. Right SAQ, heelslides, supine hip abduction, hooklying hip abduction, assisted SLR, LAQ x10 reps each. Assisted right knee flexion/extension for 5 minutes while on ES.Videoed patient's home exercises onher cell phone.  IFC , 80-150 Hz x20 minutes to right knee concurrently with ice pack.     Patient was educated in the anatomy and physiology involved.  Common symptoms, treatments, responses to treatment, and self management were discussed with the patient.       ASSESSMENT: The patient presents with decreased functional ability following surgery, and will benefit from physical therapy at this time to improve right knee ROM, functional mobility  and strength.      Clinical Presentation: Evolving with changing characteristics    The components of this evaluation necessitated a Low complexity level of clinical decision making.      Plan for next visit: continue exercises as tolerated.    Patient Education:      Method of Teaching: demonstration, verbal  and written hand-out     Learner: patient     Response: verbalizes understanding and able to give return demonstration  Has the plan of care been explained to the patient/caregiver? yes       Is the patient able to understand the plan of care: yes       Does the patient/caregiver(s) agree with the plan of care: yes       Are there barriers to learning? no.         Motivated to learn? yes       Best learning method: verbal, or demo      Patient/Family participation and agreement with recommendations: verbalized agreement    Is patient currently receiving outside care for this problem:     Home Health care services: no  Skilled nursing facility: no    Other no    Cala Bradford, PT  Evaluation time:1510to1540  Treatment start time:1540Treatment stop time:1610

## 2021-01-30 ENCOUNTER — Ambulatory Visit: Admitting: Rehabilitative and Restorative Service Providers"

## 2021-01-30 DIAGNOSIS — M25561 Pain in right knee: Secondary | ICD-10-CM

## 2021-01-30 DIAGNOSIS — S83281D Other tear of lateral meniscus, current injury, right knee, subsequent encounter: Secondary | ICD-10-CM

## 2021-01-30 DIAGNOSIS — R29898 Other symptoms and signs involving the musculoskeletal system: Secondary | ICD-10-CM

## 2021-01-30 DIAGNOSIS — S83241D Other tear of medial meniscus, current injury, right knee, subsequent encounter: Secondary | ICD-10-CM

## 2021-01-30 NOTE — Allied Health Progress (Signed)
Physical Therapy Treatment Note: 01/30/2021   Covid -19 patient screen completed no fever, no SOB, no sore throat, no vomiting or diarrhea, no cough, no chills, no loss of taste or smell, no known exposure to others with symptoms. Patient is wearing a mask.    Treatment start time: 1304 Treatment stop time: 1334          Total # of visits: 2/12    Total Treatment Time: 30 minutes        Subjective: still has significant right knee pain and right knee wants to buckle.    Objective: Patient seen for the following treatment:   Completed exercises 20 minutes done part of time concurrently with ES as follows, right quad set 3x10 reps 5 second hold. Right SAQ, heelslides, supine hip abduction, hooklying hip abduction, assisted SLR, LAQ 3x10 reps each.  IF , 80-150 Hz x20 minutes to right knee.    Assessment: Able to perform exercises better with less therapist assistance today.Could do a few reps right SLR unassisted.  Emphasized to patient importance of doing some right knee ROM exercises and leg lifts throughout the day.  No ice done as patient states this exacerbates her right knee pain.    Plan: continue physical therapy - as per plan of care.     Cala Bradford, PT

## 2021-02-02 ENCOUNTER — Other Ambulatory Visit (HOSPITAL_BASED_OUTPATIENT_CLINIC_OR_DEPARTMENT_OTHER): Payer: Self-pay | Admitting: Orthopaedic Surgery

## 2021-02-03 ENCOUNTER — Ambulatory Visit (HOSPITAL_BASED_OUTPATIENT_CLINIC_OR_DEPARTMENT_OTHER): Admitting: Orthopaedic Surgery

## 2021-02-03 ENCOUNTER — Encounter (HOSPITAL_BASED_OUTPATIENT_CLINIC_OR_DEPARTMENT_OTHER): Payer: Self-pay | Admitting: Orthopaedic Surgery

## 2021-02-03 VITALS — Temp 97.9°F | Ht 64.0 in | Wt 181.0 lb

## 2021-02-03 DIAGNOSIS — S83281D Other tear of lateral meniscus, current injury, right knee, subsequent encounter: Secondary | ICD-10-CM

## 2021-02-03 DIAGNOSIS — S83241D Other tear of medial meniscus, current injury, right knee, subsequent encounter: Secondary | ICD-10-CM

## 2021-02-03 NOTE — Progress Notes (Signed)
[facility] Clinic Note    PATIENT: Amanda Williamson  MR #: [id number]  DOB: [birth year]  SEX: female AGE: 39yr  SERVICE DATE: 02/03/2021    Reason for visit:   Chief Complaint   Patient presents with    Follow Up With Specialist     Right Knee DOS 12/24/2020- 4wk F/U       Subjective:  Amanda Williamson is a 21yr female presents for about 6 weeks after right knee arthroscopy for menisectomy.  Patient has had 1 or 2 sessions of therapy and is already beginning to improve.  She has started meloxicam 15 mg daily which has helped her symptoms as well.  She no longer has significant symptoms of knee giving way.  Denies any radiating paresthesias.  She states she has some numbness around the incision sites of the arthroscopic portals     Objective:  Temp 36.6 C (97.9 F) (Temporal)   Ht 1.626 m (5\' 4" )   Wt 82.1 kg (181 lb)   BMI 31.07 kg/m     PE:  Gen: NAD, comfortable    Physical Exam  Ortho Exam    right lower extremity:  Exam: Knee.  Well-healed arthroscopic incisions without erythema.  Mild effusion.  Stable collateral ligaments.  Motions about 0 to 90 degrees.  Vascular: palpable dorsalis pedis pulse, toes warm and well perfused, capillary refill < 2s  Motor function: intact DF/PF/EHL  Sensory: S/S/SP/DP/T intact to light touch    Assesment and Plan:  Amanda Williamson is a 67yr female with     (S83.281D) Tear of lateral meniscus of right knee, current, unspecified tear type, subsequent encounter  (primary encounter diagnosis)      (S83.241D) Tear of medial meniscus of right knee, current, unspecified tear type, subsequent encounter    6 weeks after arthroscopy right knee for meniscus tears.  Continue therapy for range of motion strengthening.  Weight-bear as tolerated.  Increase activity as symptoms allow.  Anticipation is of gradual resolution of symptoms.  Follow-up 6 weeks for recheck.      53yr Amanda Williamson,M.D.  02/03/2021

## 2021-02-04 ENCOUNTER — Ambulatory Visit: Attending: Orthopaedic Surgery | Admitting: Rehabilitative and Restorative Service Providers"

## 2021-02-04 DIAGNOSIS — M25561 Pain in right knee: Secondary | ICD-10-CM | POA: Insufficient documentation

## 2021-02-04 DIAGNOSIS — R29898 Other symptoms and signs involving the musculoskeletal system: Secondary | ICD-10-CM

## 2021-02-04 NOTE — Allied Health Progress (Addendum)
Physical Therapy Treatment Note: 02/04/2021   Covid -19 patient screen completed no fever, no SOB, no sore throat, no vomiting or diarrhea, no cough, no chills, no loss of taste or smell, no known exposure to others with symptoms. Patient is wearing a mask.    Treatment start time:1010 Treatment stop time:1040          Total # of visits: 3/12    Total Treatment Time: 30 minutes    Pre pain: 4/10 right knee  Post pain:       4/10 right knee    Subjective: patient states her right knee pain is better    Objective: Patient seen for the following treatment:   Completed exercises 30 minutes asfollows, right quad set 3x10 reps 5 second hold. Right SAQ, heelslides, right SLR, right hip abduction in left sidelying, right hip adduction in right sidelying, right, right hip extension in prone, right knee flexion in prone and LAQ 3x10 reps each.  Some right knee edema  AROM right knee flexion 110 degrees    Assessment: right knee ROM improved and pain is decreasing    Plan: continue physical therapy - as per plan of care.     Cala Bradford, PT

## 2021-02-12 ENCOUNTER — Ambulatory Visit: Admitting: Rehabilitative and Restorative Service Providers"

## 2021-02-12 DIAGNOSIS — R29898 Other symptoms and signs involving the musculoskeletal system: Secondary | ICD-10-CM

## 2021-02-12 DIAGNOSIS — M25561 Pain in right knee: Secondary | ICD-10-CM

## 2021-02-12 NOTE — Allied Health Progress (Signed)
Physical Therapy Treatment Note: 02/12/2021   Covid -19 patient screen completed no fever, no SOB, no sore throat, no vomiting or diarrhea, no cough, no chills, no loss of taste or smell, no known exposure to others with symptoms. Patient is wearing a mask.    Treatment start time:1103 Treatment stop time:1131          Total # of visits: 4/12    Total Treatment Time: 28 minutes        Subjective: right knee still feels like it is going to buckle if she tries to walk at faster speeds    Objective: Patient seen for the following treatment:   Completed exercises36minutes asfollows,right SAQ 3x10 reps using 2 pounds, heelslides 3x10 reps. Right SLR , right hip abduction in left sidelying, right hip adduction in right sidelying, right, right hip extension in prone, right knee flexion in prone 3x10 reps each using 1 pound.  LAQ3x10 reps using 2 pounds.  Bridging x10 reps. Standing on foam balance pad with head turns side to side and look up and down and diagonal head movements x10 reps each.    Assessment: needs to improve right leg strength, right knee ROM improved.    Plan: continue physical therapy - as per plan of care.     Cala Bradford, PT

## 2021-02-19 ENCOUNTER — Ambulatory Visit: Admitting: Rehabilitative and Restorative Service Providers"

## 2021-02-19 ENCOUNTER — Telehealth (HOSPITAL_BASED_OUTPATIENT_CLINIC_OR_DEPARTMENT_OTHER): Payer: Self-pay | Admitting: Internal Medicine

## 2021-02-19 DIAGNOSIS — R29898 Other symptoms and signs involving the musculoskeletal system: Secondary | ICD-10-CM

## 2021-02-19 DIAGNOSIS — E785 Hyperlipidemia, unspecified: Secondary | ICD-10-CM

## 2021-02-19 DIAGNOSIS — Z0000 Encounter for general adult medical examination without abnormal findings: Secondary | ICD-10-CM

## 2021-02-19 DIAGNOSIS — M25561 Pain in right knee: Secondary | ICD-10-CM

## 2021-02-19 DIAGNOSIS — E1169 Type 2 diabetes mellitus with other specified complication: Secondary | ICD-10-CM

## 2021-02-19 NOTE — Telephone Encounter (Signed)
Signed. -ARL

## 2021-02-19 NOTE — Telephone Encounter (Signed)
Pt has upcoming appt on 02/26/21-Pt asking if she should get labs done prior. Her last A1c was on 01/25 it said to repeat in 81months

## 2021-02-19 NOTE — Allied Health Progress (Signed)
Physical Therapy Treatment Note: 02/19/2021   Covid -19 patient screen completed no fever, no SOB, no sore throat, no vomiting or diarrhea, no cough, no chills, no loss of taste or smell, no known exposure to others with symptoms. Patient is wearing a mask.    Treatment start time:1205 Treatment stop time:1230          Total # of visits: 5/12    Total Treatment Time: 25 minutes    Pre pain: 8/10 right knee  Post pain:       8/10 right knee     Subjective:c/o of increased right knee pain for past two days with increased swelling     Objective: Patient seen for the following treatment:   Manual therapy 15 minutes for soft tissue mobilization to right quadriceps and soft tissue around medial and lateral knee.  Completed exercises1minutes asfollows,right SAQ 3x10 reps, heelslides 3x10 reps. Right SLR andLAQ3x10 reps.   Limping with gait.    Assessment: right knee more swollen and painful today so minimal exercises done today.    Plan: continue physical therapy - as per plan of care.     Cala Bradford, PT

## 2021-02-20 NOTE — Telephone Encounter (Signed)
Pt. Was informed

## 2021-02-23 ENCOUNTER — Ambulatory Visit: Attending: Internal Medicine

## 2021-02-23 DIAGNOSIS — Z0000 Encounter for general adult medical examination without abnormal findings: Secondary | ICD-10-CM | POA: Insufficient documentation

## 2021-02-23 DIAGNOSIS — E669 Obesity, unspecified: Secondary | ICD-10-CM | POA: Insufficient documentation

## 2021-02-23 DIAGNOSIS — E1169 Type 2 diabetes mellitus with other specified complication: Secondary | ICD-10-CM | POA: Insufficient documentation

## 2021-02-23 LAB — HEMOGLOBIN A1C
Hgb A1C,Glucose Est Avg: 157 mg/dL
Hgb A1C: 7.1 % — ABNORMAL HIGH (ref 4.0–5.6)

## 2021-02-23 LAB — COMPREHENSIVE METABOLIC PANEL
Adjusted Calcium: 9.6 mg/dL (ref 8.7–10.2)
Alanine Transferase (ALT): 22 U/L (ref 4–56)
Alb/Glob Ratio: 1.2 (ref 1.0–1.6)
Albumin: 3.8 g/dL (ref 3.2–4.7)
Alkaline Phosphatase (ALP): 56 U/L (ref 38–126)
Aspartate Transaminase (AST): 18 U/L (ref 9–44)
BUN/ Creatinine: 30.2 — ABNORMAL HIGH (ref 7.3–21.7)
Bilirubin Total: 0.5 mg/dL (ref 0.1–2.2)
Calcium: 9.4 mg/dL (ref 8.7–10.2)
Carbon Dioxide Total: 26 mmol/L (ref 22–32)
Chloride: 105 mmol/L (ref 99–109)
Creatinine Serum: 0.63 mg/dL (ref 0.50–1.30)
E-GFR, Non-African American: 111 mL/min/{1.73_m2} (ref >60)
E-GFR: 128 mL/min/{1.73_m2} (ref >60)
Globulin: 3.1 g/dL (ref 2.2–4.2)
Glucose: 144 mg/dL — ABNORMAL HIGH (ref 70–99)
Potassium: 4.2 mmol/L (ref 3.5–5.2)
Protein: 6.9 g/dL (ref 5.9–8.2)
Sodium: 137 mmol/L (ref 134–143)
Urea Nitrogen, Blood (BUN): 19 mg/dL (ref 6–21)

## 2021-02-24 NOTE — Progress Notes (Signed)
Will discuss at 5/26 appointment. -ARL

## 2021-02-25 ENCOUNTER — Ambulatory Visit: Admitting: Rehabilitative and Restorative Service Providers"

## 2021-02-25 DIAGNOSIS — R29898 Other symptoms and signs involving the musculoskeletal system: Secondary | ICD-10-CM

## 2021-02-25 DIAGNOSIS — M25561 Pain in right knee: Secondary | ICD-10-CM

## 2021-02-25 NOTE — Allied Health Progress (Signed)
Physical Therapy Treatment Note: 02/25/2021   Covid -19 patient screen completed no fever, no SOB, no sore throat, no vomiting or diarrhea, no cough, no chills, no loss of taste or smell, no known exposure to others with symptoms. Patient is wearing a mask.    Treatment start time:1202 Treatment stop time:1230          Total # of visits: 6/12    Total Treatment Time: 28 minutes      Subjective: c/o right knee stiffness, pain less today than last week.    Objective: Patient seen for the following treatment:   Completed exercises42minutes asfollows,right SAQ 2x10 reps using 2 pounds, heelslides 3x10 reps. Right SLR ,right hip abduction in left sidelying, right hip adduction in right sidelying, right, right hip extension in prone,right knee flexion in prone 2x10 reps each using 1 pound. LAQ3x10 reps using 2 pounds.  Bridging 2x10 reps. Standing on foam balance pad with head turns side to side and look up and down and diagonal head movements x10 reps each      Assessment:  Some quads weakness and right knee feeling like it wants to buckle due to edema.    Plan: continue physical therapy - as per plan of care. To use ace wrap to decrease edema.    Cala Bradford, PT

## 2021-02-26 ENCOUNTER — Ambulatory Visit (HOSPITAL_BASED_OUTPATIENT_CLINIC_OR_DEPARTMENT_OTHER): Payer: Self-pay | Admitting: Internal Medicine

## 2021-02-28 ENCOUNTER — Other Ambulatory Visit (HOSPITAL_BASED_OUTPATIENT_CLINIC_OR_DEPARTMENT_OTHER): Payer: Self-pay | Admitting: Orthopaedic Surgery

## 2021-03-03 ENCOUNTER — Ambulatory Visit (HOSPITAL_BASED_OUTPATIENT_CLINIC_OR_DEPARTMENT_OTHER): Admitting: Internal Medicine

## 2021-03-03 VITALS — BP 128/84 | HR 71 | Temp 96.8°F | Resp 18 | Ht 64.0 in | Wt 181.0 lb

## 2021-03-03 DIAGNOSIS — E1169 Type 2 diabetes mellitus with other specified complication: Secondary | ICD-10-CM

## 2021-03-03 DIAGNOSIS — J302 Other seasonal allergic rhinitis: Secondary | ICD-10-CM

## 2021-03-03 DIAGNOSIS — I10 Essential (primary) hypertension: Secondary | ICD-10-CM

## 2021-03-03 DIAGNOSIS — E785 Hyperlipidemia, unspecified: Secondary | ICD-10-CM

## 2021-03-03 DIAGNOSIS — E669 Obesity, unspecified: Secondary | ICD-10-CM

## 2021-03-03 DIAGNOSIS — M1711 Unilateral primary osteoarthritis, right knee: Secondary | ICD-10-CM

## 2021-03-03 NOTE — Progress Notes (Signed)
Family Practice Clinic Visit     Date: 03/03/2021  Patient Name: Natlie Asfour    CHIEF COMPLAINT:   Chief Complaint   Patient presents with    Diabetes     4 month follow up        Patient WAS wearing a cloth mask during visit  Contact precautions were followed when caring for the patient.   PPE used by provider during encounter: procedure mask      HISTORY OF PRESENT ILLNESS:    Patient has been well since her last evaluation, which she has undergone right knee surgery with Dr. Faylene Million on 12/24/2020.  She continues with physical therapy to ensure proper mobility and strength are regained.  Patient had follow-up DM labs performed prior to today's visit, which indicate elevated BUN of 30.2 with normal E-GFR.  Labs remain unremarkably changed, hemoglobin A1c well controlled at 7.1%.  Patient remains active as she helps her daughters with her new grandbabies, which she continues to notice increased nasal congestion during tree pollen season.      Review of Systems   Constitutional: Positive for activity change. Negative for chills, fatigue and fever.   HENT: Negative.    Respiratory: Negative.    Cardiovascular: Negative.    Gastrointestinal: Negative.    Endocrine: Negative for polydipsia, polyphagia and polyuria.   Genitourinary: Negative for urgency.   Musculoskeletal: Negative for arthralgias, gait problem, neck pain and neck stiffness.   Skin: Negative.    Neurological: Negative.    Psychiatric/Behavioral: The patient is not nervous/anxious.        Note: This is a self-reported Review of Systems.    Vitals: BP 128/84 (SITE: left arm, Orthostatic Position: sitting, Cuff Size: regular)   Pulse 71   Temp 36 C (96.8 F) (Temporal)   Resp 18   Ht 1.626 m (5\' 4" )   Wt 82.1 kg (181 lb)   SpO2 99%   BMI 31.07 kg/m   No LMP recorded. Patient is perimenopausal.    Physical Exam  Constitutional:       Appearance: Normal appearance. She is well-groomed. She is obese.   Cardiovascular:      Rate and Rhythm: Normal  rate and regular rhythm.      Pulses: Normal pulses.      Heart sounds: Normal heart sounds.   Pulmonary:      Effort: Pulmonary effort is normal.      Breath sounds: Normal breath sounds.   Abdominal:      General: Bowel sounds are normal.      Palpations: Abdomen is soft.   Skin:     General: Skin is warm and dry.   Neurological:      Mental Status: She is alert.   Psychiatric:         Behavior: Behavior is cooperative.          ASSESSMENT AND PLAN:    ICD-10-CM    1. Diabetes mellitus type 2 in obese (HCC)  E11.69     E66.9    2. Primary hypertension  I10    3. Osteoarthritis of right knee, unspecified osteoarthritis type  M17.11    4. Dyslipidemia  E78.5    5. Seasonal allergies  J30.2       -HTN stable, no elevated BP readings or associated symptoms  -DM stable, no elevated BP readings, compliant with all chronic medications  -Continue with regularly scheduled physical therapy and follow-up with Dr. ; alarming symptoms and ED precautions reviewed  -  Continue with fluticasone nasal spray and Zyrtec 10 mg daily for seasonal allergic rhinitis  -Additional dietary and lifestyle changes reviewed to address elevated BMI and hyperlipidemia  -RTO in 7 months for annual physical examination, sooner if needed         Return in about 7 months (around 10/03/2021) for physical - 40 mins.    Progress notes reviewed and authenticated by Omelia Blackwater, PA-C 03/03/2021 10:00 AM

## 2021-03-05 ENCOUNTER — Ambulatory Visit: Attending: Orthopaedic Surgery | Admitting: Rehabilitative and Restorative Service Providers"

## 2021-03-05 DIAGNOSIS — R29898 Other symptoms and signs involving the musculoskeletal system: Secondary | ICD-10-CM

## 2021-03-05 DIAGNOSIS — M25561 Pain in right knee: Secondary | ICD-10-CM | POA: Insufficient documentation

## 2021-03-05 NOTE — Allied Health Progress (Signed)
Physical Therapy Treatment Note: 03/05/2021  Covid -19 patient screen completed no fever, no SOB, no sore throat, no vomiting or diarrhea, no cough, no chills, no loss of taste or smell, no known exposure to others with symptoms. Patient is wearing a mask.    Treatment start time:1133 Treatment stop time:1201        Total # of visits: 7/12    Total Treatment Time: 28 minutes        Subjective: reports right knee pain is less.    Objective: Patient seen for the following treatment:   Completed exercises18minutes asfollows,right heelslides3x10 reps. Right SLR ,right hip abduction in left sidelying, right hip adduction in right sidelying, right, right hip extension in prone,right knee flexion in prone2x10 reps each using 1 pound 5 second hold. right SAQ and LAQ3x10 repsusing 3 pounds.  Bridging 2x10 reps 5 second hold. Standing on foam balance pad with head turns side to side and look up and down and diagonal head movements x10 reps each      Assessment: able to complete exercises today with less right knee pain    Plan: continue physical therapy - as per plan of care.     Cala Bradford, PT

## 2021-03-12 ENCOUNTER — Ambulatory Visit: Admitting: Rehabilitative and Restorative Service Providers"

## 2021-03-12 DIAGNOSIS — M25561 Pain in right knee: Secondary | ICD-10-CM

## 2021-03-12 DIAGNOSIS — R29898 Other symptoms and signs involving the musculoskeletal system: Secondary | ICD-10-CM

## 2021-03-12 NOTE — Allied Health Progress (Signed)
Physical Therapy Treatment Note: 03/12/2021   Covid -19 patient screen completed no fever, no SOB, no sore throat, no vomiting or diarrhea, no cough, no chills, no loss of taste or smell, no known exposure to others with symptoms. Patient is wearing a mask.    Treatment start time:1302 Treatment stop time:1330        Total # of visits: 8/12    Total Treatment Time: 28 minutes       Subjective: c/o right hip pain, right knee pain 2/10 at worst today     Objective: Patient seen for the following treatment:   Completed exercises71minutes asfollows,right heelslides3x10 reps. Right SLR,right hip abduction in left sidelying, right hip adduction in right sidelying, right, right hip extension in prone,right knee flexion in prone2x10 reps each using 1 1/2 pound 5 second hold. Right SAQ and LAQ3x10 repsusing 5 pounds.  Bridging2x10 reps 5 second hold. Minisquats x10 reps   Romberg on foam balance pad with head turns side to side and look up and down and diagonal head movements x10 reps each      Assessment: right knee pain decreasing and right leg strength increasing    Plan: continue physical therapy - as per plan of care.     Cala Bradford, PT

## 2021-03-18 ENCOUNTER — Ambulatory Visit: Admitting: Rehabilitative and Restorative Service Providers"

## 2021-03-19 ENCOUNTER — Telehealth (HOSPITAL_BASED_OUTPATIENT_CLINIC_OR_DEPARTMENT_OTHER): Payer: Self-pay

## 2021-03-19 DIAGNOSIS — U071 COVID-19: Secondary | ICD-10-CM

## 2021-03-19 MED ORDER — NIRMATRELVIR 300 MG (150 MG X2)-RITONAVIR 100 MG TABLET,DOSE PACK
ORAL_TABLET | ORAL | 0 refills | Status: AC
Start: 2021-03-19 — End: 2021-03-24

## 2021-03-19 NOTE — Telephone Encounter (Signed)
[facility]  RN Triage Nurse       Name: Amanda Williamson   MRN: [id number]  PCP: Audrea Muscat, PA     RN Narrative:    Pt states she developed Covid s/s on 03/18/21 and tested positive today.  Pt reports she has a sore throat, HA, body aches, chills, "slight" cough, nausea, diarrhea, and fatigue.  Pt reports she has a hx of DM.  Pt has had a Covid mRNA vaccine x3.  Scheduled appointment.  Pt would like to discuss antivirals during appointment.  Education completed and pt verbalized understanding.      Clear Triage Protocol(s) Used:   Protocol Used: COVID-19 - Diagnosed or Suspected (Adult)  Protocol-Based Disposition: Discuss with PCP and Callback by Nurse within 1 Hour    Positive Triage Question:  * HIGH RISK for severe COVID complications (e.g., weak immune system, age > 64 years, obesity with BMI > 25, pregnant, chronic lung disease or other chronic medical condition) (Exception: Already seen by PCP and no new or worsening symptoms.)  * All higher-acuity triage questions were negative    Care Advice Discussed:  * Reassurance and Education - Positive COVID-19 Lab Test and Mild Symptoms  * General Care Advice for COVID-19 Symptoms  * Reasons To Call Back      - Fever over 103 F (39.4 C)      - Fever lasts over 3 days      - Fever returns after being gone for 24 hours      - Chest pain or difficulty breathing occurs      - You become worse  * COVID-19 - How to Protect Others - When You Are Sick With COVID-19  * Reasons To Call Back      - You have more questions      Zadie Rhine, RN

## 2021-03-20 ENCOUNTER — Encounter (HOSPITAL_BASED_OUTPATIENT_CLINIC_OR_DEPARTMENT_OTHER): Payer: Self-pay | Admitting: NURSE PRACTITIONER

## 2021-04-04 ENCOUNTER — Other Ambulatory Visit (HOSPITAL_BASED_OUTPATIENT_CLINIC_OR_DEPARTMENT_OTHER): Payer: Self-pay | Admitting: Family Medicine

## 2021-04-04 DIAGNOSIS — J019 Acute sinusitis, unspecified: Secondary | ICD-10-CM

## 2021-04-05 ENCOUNTER — Other Ambulatory Visit (HOSPITAL_BASED_OUTPATIENT_CLINIC_OR_DEPARTMENT_OTHER): Payer: Self-pay | Admitting: Orthopaedic Surgery

## 2021-04-07 NOTE — Telephone Encounter (Signed)
Recent Visits  Date Type Provider Dept   03/03/21 Office Visit Audrea Muscat, [place] [facility]   02/19/21 Telephone Davonna Belling, Elmarie Mainland, [place] [facility]   01/08/21 Office Visit Tillman Sers, [facility]   10/29/20 Office Visit Audrea Muscat, [place] [facility]   09/19/20 Office Visit Jose Persia, Kirt Boys, [facility]   07/23/20 Telephone Davonna Belling, Elmarie Mainland, [place] [facility]   07/23/20 Telephone Davonna Belling, Elmarie Mainland, [place] [facility]   07/22/20 Office Visit Shon Baton, [facility]   07/21/20 Telephone Davonna Belling, Elmarie Mainland, [place] [facility]   07/16/20 Office Visit Lewkowitz, Elmarie Mainland, [place] [facility]   Showing recent visits within past 365 days with a meds authorizing provider and meeting all other requirements  Future Appointments  Date Type Provider Dept   09/24/21 Appointment Davonna Belling, Elmarie Mainland, [facility]   Showing future appointments within next 180 days with a meds authorizing provider and meeting all other requirements

## 2021-04-08 ENCOUNTER — Other Ambulatory Visit (HOSPITAL_BASED_OUTPATIENT_CLINIC_OR_DEPARTMENT_OTHER): Payer: Self-pay | Admitting: Family Medicine

## 2021-04-08 DIAGNOSIS — I10 Essential (primary) hypertension: Secondary | ICD-10-CM

## 2021-04-08 DIAGNOSIS — E785 Hyperlipidemia, unspecified: Secondary | ICD-10-CM

## 2021-04-08 DIAGNOSIS — E1169 Type 2 diabetes mellitus with other specified complication: Secondary | ICD-10-CM

## 2021-04-08 NOTE — Telephone Encounter (Signed)
BP Readings from Last 3 Encounters:   03/03/21 128/84   01/08/21 120/80   01/03/21 126/87      Pulse Readings from Last 3 Encounters:   03/03/21 71   01/08/21 94   01/03/21 77     Lab Results   Component Value Date    NA 137 02/23/2021    K 4.2 02/23/2021    CR 0.63 02/23/2021    LDLC 90 10/28/2020    HDL 42 10/28/2020     Recent Visits  Date Type Provider Dept   03/03/21 Office Visit Lewkowitz, Elmarie Mainland, PA [facility]   02/19/21 Telephone Lewkowitz, Elmarie Mainland, PA [facility]   01/08/21 Office Visit Tillman Sers, NP [facility]   10/29/20 Office Visit Lewkowitz, Elmarie Mainland, [place] [facility]   09/19/20 Office Visit Jose Persia, Kirt Boys, NP [facility]   07/23/20 Telephone Lewkowitz, Elmarie Mainland, [facility]   07/23/20 Telephone Davonna Belling, Elmarie Mainland, PA [facility]   07/22/20 Office Visit Jose Persia, Kirt Boys, NP [facility]   07/21/20 Telephone Lewkowitz, Elmarie Mainland, PA [facility]   07/16/20 Office Visit Lewkowitz, Elmarie Mainland, PA [facility]   Showing recent visits within past 365 days with a meds authorizing provider and meeting all other requirements  Future Appointments  Date Type Provider Dept   09/24/21 Appointment Lewkowitz, Elmarie Mainland, PA [facility]   Showing future appointments within next 180 days with a meds authorizing provider and meeting all other requirements    Cardiac Medications in Epic (for confirmation)     Heart Failure Medication             Lisinopril (PRINIVIL, ZESTRIL) 2.5 mg Tablet TAKE 1 TABLET BY MOUTH EVERY DAY         Hypertensive Medication             Lisinopril (PRINIVIL, ZESTRIL) 2.5 mg Tablet TAKE 1 TABLET BY MOUTH EVERY DAY         Lipid Medication             Simvastatin (ZOCOR) 10 mg Tablet TAKE 1 TABLET BY MOUTH EVERY DAY         Lab Results   Component Value Date    AST 18 02/23/2021    ALT 22  02/23/2021    CHOL 146 10/28/2020    LDLC 90 10/28/2020    LDLC 63 03/14/2020    HDL 42 10/28/2020    TRIG 69 10/28/2020          Lipid Medications in Epic (for confirmation)  Lipid Medication             Simvastatin (ZOCOR) 10 mg Tablet TAKE 1 TABLET BY MOUTH EVERY DAY        Lab Results   Component Value Date    HGBA1C 7.1 (H) 02/23/2021    HGBA1C 7.0 (H) 10/28/2020    HGBA1C 6.9 (H) 07/10/2020        DM Medications in Epic (for confirmation)  Diabetes Medication             Metformin (GLUCOPHAGE) 1,000 mg tablet TAKE 1 TABLET BY MOUTH TWICE A DAY WITH MEALS

## 2021-04-13 ENCOUNTER — Ambulatory Visit (HOSPITAL_BASED_OUTPATIENT_CLINIC_OR_DEPARTMENT_OTHER): Admitting: Orthopaedic Surgery

## 2021-04-13 ENCOUNTER — Encounter (HOSPITAL_BASED_OUTPATIENT_CLINIC_OR_DEPARTMENT_OTHER): Payer: Self-pay | Admitting: Orthopaedic Surgery

## 2021-04-13 VITALS — Temp 97.8°F | Ht 64.0 in | Wt 181.0 lb

## 2021-04-13 DIAGNOSIS — S83281D Other tear of lateral meniscus, current injury, right knee, subsequent encounter: Secondary | ICD-10-CM

## 2021-04-13 NOTE — Progress Notes (Signed)
[facility] Note    PATIENT: Amanda Williamson  MR #: [id number]  DOB: Feb 19, 1966  SEX: female AGE: 38yr  SERVICE DATE: 04/13/2021    Reason for visit:   Chief Complaint   Patient presents with    Follow Up With Specialist     Right Knee DOS 12/24/2020- 2 month F/U       Subjective:  Amanda Williamson is a 37yr female presents for almost 4 months after right knee scope and partial lateral menisectomy. Working on home program for gentle strengthening. Swelling largely resolved. Has paraesthesias anterior knee.     Objective:  Temp 36.6 C (97.8 F) (Temporal)   Ht 1.626 m (5\' 4" )   Wt 82.1 kg (181 lb)   BMI 31.07 kg/m     PE:  Gen: NAD, comfortable    Physical Exam  Ortho Exam      right lower extremity:  Exam: knee. Normal sharp dull sensation. Trace swelling. No JLT.   Vascular: palpable dorsalis pedis pulse, toes warm and well perfused, capillary refill < 2s  Motor function: intact DF/PF/EHL  ROM: 0 - 120  Sensory: S/S/SP/DP/T intact to light touch    Assesment and Plan:  Amanda Williamson is a 77yr female with improving 80% subjectively right knee. Continue home therapy program and gradually increase activity. Follow up PRN.    53yr Amanda Williamson,M.D.  04/13/2021

## 2021-05-06 ENCOUNTER — Other Ambulatory Visit (HOSPITAL_BASED_OUTPATIENT_CLINIC_OR_DEPARTMENT_OTHER): Payer: Self-pay | Admitting: Orthopaedic Surgery

## 2021-06-08 ENCOUNTER — Other Ambulatory Visit (HOSPITAL_BASED_OUTPATIENT_CLINIC_OR_DEPARTMENT_OTHER): Payer: Self-pay | Admitting: Orthopaedic Surgery

## 2021-07-06 ENCOUNTER — Other Ambulatory Visit (HOSPITAL_BASED_OUTPATIENT_CLINIC_OR_DEPARTMENT_OTHER): Payer: Self-pay | Admitting: Internal Medicine

## 2021-07-06 DIAGNOSIS — Z23 Encounter for immunization: Secondary | ICD-10-CM

## 2021-07-12 ENCOUNTER — Other Ambulatory Visit (HOSPITAL_BASED_OUTPATIENT_CLINIC_OR_DEPARTMENT_OTHER): Payer: Self-pay | Admitting: Orthopaedic Surgery

## 2021-07-25 ENCOUNTER — Other Ambulatory Visit (HOSPITAL_BASED_OUTPATIENT_CLINIC_OR_DEPARTMENT_OTHER): Payer: Self-pay | Admitting: Family Medicine

## 2021-07-25 DIAGNOSIS — I10 Essential (primary) hypertension: Secondary | ICD-10-CM

## 2021-07-27 NOTE — Telephone Encounter (Signed)
BP Readings from Last 3 Encounters:   03/03/21 128/84   01/08/21 120/80   01/03/21 126/87      Pulse Readings from Last 3 Encounters:   03/03/21 71   01/08/21 94   01/03/21 77     Lab Results   Component Value Date    NA 137 02/23/2021    K 4.2 02/23/2021    CR 0.63 02/23/2021    LDLC 90 10/28/2020    HDL 42 10/28/2020     Recent Visits  Date Type Provider Dept   03/03/21 Office Visit Lewkowitz, Elmarie Mainland, [facility]   02/19/21 Telephone Lewkowitz, Elmarie Mainland, [facility]   01/08/21 Office Visit Tillman Sers, NP [facility]   10/29/20 Office Visit Lewkowitz, Elmarie Mainland, [place] [facility]   09/19/20 Office Visit Jose Persia, Kirt Boys, NP [facility]   Showing recent visits within past 365 days with a meds authorizing provider and meeting all other requirements  Future Appointments  Date Type Provider Dept   09/24/21 Appointment Lewkowitz, Elmarie Mainland, PA [facility]   Showing future appointments within next 180 days with a meds authorizing provider and meeting all other requirements    Cardiac Medications in Epic (for confirmation)     Heart Failure Medication               Lisinopril (PRINIVIL, ZESTRIL) 2.5 mg Tablet TAKE 1 TABLET BY MOUTH EVERY DAY           Hypertensive Medication               Lisinopril (PRINIVIL, ZESTRIL) 2.5 mg Tablet TAKE 1 TABLET BY MOUTH EVERY DAY           Lipid Medication               Simvastatin (ZOCOR) 10 mg Tablet TAKE 1 TABLET BY MOUTH EVERY DAY

## 2021-08-21 ENCOUNTER — Other Ambulatory Visit (HOSPITAL_BASED_OUTPATIENT_CLINIC_OR_DEPARTMENT_OTHER): Payer: Self-pay | Admitting: Orthopaedic Surgery

## 2021-09-20 ENCOUNTER — Other Ambulatory Visit (HOSPITAL_BASED_OUTPATIENT_CLINIC_OR_DEPARTMENT_OTHER): Payer: Self-pay | Admitting: Orthopaedic Surgery

## 2021-09-24 ENCOUNTER — Encounter (HOSPITAL_BASED_OUTPATIENT_CLINIC_OR_DEPARTMENT_OTHER): Payer: Self-pay | Admitting: Internal Medicine

## 2021-10-10 ENCOUNTER — Other Ambulatory Visit (HOSPITAL_BASED_OUTPATIENT_CLINIC_OR_DEPARTMENT_OTHER): Payer: Self-pay | Admitting: Family Medicine

## 2021-10-10 DIAGNOSIS — E785 Hyperlipidemia, unspecified: Secondary | ICD-10-CM

## 2021-10-10 DIAGNOSIS — E669 Obesity, unspecified: Secondary | ICD-10-CM

## 2021-10-10 DIAGNOSIS — E1169 Type 2 diabetes mellitus with other specified complication: Secondary | ICD-10-CM

## 2021-10-12 NOTE — Telephone Encounter (Signed)
Lab Results   Component Value Date    AST 18 02/23/2021    ALT 22 02/23/2021    CHOL 146 10/28/2020    LDLC 90 10/28/2020    LDLC 63 03/14/2020    HDL 42 10/28/2020    TRIG 69 10/28/2020     Recent Visits  Date Type Provider Dept   03/03/21 Office Visit Lewkowitz, Elmarie Mainland, PA [facility]   02/19/21 Telephone Davonna Belling, Elmarie Mainland, [place] [facility]   01/08/21 Office Visit Tillman Sers, NP [facility]   10/29/20 Office Visit Lewkowitz, Elmarie Mainland, PA [facility]   Showing recent visits within past 365 days with a meds authorizing provider and meeting all other requirements  Future Appointments  No visits were found meeting these conditions.  Showing future appointments within next 180 days with a meds authorizing provider and meeting all other requirements     Lipid Medications in Epic (for confirmation)    Lab Results   Component Value Date    HGBA1C 7.1 (H) 02/23/2021    HGBA1C 7.0 (H) 10/28/2020    HGBA1C 6.9 (H) 07/10/2020      DM Medications in Epic (for confirmation)

## 2021-10-21 ENCOUNTER — Other Ambulatory Visit (HOSPITAL_BASED_OUTPATIENT_CLINIC_OR_DEPARTMENT_OTHER): Payer: Self-pay | Admitting: Orthopaedic Surgery

## 2021-11-05 ENCOUNTER — Other Ambulatory Visit: Payer: Self-pay | Admitting: Primary Care

## 2021-11-05 DIAGNOSIS — Z1231 Encounter for screening mammogram for malignant neoplasm of breast: Secondary | ICD-10-CM

## 2021-11-19 ENCOUNTER — Other Ambulatory Visit (HOSPITAL_BASED_OUTPATIENT_CLINIC_OR_DEPARTMENT_OTHER): Payer: Self-pay | Admitting: Orthopaedic Surgery

## 2021-12-30 ENCOUNTER — Other Ambulatory Visit (HOSPITAL_BASED_OUTPATIENT_CLINIC_OR_DEPARTMENT_OTHER): Payer: Self-pay | Admitting: Orthopaedic Surgery

## 2022-02-07 ENCOUNTER — Other Ambulatory Visit (HOSPITAL_BASED_OUTPATIENT_CLINIC_OR_DEPARTMENT_OTHER): Payer: Self-pay | Admitting: Family Medicine

## 2022-02-07 DIAGNOSIS — J019 Acute sinusitis, unspecified: Secondary | ICD-10-CM

## 2022-02-08 NOTE — Telephone Encounter (Signed)
Last refill: 11/01/2021    BP Readings from Last 1 Encounters:   03/03/21 128/84      Pulse Readings from Last 1 Encounters:   03/03/21 71     Lab Results   Component Value Date    WBC 8.5 01/03/2021    HGB 13.0 01/03/2021    PLT 251 01/03/2021    NA 137 02/23/2021    K 4.2 02/23/2021    GLU 144 (H) 02/23/2021    CR 0.63 02/23/2021    ALT 22 02/23/2021     Recent Visits  Date Type Provider Dept   03/03/21 Office Visit Lewkowitz, Danford Bad, PA [facility]   02/19/21 Telephone [place], Danford Bad, [place] [facility]   Showing recent visits within past 365 days with a meds authorizing provider and meeting all other requirements  Future Appointments  Date Type Provider Dept   02/11/22 Appointment Lewkowitz, Danford Bad, [place] [facility]   Showing future appointments within next 180 days with a meds authorizing provider and meeting all other requirements        CURES Audit Trail           User Date Status   HAMMOND, [place] 10/21/2020 12:14 PM  10/29/2020  4:54 PM  12/05/2020 12:43 PM Reviewed PDMP [1]  Reviewed PDMP [1]  Reviewed PDMP [1]               Current Medications  has a current medication list which includes the following prescription(s): acetaminophen, azelastine nasal, ferrous sulfate, fluticasone, l.acid/l.casei/b.bif/b.lon/fos, lisinopril, magnesium, meloxicam, metformin, multivitamin, psyllium husk, simvastatin, and turmeric.

## 2022-02-11 ENCOUNTER — Encounter (HOSPITAL_BASED_OUTPATIENT_CLINIC_OR_DEPARTMENT_OTHER): Payer: Self-pay | Admitting: Internal Medicine

## 2022-02-11 ENCOUNTER — Ambulatory Visit (HOSPITAL_BASED_OUTPATIENT_CLINIC_OR_DEPARTMENT_OTHER): Admitting: Internal Medicine

## 2022-02-11 VITALS — BP 124/82 | HR 89 | Temp 97.0°F | Resp 16 | Ht 64.0 in | Wt 178.0 lb

## 2022-02-11 DIAGNOSIS — J302 Other seasonal allergic rhinitis: Secondary | ICD-10-CM

## 2022-02-11 DIAGNOSIS — E669 Obesity, unspecified: Secondary | ICD-10-CM

## 2022-02-11 DIAGNOSIS — E785 Hyperlipidemia, unspecified: Secondary | ICD-10-CM

## 2022-02-11 DIAGNOSIS — E1169 Type 2 diabetes mellitus with other specified complication: Secondary | ICD-10-CM

## 2022-02-11 DIAGNOSIS — I10 Essential (primary) hypertension: Secondary | ICD-10-CM

## 2022-02-11 DIAGNOSIS — Z6830 Body mass index (BMI) 30.0-30.9, adult: Secondary | ICD-10-CM

## 2022-02-11 DIAGNOSIS — G63 Polyneuropathy in diseases classified elsewhere: Secondary | ICD-10-CM

## 2022-02-11 NOTE — Progress Notes (Addendum)
Family Practice Clinic Visit     Date: 02/11/2022  Patient Name: Amanda Williamson    CHIEF COMPLAINT:   Chief Complaint   Patient presents with    Leg Problem     Both legs and foot,Tingling and numbness of legs and feet since 10/2021          HISTORY OF PRESENT ILLNESS:    Due to temporary insurance changes, pt was evaluated at [facility], which comprehensive labs were performed. She was previously prescribed gabapentin for formication, did not take due to possible adverse effects. Today she elicits neuropathy affecting bilateral feet since January. She denies any recent falls or acute injuries, uncertain if this is due to DM.      Review of Systems   Constitutional:  Negative for activity change, appetite change and fatigue.   HENT: Negative.     Respiratory:  Negative for shortness of breath.    Cardiovascular:  Negative for chest pain, palpitations and leg swelling.   Gastrointestinal:  Negative for abdominal pain, constipation, diarrhea, nausea and vomiting.   Endocrine: Negative for cold intolerance and heat intolerance.   Genitourinary: Negative.    Musculoskeletal:  Negative for arthralgias, back pain and gait problem.   Skin: Negative.    Allergic/Immunologic: Positive for environmental allergies.   Neurological:  Positive for numbness.   Psychiatric/Behavioral:  Negative for behavioral problems, dysphoric mood and sleep disturbance. The patient is nervous/anxious.        Note: This is a self-reported Review of Systems.    Vitals: BP 124/82 (SITE: left arm, Orthostatic Position: sitting, Cuff Size: regular)   Pulse 89   Temp 36.1 C (97 F) (Temporal)   Resp 16   Ht 1.626 m (5\' 4" )   Wt 80.7 kg (178 lb)   SpO2 100%   BMI 30.55 kg/m   No LMP recorded. Patient is perimenopausal.    Physical Exam  Constitutional:       Appearance: Normal appearance.   Cardiovascular:      Rate and Rhythm: Normal rate and regular rhythm.      Heart sounds: Normal heart sounds.   Pulmonary:      Effort: No respiratory distress.       Breath sounds: Normal breath sounds.   Abdominal:      General: Bowel sounds are normal. There is no distension.      Palpations: Abdomen is soft.      Tenderness: There is no abdominal tenderness. There is no guarding.   Feet:      Right foot:      Protective Sensation: 10 sites tested.  2 sites sensed.      Left foot:      Protective Sensation: 10 sites tested.  0 sites sensed.   Neurological:      Mental Status: She is alert.   Psychiatric:         Behavior: Behavior is cooperative.          ASSESSMENT AND PLAN:    ICD-10-CM    1. Essential hypertension  I10       2. Dyslipidemia  E78.5 Lipid Panel      3. Diabetes mellitus type 2 in obese (HCC)  E11.69 Comprehensive Metabolic Panel    E66.9 Hemoglobin A1C     Microalbumin      4. Seasonal allergies  J30.2 CBC with Differential      5. Polyneuropathy associated with underlying disease (HCC)  G63       6. Class  1 obesity with body mass index (BMI) of 30.0 to 30.9 in adult, unspecified obesity type, unspecified whether serious comorbidity present  E66.9     Z68.30          -START gabapentin 300 qhs; titrate up to TID for polyneuropathy  -If no improvement, may consider Lyrica; adverse effects and alarming  symptoms reviewed  -Pt advised to complete labs 7 days prior to next appointment  -A1C 6.6% performed 3/31; will repeat prior to physical  -HTN stable; no elevated BP readings or associated readings  -HLD stable, no muscle wasting or soreness  -CONTINUE with fluticasone nasal spray daily for seasonal allergic rhinitis  -CONTINUE to work on diet and exercise for BMI and chronic issues  -RTO in 2 months for well woman examination with PAP, sooner if needed      Return in about 2 months (around 04/13/2022) for well woman with PAP - 40 minutes .    Progress notes reviewed and authenticated by Omelia Blackwater, PA-C 02/11/2022 2:05 PM    I have reviewed the chart and agree with assessment and plan.     Christena Flake, MD

## 2022-02-12 ENCOUNTER — Other Ambulatory Visit (HOSPITAL_BASED_OUTPATIENT_CLINIC_OR_DEPARTMENT_OTHER): Payer: Self-pay | Admitting: Orthopaedic Surgery

## 2022-03-12 IMAGING — MG DIGITAL SCREENING BILAT W/ TOMO W/ CAD
8 series · 9 of 24 positions shown · non-contrast
Comparison: Previous exam(s).

CLINICAL DATA: Screening.

EXAM:
DIGITAL SCREENING BILATERAL MAMMOGRAM WITH TOMO AND CAD

[R CC synth-2D]
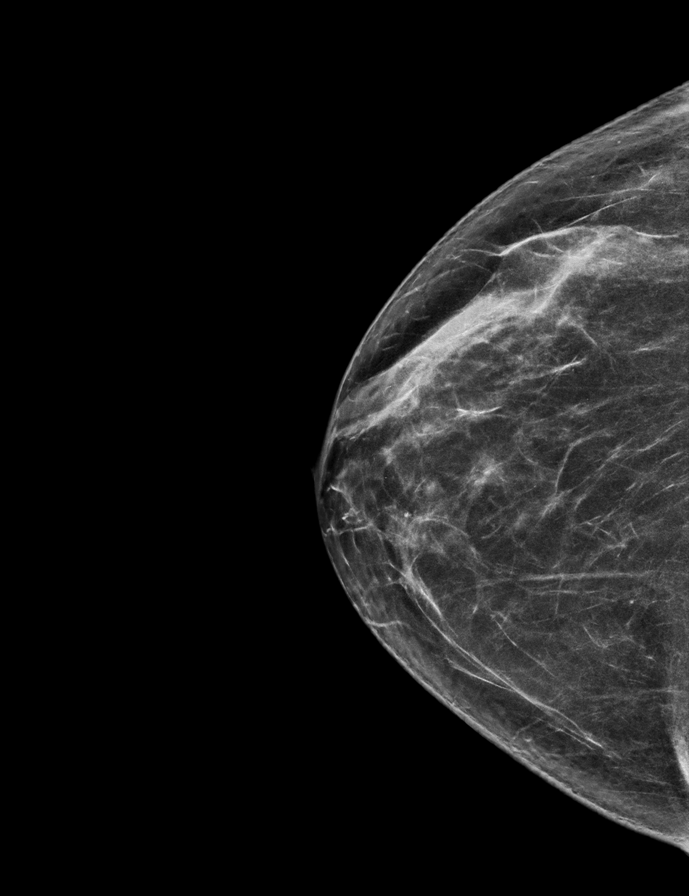

[L MLO synth-2D]
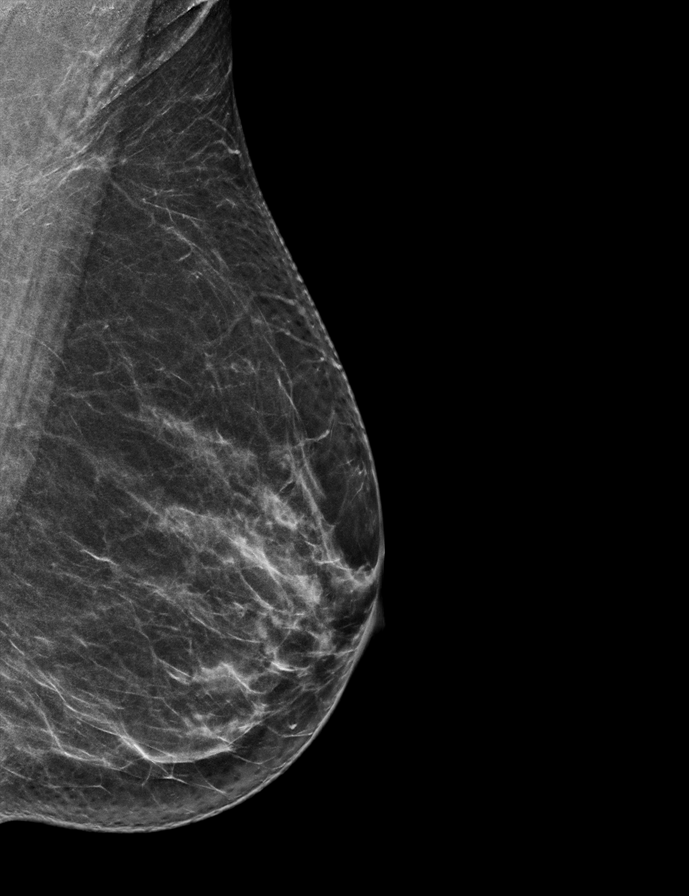

[L CC synth-2D]
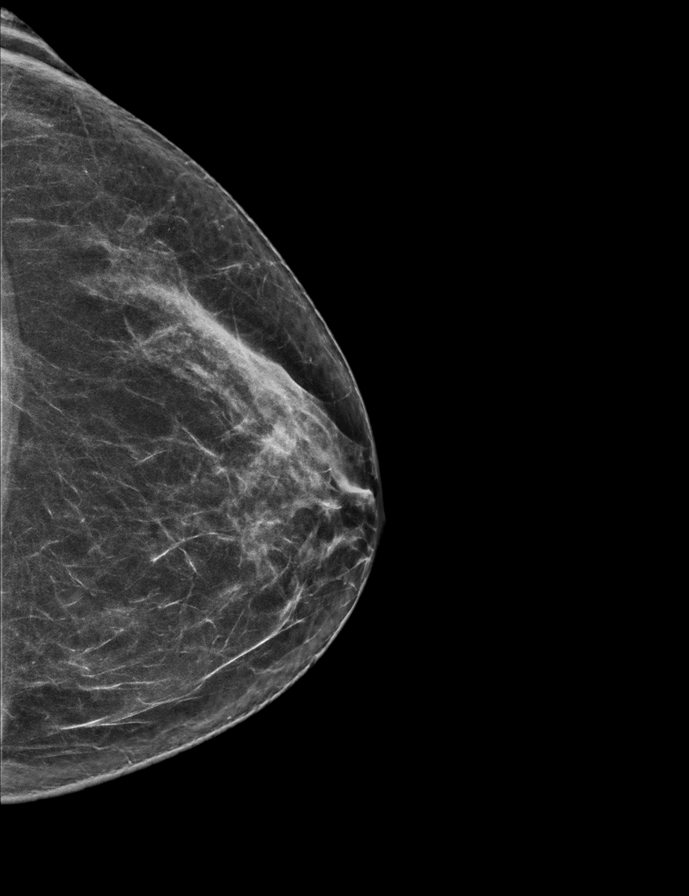

[R MLO synth-2D]
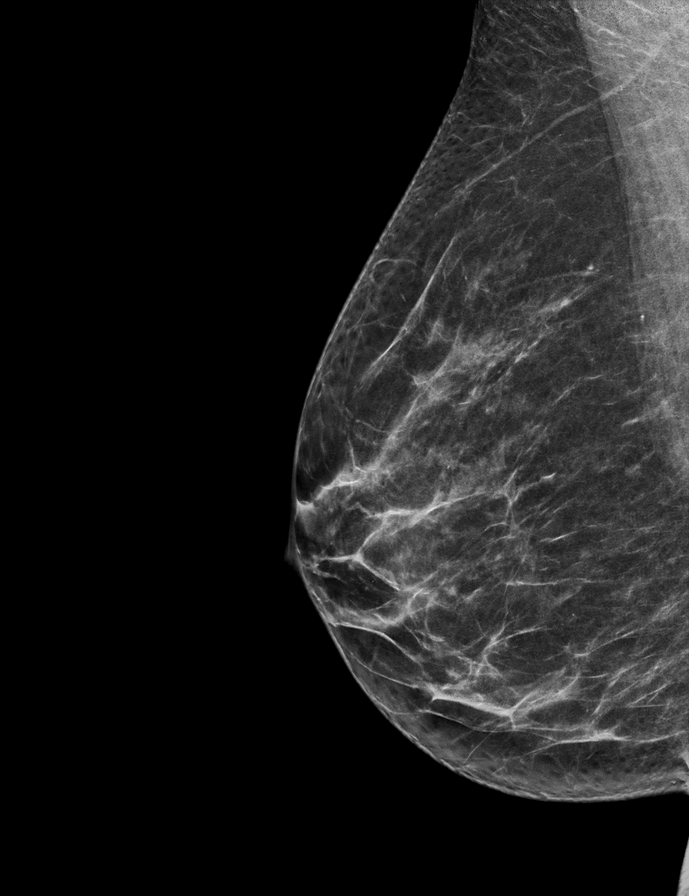

[R MLO tomo · 2 of 58 frames shown]
[frame 19/58]
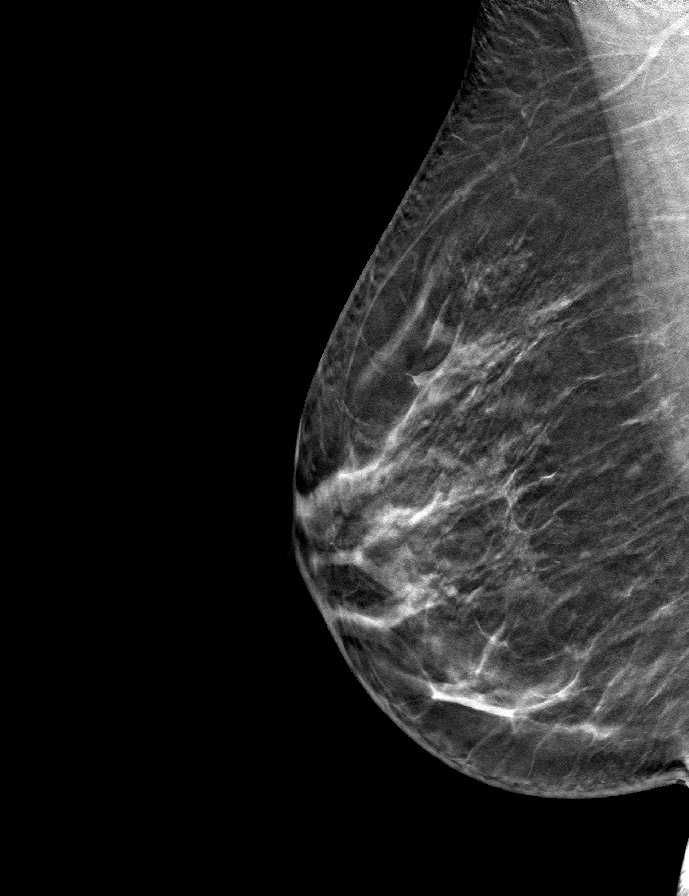
[frame 29/58]
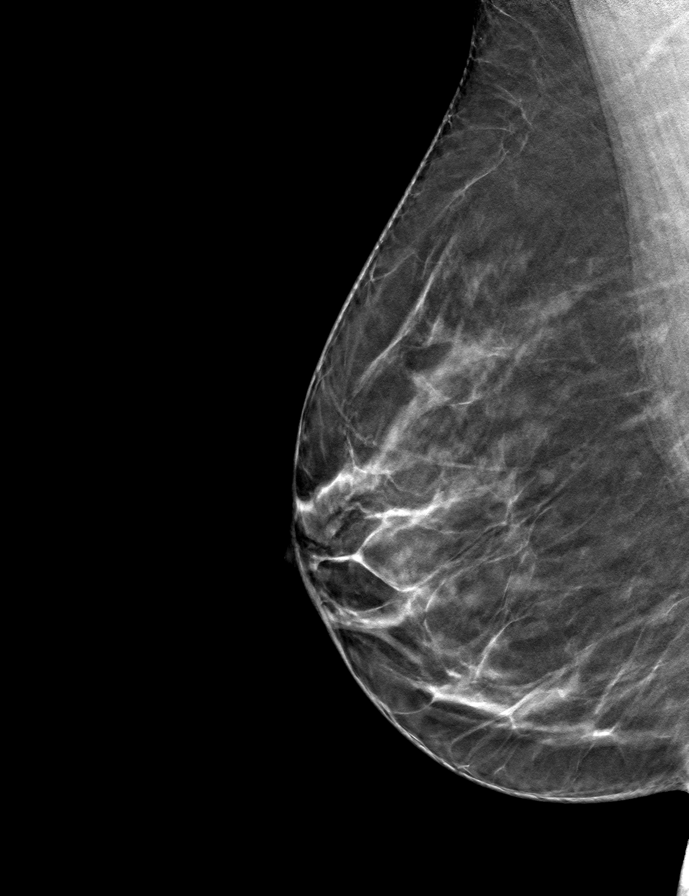

[L MLO tomo · tomo slice 31/60.0]
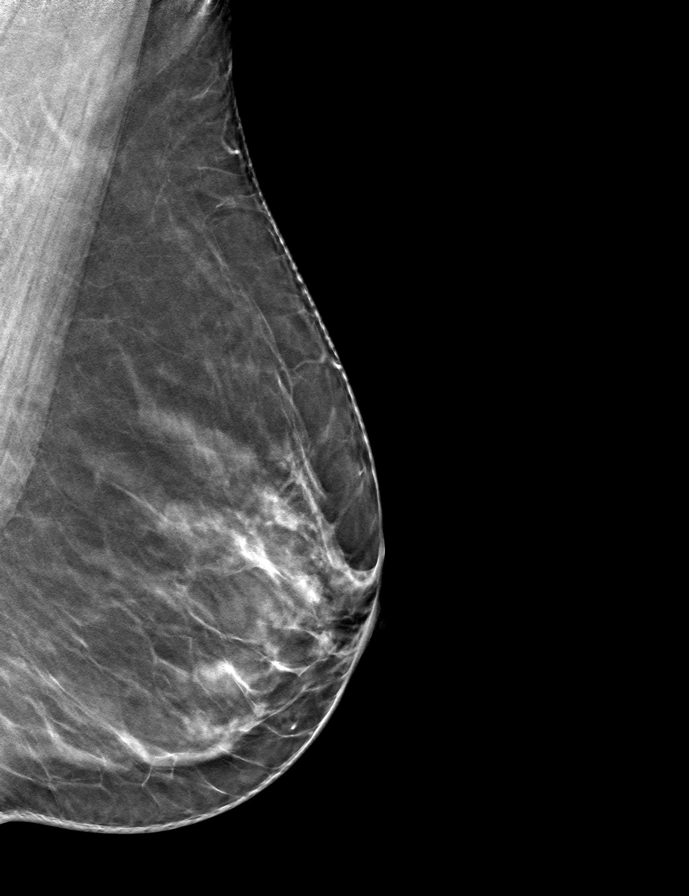

[R CC tomo · tomo slice 33/64.0]
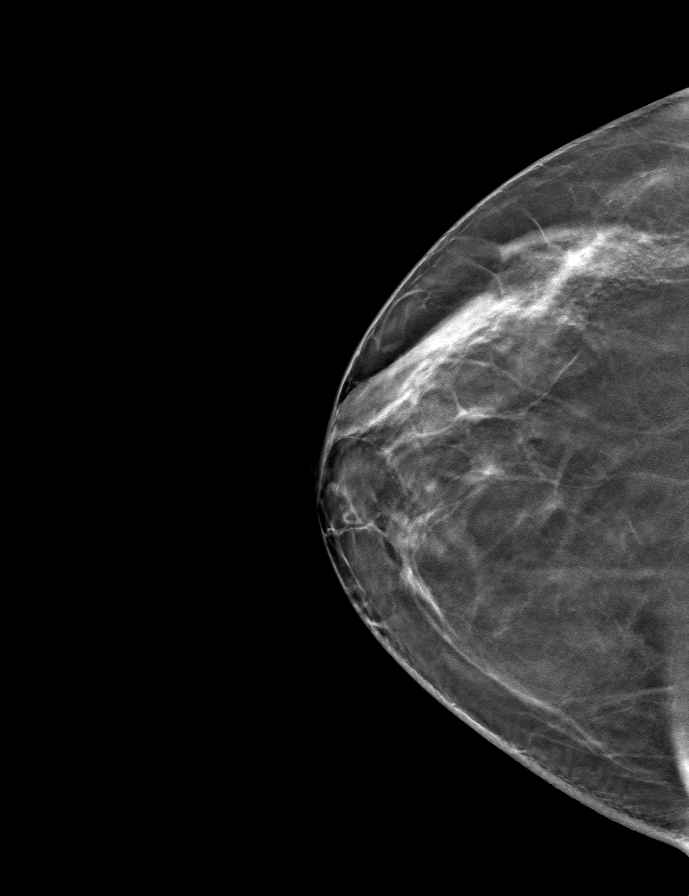

[L CC tomo · tomo slice 32/63.0]
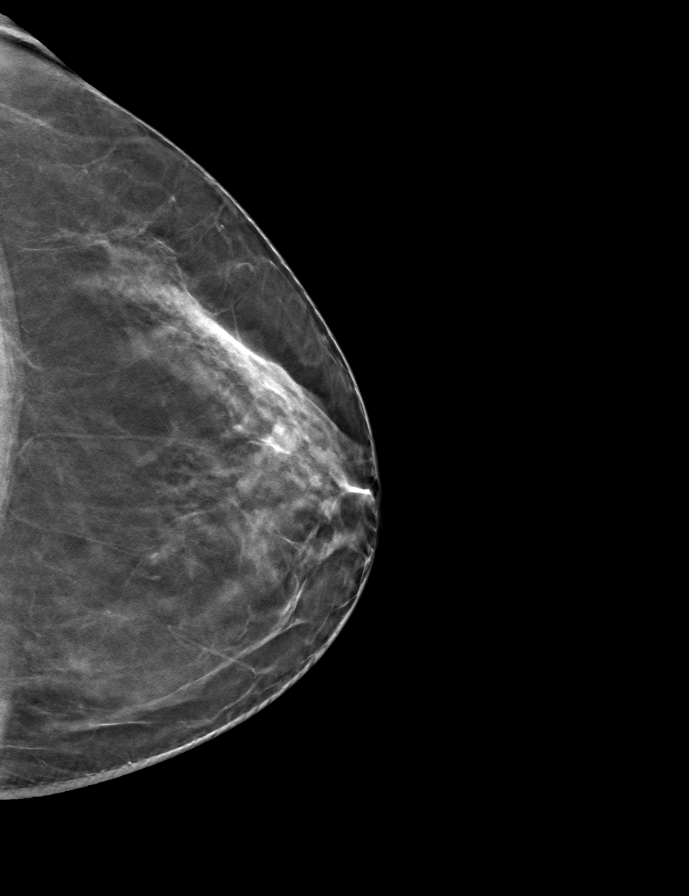

[9 of 24 positions shown; findings below may reference images not displayed]

ACR Breast Density Category b: There are scattered areas of
fibroglandular density.
FINDINGS: There are no findings suspicious for malignancy. Images were
processed with CAD.
IMPRESSION: No mammographic evidence of malignancy. A result letter of this
screening mammogram will be mailed directly to the patient.

RECOMMENDATION:
Screening mammogram in one year. (Code:[id number])

BI-RADS CATEGORY  1: Negative.

## 2022-03-14 ENCOUNTER — Other Ambulatory Visit (HOSPITAL_BASED_OUTPATIENT_CLINIC_OR_DEPARTMENT_OTHER): Payer: Self-pay | Admitting: Orthopaedic Surgery

## 2022-03-14 DIAGNOSIS — S83281D Other tear of lateral meniscus, current injury, right knee, subsequent encounter: Secondary | ICD-10-CM

## 2022-03-14 DIAGNOSIS — S83241D Other tear of medial meniscus, current injury, right knee, subsequent encounter: Secondary | ICD-10-CM

## 2022-03-15 NOTE — Telephone Encounter (Signed)
Prescription approved for short term. Further refills per her primary care doctor.

## 2022-04-01 ENCOUNTER — Other Ambulatory Visit (HOSPITAL_BASED_OUTPATIENT_CLINIC_OR_DEPARTMENT_OTHER): Payer: Self-pay | Admitting: Family Medicine

## 2022-04-01 DIAGNOSIS — I10 Essential (primary) hypertension: Secondary | ICD-10-CM

## 2022-04-01 DIAGNOSIS — E785 Hyperlipidemia, unspecified: Secondary | ICD-10-CM

## 2022-04-01 NOTE — Telephone Encounter (Signed)
Last refill: 01/05/2022    BP Readings from Last 3 Encounters:   02/11/22 124/82   03/03/21 128/84   01/08/21 120/80      Pulse Readings from Last 3 Encounters:   02/11/22 89   03/03/21 71   01/08/21 94     Lab Results   Component Value Date    NA 137 02/23/2021    K 4.2 02/23/2021    CR 0.63 02/23/2021    LDLC 90 10/28/2020    HDL 42 10/28/2020     Recent Visits  Date Type Provider Dept   02/11/22 Office Visit Lewkowitz, Elmarie Mainland, PA [facility]   Showing recent visits within past 365 days with a meds authorizing provider and meeting all other requirements  Future Appointments  Date Type Provider Dept   04/19/22 Appointment Lewkowitz, Elmarie Mainland, PA [facility]   Showing future appointments within next 180 days with a meds authorizing provider and meeting all other requirements    Cardiac Medications in Epic (for confirmation)     Heart Failure Medication               Lisinopril (PRINIVIL, ZESTRIL) 2.5 mg Tablet Take 1 tablet by mouth every day.           Hypertensive Medication               Lisinopril (PRINIVIL, ZESTRIL) 2.5 mg Tablet Take 1 tablet by mouth every day.           Lipid Medication               Simvastatin (ZOCOR) 10 mg Tablet TAKE 1 TABLET BY MOUTH EVERY DAY           Lab Results   Component Value Date    AST 18 02/23/2021    ALT 22 02/23/2021    CHOL 146 10/28/2020    LDLC 90 10/28/2020    LDLC 63 03/14/2020    HDL 42 10/28/2020    TRIG 69 10/28/2020      Lipid Medications in Epic (for confirmation)  Lipid Medication               Simvastatin (ZOCOR) 10 mg Tablet TAKE 1 TABLET BY MOUTH EVERY DAY

## 2022-04-13 ENCOUNTER — Ambulatory Visit: Attending: Family Medicine

## 2022-04-13 DIAGNOSIS — J302 Other seasonal allergic rhinitis: Secondary | ICD-10-CM | POA: Insufficient documentation

## 2022-04-13 DIAGNOSIS — E669 Obesity, unspecified: Secondary | ICD-10-CM | POA: Insufficient documentation

## 2022-04-13 DIAGNOSIS — E1169 Type 2 diabetes mellitus with other specified complication: Secondary | ICD-10-CM | POA: Insufficient documentation

## 2022-04-13 DIAGNOSIS — E785 Hyperlipidemia, unspecified: Secondary | ICD-10-CM | POA: Insufficient documentation

## 2022-04-13 LAB — LIPID PANEL
Cholesterol: 129 mg/dL (ref 112–200)
HDL Cholesterol: 32 mg/dL — ABNORMAL LOW (ref >=40)
LDL Cholesterol Calculation: 69 mg/dL (ref <100)
Non-HDL Cholesterol: 97 mg/dL (ref <=150.0)
Total Cholesterol: HDL Ratio: 4 mg/dL (ref 2.0–5.0)
Triglyceride: 139 mg/dL (ref 30–150)

## 2022-04-13 LAB — COMPREHENSIVE METABOLIC PANEL
Adjusted Calcium: 9.5 mg/dL (ref 8.7–10.2)
Alanine Transferase (ALT): 25 U/L (ref 4–56)
Alb/Glob Ratio: 1.1 (ref 1.0–1.6)
Albumin: 4 g/dL (ref 3.2–4.7)
Alkaline Phosphatase (ALP): 62 U/L (ref 38–126)
Aspartate Transaminase (AST): 24 U/L (ref 9–44)
BUN/ Creatinine: 29.1 — ABNORMAL HIGH (ref 7.3–21.7)
Bilirubin Total: 0.5 mg/dL (ref 0.1–2.2)
Calcium: 9.5 mg/dL (ref 8.7–10.2)
Carbon Dioxide Total: 26 mmol/L (ref 22–32)
Chloride: 104 mmol/L (ref 99–109)
Creatinine Serum: 0.79 mg/dL (ref 0.50–1.30)
E-GFR, Non-African American: 84 mL/min/{1.73_m2} (ref >60)
E-GFR: 97 mL/min/{1.73_m2} (ref >60)
Globulin: 3.6 g/dL (ref 2.2–4.2)
Glucose: 121 mg/dL — ABNORMAL HIGH (ref 70–99)
Potassium: 4.5 mmol/L (ref 3.5–5.2)
Protein: 7.6 g/dL (ref 5.9–8.2)
Sodium: 138 mmol/L (ref 134–143)
Urea Nitrogen, Blood (BUN): 23 mg/dL — ABNORMAL HIGH (ref 6–21)

## 2022-04-13 LAB — MICROALBUMIN
Creatinine Spot Urine: 101.2 mg/dL
Microalbumin Urine: 30 mg/L (ref <=30.0)
Microalbumin/Creatinine Ratio: 30 mg/g (ref <=30)

## 2022-04-13 LAB — CBC WITH DIFFERENTIAL
Basophils % Auto: 0.7 % (ref 0.0–1.0)
Basophils Abs Auto: 0.1 10*3/uL (ref 0.0–0.1)
Eosinophils % Auto: 6 % — ABNORMAL HIGH (ref 0.0–4.0)
Eosinophils Abs Auto: 0.4 10*3/uL — ABNORMAL HIGH (ref 0.0–0.2)
Hematocrit: 38.5 % (ref 36.0–48.0)
Hemoglobin: 13.4 g/dL (ref 12.0–16.0)
Immature Granulocytes % Auto: 0.3 % (ref 0.00–0.50)
Immature Granulocytes Abs Auto: 0 10*3/uL (ref 0.0–0.0)
Lymphocytes % Auto: 35.8 % (ref 5.0–41.0)
Lymphocytes Abs Auto: 2.5 10*3/uL (ref 1.3–2.9)
MCH: 30.6 pg (ref 27.0–34.0)
MCHC g/dL: 34.8 g/dL (ref 33.0–37.0)
MCV: 87.9 fL (ref 82.0–97.0)
MPV: 10.6 fL (ref 9.4–12.4)
Monocytes % Auto: 7.7 % (ref 0.0–10.0)
Monocytes Abs Auto: 0.5 10*3/uL (ref 0.3–0.8)
Neutrophils % Auto: 49.5 % (ref 45.0–75.0)
Neutrophils Abs Auto: 3.41 10*3/uL (ref 2.20–4.80)
Nucleated Cell Count: 0 10*3/uL (ref 0.0–0.1)
Nucleated RBC/100 WBC: 0 % WBC (ref <=0.0)
Platelet Count: 268 10*3/uL (ref 151–365)
RDW: 12.9 % (ref 11.5–14.5)
Red Blood Cell Count: 4.38 10*6/uL (ref 3.80–5.10)
White Blood Cell Count: 6.9 10*3/uL (ref 4.2–10.8)

## 2022-04-13 LAB — HEMOGLOBIN A1C
Hgb A1C,Glucose Est Avg: 151 mg/dL
Hgb A1C: 6.9 % — ABNORMAL HIGH (ref 4.0–5.6)

## 2022-04-13 NOTE — Progress Notes (Signed)
Will address at 7/17 appointment. -ARL

## 2022-04-19 ENCOUNTER — Ambulatory Visit (HOSPITAL_BASED_OUTPATIENT_CLINIC_OR_DEPARTMENT_OTHER): Admitting: Internal Medicine

## 2022-04-19 ENCOUNTER — Ambulatory Visit: Attending: Family Medicine

## 2022-04-19 ENCOUNTER — Encounter (HOSPITAL_BASED_OUTPATIENT_CLINIC_OR_DEPARTMENT_OTHER): Payer: Self-pay | Admitting: Internal Medicine

## 2022-04-19 VITALS — BP 130/70 | HR 86 | Temp 97.5°F | Ht 64.02 in | Wt 179.0 lb

## 2022-04-19 DIAGNOSIS — E785 Hyperlipidemia, unspecified: Secondary | ICD-10-CM

## 2022-04-19 DIAGNOSIS — M1711 Unilateral primary osteoarthritis, right knee: Secondary | ICD-10-CM

## 2022-04-19 DIAGNOSIS — I10 Essential (primary) hypertension: Secondary | ICD-10-CM

## 2022-04-19 DIAGNOSIS — Z23 Encounter for immunization: Secondary | ICD-10-CM

## 2022-04-19 DIAGNOSIS — E669 Obesity, unspecified: Secondary | ICD-10-CM

## 2022-04-19 DIAGNOSIS — Z124 Encounter for screening for malignant neoplasm of cervix: Secondary | ICD-10-CM

## 2022-04-19 DIAGNOSIS — E1149 Type 2 diabetes mellitus with other diabetic neurological complication: Secondary | ICD-10-CM

## 2022-04-19 DIAGNOSIS — G8929 Other chronic pain: Secondary | ICD-10-CM

## 2022-04-19 DIAGNOSIS — E1169 Type 2 diabetes mellitus with other specified complication: Secondary | ICD-10-CM

## 2022-04-19 DIAGNOSIS — Z0000 Encounter for general adult medical examination without abnormal findings: Secondary | ICD-10-CM

## 2022-04-19 LAB — SED RATE WESTERGREN: Sed Rate Westergren: 28 mm/hr — ABNORMAL HIGH (ref 0–20)

## 2022-04-19 LAB — URIC ACID, BLOOD: Uric Acid, Blood: 3.7 mg/dL (ref 2.6–8.7)

## 2022-04-19 LAB — C-REACTIVE PROTEIN: C-Reactive Protein: 0.4 mg/dL (ref <=1.0)

## 2022-04-19 MED ORDER — METFORMIN 1,000 MG TABLET
1000.0000 mg | ORAL_TABLET | Freq: Every day | ORAL | 1 refills | Status: DC
Start: 2022-04-19 — End: 2022-06-02

## 2022-04-19 MED ORDER — GABAPENTIN 300 MG CAPSULE
600.0000 mg | ORAL_CAPSULE | Freq: Every day | ORAL | 3 refills | Status: DC
Start: 2022-04-19 — End: 2022-06-02

## 2022-04-19 NOTE — Progress Notes (Signed)
Family Practice Clinic Visit     Date: 04/19/2022  Patient Name: Amanda Williamson    CHIEF COMPLAINT:   Chief Complaint   Patient presents with    Physical    Pap    Test Results     Patient would like to discuss previous lab work    Pain     Patient states she has pain all along the right side of her body. It starts in her legs and moves up to right hip, back, and shoulder          HISTORY OF PRESENT ILLNESS:    HPI  Patient returns for routine physical examination with Pap for cervical cancer screening.  She admits she has been in notable chronic pain since her last evaluation, which she has restarted gabapentin 300 mg nightly that was prescribed at her previous PCP for his diabetic neuropathy.  She admits that it provides minimal to moderate relief, however is most notable when she is not moving.  She does have a family history of rheumatoid arthritis affecting her brother and mother.  She becomes tearful recounting how her chronic pain causes notable mood changes, no SI/HI.  Comprehensive laboratory results were reviewed, which indicate controlled diabetes mellitus, dyslipidemia, however notable elevated BUN/creatinine ratio.  She states that she consumes copious amount of water daily, uncertain what else to do.    Review of Systems   Constitutional:  Positive for fatigue. Negative for activity change and appetite change.   HENT: Negative.     Respiratory:  Negative for shortness of breath.    Cardiovascular:  Negative for chest pain, palpitations and leg swelling.   Gastrointestinal:  Negative for abdominal pain, constipation, diarrhea, nausea and vomiting.   Endocrine: Negative for cold intolerance and heat intolerance.   Genitourinary: Negative.    Musculoskeletal:  Positive for arthralgias, back pain and myalgias. Negative for gait problem.   Skin: Negative.    Allergic/Immunologic: Positive for environmental allergies.   Neurological:  Positive for numbness.   Psychiatric/Behavioral:  Positive for dysphoric  mood. Negative for behavioral problems and sleep disturbance. The patient is nervous/anxious.        Note: This is a self-reported Review of Systems.    Vitals: BP 130/70 (SITE: left arm, Orthostatic Position: sitting, Cuff Size: regular)   Pulse 86   Temp 36.4 C (97.5 F) (Temporal)   Ht 1.626 m (5' 4.02")   Wt 81.2 kg (179 lb)   SpO2 95%   BMI 30.71 kg/m   No LMP recorded. Patient is perimenopausal.    Physical Exam  Exam conducted with a chaperone present.   Constitutional:       Appearance: Normal appearance.   Cardiovascular:      Rate and Rhythm: Normal rate and regular rhythm.      Heart sounds: Normal heart sounds.   Pulmonary:      Effort: No respiratory distress.      Breath sounds: Normal breath sounds.   Abdominal:      General: Bowel sounds are normal. There is no distension.      Palpations: Abdomen is soft.      Tenderness: There is no abdominal tenderness. There is no guarding.   Genitourinary:     Exam position: Supine.      Labia:         Right: No rash.         Left: No rash.       Vagina: Normal.  Cervix: Normal.      Uterus: Normal. Not deviated.       Adnexa: Right adnexa normal and left adnexa normal.      Rectum: Normal.   Skin:     General: Skin is warm and dry.   Neurological:      Mental Status: She is alert.   Psychiatric:         Attention and Perception: Attention normal.         Mood and Affect: Mood is depressed. Affect is tearful.         Behavior: Behavior is cooperative.          ASSESSMENT AND PLAN:    ICD-10-CM    1. Routine physical examination  Z00.00       2. Primary hypertension  I10       3. Diabetes mellitus type 2 in obese (HCC)  E11.69 Metformin (GLUCOPHAGE) 1,000 mg tablet    E66.9       4. Dyslipidemia  E78.5       5. Pap smear for cervical cancer screening  Z12.4 Pap Smear w/ High Risk HPV [facility])     Pap Smear w/ High Risk HPV [facility])      6. Other diabetic neurological complication associated with type 2 diabetes mellitus (HCC)  E11.49 Gabapentin (NEURONTIN)  300 mg Capsule      7. Other chronic pain  G89.29 Anti-Nuclear Ab Screen (ANA)     Sed Rate Westergren     C-Reactive Protein     Uric Acid, Blood      8. Osteoarthritis of right knee, unspecified osteoarthritis type  M17.11       9. Vaccine for streptococcus pneumoniae and influenza  Z23 PNEUMOCOCCAL CONJUGATE PCV20, PF (PREVNAR 20) VACCINE, IM         -HTN stable, no elevated BP readings or associated symptoms  -HLD stable, no muscle wasting or soreness  -DM stable, no elevated BS readings, compliant with all chronic medications  -Patient tolerated well woman examination well, will remain in my chart contact pertaining to results  -Refills for metformin 1000 mg tablet p.o. daily provided today  -Refills for gabapentin 300 mg nightly as needed provided, increase to 600 mg as needed  -Rheumatological labs to be performed in near future given patient's chronic pain and significant family history of rheumatoid arthritis  -Pending results, may consider referral to rheumatology  -Prevnar 20 IM administered by staff  -RTO in 1 month for chronic pain and DM follow-up, sooner if needed.      Return in about 1 month (around 05/20/2022) for chronic pain and DM follow up.    Progress notes reviewed and authenticated by Omelia Blackwater, PA-C 04/19/2022 10:27 AM    Note: This note was partially created using Dragon voice recognition software. Some transcription errors may have escaped proofreading.

## 2022-04-20 ENCOUNTER — Other Ambulatory Visit (HOSPITAL_BASED_OUTPATIENT_CLINIC_OR_DEPARTMENT_OTHER): Payer: Self-pay | Admitting: Orthopaedic Surgery

## 2022-04-20 DIAGNOSIS — S83241D Other tear of medial meniscus, current injury, right knee, subsequent encounter: Secondary | ICD-10-CM

## 2022-04-20 DIAGNOSIS — S83281D Other tear of lateral meniscus, current injury, right knee, subsequent encounter: Secondary | ICD-10-CM

## 2022-04-22 LAB — ANTI-NUCLEAR AB SCREEN (ANA): Antinuclear Ab: NEGATIVE

## 2022-04-25 LAB — PAP SMEAR W/ HIGH RISK HPV (MMC)
.: 0
HPV Aptima_Ext: NEGATIVE

## 2022-05-19 ENCOUNTER — Other Ambulatory Visit (HOSPITAL_BASED_OUTPATIENT_CLINIC_OR_DEPARTMENT_OTHER): Payer: Self-pay | Admitting: Internal Medicine

## 2022-05-19 DIAGNOSIS — J019 Acute sinusitis, unspecified: Secondary | ICD-10-CM

## 2022-05-19 NOTE — Telephone Encounter (Signed)
Last refill: 04/20/2022    BP Readings from Last 1 Encounters:   04/19/22 130/70      Pulse Readings from Last 1 Encounters:   04/19/22 86     Lab Results   Component Value Date    WBC 6.9 04/13/2022    HGB 13.4 04/13/2022    PLT 268 04/13/2022    NA 138 04/13/2022    K 4.5 04/13/2022    GLU 121 (H) 04/13/2022    CR 0.79 04/13/2022    ALT 25 04/13/2022     Recent Visits  Date Type Provider Dept   04/19/22 Office Visit Lewkowitz, Elmarie Mainland, [facility]   02/11/22 Office Visit Lewkowitz, Elmarie Mainland, [facility]   Showing recent visits within past 365 days with a meds authorizing provider and meeting all other requirements  Future Appointments  Date Type Provider Dept   06/02/22 Appointment Lewkowitz, Elmarie Mainland, [facility]   Showing future appointments within next 180 days with a meds authorizing provider and meeting all other requirements        CURES Audit Trail           User Date Status   HAMMOND, Ihor Austin 10/21/2020 12:14 PM  10/29/2020  4:54 PM  12/05/2020 12:43 PM Reviewed PDMP [1]  Reviewed PDMP [1]  Reviewed PDMP [1]               Current Medications  has a current medication list which includes the following prescription(s): acetaminophen, azelastine nasal, ferrous sulfate, fluticasone, gabapentin, lisinopril, magnesium, meloxicam, metformin, multivitamin, psyllium husk, simvastatin, and turmeric.

## 2022-05-20 ENCOUNTER — Ambulatory Visit (HOSPITAL_BASED_OUTPATIENT_CLINIC_OR_DEPARTMENT_OTHER): Admitting: Internal Medicine

## 2022-05-20 ENCOUNTER — Telehealth (HOSPITAL_BASED_OUTPATIENT_CLINIC_OR_DEPARTMENT_OTHER): Payer: Self-pay | Admitting: Orthopaedic Surgery

## 2022-05-20 DIAGNOSIS — S83281D Other tear of lateral meniscus, current injury, right knee, subsequent encounter: Secondary | ICD-10-CM

## 2022-05-20 DIAGNOSIS — S83241D Other tear of medial meniscus, current injury, right knee, subsequent encounter: Secondary | ICD-10-CM

## 2022-06-02 ENCOUNTER — Encounter (HOSPITAL_BASED_OUTPATIENT_CLINIC_OR_DEPARTMENT_OTHER): Payer: Self-pay | Admitting: Internal Medicine

## 2022-06-02 ENCOUNTER — Ambulatory Visit (HOSPITAL_BASED_OUTPATIENT_CLINIC_OR_DEPARTMENT_OTHER): Admitting: Internal Medicine

## 2022-06-02 VITALS — BP 130/80 | HR 90 | Temp 96.2°F | Ht 64.02 in | Wt 177.2 lb

## 2022-06-02 DIAGNOSIS — Z23 Encounter for immunization: Secondary | ICD-10-CM

## 2022-06-02 DIAGNOSIS — E785 Hyperlipidemia, unspecified: Secondary | ICD-10-CM

## 2022-06-02 DIAGNOSIS — E6609 Other obesity due to excess calories: Secondary | ICD-10-CM

## 2022-06-02 DIAGNOSIS — Z6830 Body mass index (BMI) 30.0-30.9, adult: Secondary | ICD-10-CM

## 2022-06-02 DIAGNOSIS — E669 Obesity, unspecified: Secondary | ICD-10-CM

## 2022-06-02 DIAGNOSIS — I10 Essential (primary) hypertension: Secondary | ICD-10-CM

## 2022-06-02 DIAGNOSIS — E1149 Type 2 diabetes mellitus with other diabetic neurological complication: Secondary | ICD-10-CM

## 2022-06-02 DIAGNOSIS — E1169 Type 2 diabetes mellitus with other specified complication: Secondary | ICD-10-CM

## 2022-06-02 MED ORDER — METFORMIN 1,000 MG TABLET
1000.0000 mg | ORAL_TABLET | Freq: Two times a day (BID) | ORAL | 1 refills | Status: DC
Start: 2022-06-02 — End: 2022-10-19

## 2022-06-02 MED ORDER — GABAPENTIN 300 MG CAPSULE
300.0000 mg | ORAL_CAPSULE | Freq: Three times a day (TID) | ORAL | 3 refills | Status: DC
Start: 2022-06-02 — End: 2022-11-02

## 2022-06-02 NOTE — Progress Notes (Signed)
Family Practice Clinic Visit     Date: 06/02/2022  Patient Name: Amanda Williamson    CHIEF COMPLAINT:   Chief Complaint   Patient presents with    Follow-up     1 month follow-up for chronic pain.    Her legs have been getting better. She feels more pain when standing still, it worse in the shower. She now has a chair to help           HISTORY OF PRESENT ILLNESS:    Pt notes marked improvement with DM neuropathy as she has been taking divided gabapentin 600 mg nightly. She admits pain notable when standing in the shower, which she has obtained a shower stool to help. She is inquiring if decreasing metformin to 1,000 mg once daily is feasible. She remains consistent with annual DM retinopathy eye exam, uncertain if due.    Review of Systems   Constitutional:  Positive for fatigue. Negative for activity change and appetite change.   HENT: Negative.     Respiratory:  Negative for shortness of breath.    Cardiovascular:  Negative for chest pain, palpitations and leg swelling.   Gastrointestinal:  Negative for abdominal pain, constipation, diarrhea, nausea and vomiting.   Endocrine: Negative for cold intolerance and heat intolerance.   Genitourinary: Negative.    Musculoskeletal:  Positive for arthralgias, back pain and myalgias (improving). Negative for gait problem.   Skin: Negative.    Allergic/Immunologic: Positive for environmental allergies.   Neurological:  Positive for numbness (improving).   Psychiatric/Behavioral:  Negative for behavioral problems, dysphoric mood and sleep disturbance. The patient is not nervous/anxious.        Note: This is a self-reported Review of Systems.    Vitals: BP 130/80 (SITE: left arm, Orthostatic Position: sitting, Cuff Size: regular)   Pulse 90   Temp (!) 35.7 C (96.2 F) (Temporal)   Ht 1.626 m (5' 4.02")   Wt 80.4 kg (177 lb 3.2 oz)   SpO2 98%   BMI 30.40 kg/m   No LMP recorded. Patient is perimenopausal.    Physical Exam  Constitutional:       Appearance: Normal  appearance. She is well-groomed. She is obese.   Cardiovascular:      Rate and Rhythm: Normal rate and regular rhythm.      Pulses: Normal pulses.      Heart sounds: Normal heart sounds.   Pulmonary:      Effort: Pulmonary effort is normal. No respiratory distress.      Breath sounds: Normal breath sounds. No wheezing.   Abdominal:      General: Bowel sounds are normal. There is no distension.      Palpations: Abdomen is soft.      Tenderness: There is no abdominal tenderness. There is no guarding.   Skin:     General: Skin is warm and dry.   Neurological:      Mental Status: She is alert.   Psychiatric:         Behavior: Behavior is cooperative.          ASSESSMENT AND PLAN:    ICD-10-CM    1. Diabetes mellitus type 2 in obese (HCC)  E11.69 Metformin (GLUCOPHAGE) 1,000 mg tablet    E66.9       2. Other diabetic neurological complication associated with type 2 diabetes mellitus (HCC)  E11.49 Gabapentin (NEURONTIN) 300 mg Capsule      3. Primary hypertension  I10  4. Dyslipidemia  E78.5       5. Need for shingles vaccine  Z23 ZOSTER RECOMBINANT (SHINGRIX) VACCINE, IM      6. Class 1 obesity due to excess calories with serious comorbidity and body mass index (BMI) of 30.0 to 30.9 in adult  E66.09     Z68.30          -HTN stable, no elevated BP readings or associated symptoms  -HLD stable, no muscle wasting or soreness  -DM stable, no elevated BS readings, compliant with all chronic medications  -INCREASE gabapentin to 900 mg divided nightly for improved symptoms of DM neuropathy  -If no improvement, may consider alternate medication such as Lyrica, however well tolerated at this time  -Lengthy discussion in regards to decreasing metformin, which pt was advised to work on developing consistent diet and exercise to address elevated BMI prior to decreasing DM medication doses  -Shingles IM administered by staff; pt amenable to return in 2-3 months for second inj  -RTO in 6 months for DM follow up, sooner if  needed    Return in about 5 months (around 11/02/2022).    Progress notes reviewed and authenticated by Omelia Blackwater, PA-C 06/02/2022 11:24 AM    Note: This note was partially created using Dragon voice recognition software. Some transcription errors may have escaped proofreading.

## 2022-06-17 ENCOUNTER — Other Ambulatory Visit (HOSPITAL_BASED_OUTPATIENT_CLINIC_OR_DEPARTMENT_OTHER): Payer: Self-pay | Admitting: Orthopaedic Surgery

## 2022-06-17 DIAGNOSIS — S83281D Other tear of lateral meniscus, current injury, right knee, subsequent encounter: Secondary | ICD-10-CM

## 2022-06-17 DIAGNOSIS — S83241D Other tear of medial meniscus, current injury, right knee, subsequent encounter: Secondary | ICD-10-CM

## 2022-06-21 ENCOUNTER — Encounter (HOSPITAL_BASED_OUTPATIENT_CLINIC_OR_DEPARTMENT_OTHER): Payer: Self-pay | Admitting: Adult Health

## 2022-06-21 ENCOUNTER — Ambulatory Visit (HOSPITAL_BASED_OUTPATIENT_CLINIC_OR_DEPARTMENT_OTHER): Admitting: Adult Health

## 2022-06-21 VITALS — BP 140/70 | HR 102 | Temp 97.1°F | Ht 64.02 in | Wt 175.2 lb

## 2022-06-21 MED ORDER — AMOXICILLIN 875 MG-POTASSIUM CLAVULANATE 125 MG TABLET
1.0000 | ORAL_TABLET | Freq: Two times a day (BID) | ORAL | 0 refills | Status: AC
Start: 2022-06-21 — End: 2022-07-01

## 2022-06-21 NOTE — Progress Notes (Signed)
HPI:  Amanda Williamson is a 56yr female here today for concerns of Cough and Sore Throat (Covid test negative )  .    She is a patient of Patterson Hammersmith, PA who I am meeting for the first time.  Medical history includes type 2 diabetes, hypertension, hyperlipidemia, reactive airway disease.    She is seen today for her sinus complaints.  Her symptoms started about 2 weeks ago when her grandkids became ill with similar symptoms.  Their symptoms resolved but unfortunately hers persist.  She did take a COVID test which was negative.  She describes continued frontal and maxillary sinus pressure, rhinorrhea, postnasal drainage, frontal sinus headache, fatigue, ear fullness and diminished hearing.  She has no taste.  She is having some cough but not the predominant symptom.  She denies chest congestion.  Denies shortness of breath, wheezing.    Has been treating with Astelin, DayQuil, NyQuil, sinus lavage without resolution.      Past Medical History:   Diagnosis Date    Diabetes ([place])     Erythema nodosum     H/O mammogram 09/10/2016    Kidney stones     PFS (patellofemoral syndrome)     Knee pain    PONV (postoperative nausea and vomiting)     RAD (reactive airway disease)      Past Surgical History:   Procedure Laterality Date    ------------OTHER------------- Right 02/2018    rotator cuff    COLONOSCOPY  07/08/2012    WNL    COLONOSCOPY  02/24/2015    ESOPHAGOGASTRODUODENOSCOPY (EGD)  02/24/2015    LIGATION, FALLOPIAN TUBE, BILATERAL, USING TUBAL RING  1994    PR ARTHRS KNEE W/MENISCECTOMY MED&LAT W/SHAVING Right 12/24/2020    PR KNEE SCOPE,SHAVE ARTICULAR CART Right 12/24/2020       Current Outpatient Medications:     Acetaminophen (TYLENOL) 500 mg Tablet, Take 1 tablet by mouth every 6 hours if needed for pain or Fever., Disp: , Rfl:     Amoxicillin-Clavulanate (AUGMENTIN) 875-125 mg Tablet, Take 1 tablet by mouth 2 times daily for 10 days., Disp: 20 tablet, Rfl: 0    Azelastine Nasal (ASTELIN) 137 mcg (0.1 %)  Spray, INSTILL ONE SPRAY INTO EACH NOSTRIL TWO TIMES DAILY., Disp: 90 mL, Rfl: 3    Ferrous Sulfate 325 mg (65 mg iron) DR Tablet, Take 1 tablet by mouth every morning., Disp: , Rfl:     Fluticasone (FLONASE) 50 mcg/actuation nasal spray, 1 spray each nostril twice daily (Patient taking differently: Instill 1 spray into EACH nostril two times daily if needed (allergies). 1 spray each nostril twice daily), Disp: 16 g, Rfl: 3    Gabapentin (NEURONTIN) 300 mg Capsule, Take 1 capsule by mouth 3 times daily., Disp: 270 capsule, Rfl: 3    Lisinopril (PRINIVIL, ZESTRIL) 2.5 mg Tablet, Take 1 tablet by mouth every day., Disp: 90 tablet, Rfl: 3    MAGNESIUM PO, Take 1 tablet by mouth every morning., Disp: , Rfl:     Meloxicam (MOBIC) 15 mg tablet, Take 1 tablet by mouth once daily after a meal., Disp: 30 tablet, Rfl: 0    Metformin (GLUCOPHAGE) 1,000 mg tablet, Take 1 tablet by mouth 2 times daily with meals., Disp: 180 tablet, Rfl: 1    MULTIVITAMIN PO, Take 1 tablet by mouth every morning. Twice a day, Disp: , Rfl:     Psyllium Husk 0.52 gram Capsule, Take 1 capsule by mouth every morning., Disp: , Rfl:  Simvastatin (ZOCOR) 10 mg Tablet, Take 1 tablet by mouth every day., Disp: 90 tablet, Rfl: 3    TURMERIC PO, Take 1 capsule by mouth every day at bedtime., Disp: , Rfl:   Allergies: No Known Allergies    REVIEW OF SYSTEMS:  Review of Systems   Constitutional:  Negative for chills and fever.       VITAL SIGNS:  Temp: 36.2 C (97.1 F) (09/18 1421)  Temp src: Temporal (09/18 1421)  Pulse: 102 (09/18 1421)  BP: 140/70 (09/18 1421)  Resp: --  SpO2: 98 % (09/18 1421)  Height: 162.6 cm (5' 4.02") (09/18 1421)  Weight: 79.5 kg (175 lb 3.2 oz) (09/18 1421)    PHYSICAL EXAM:  Physical Exam  Constitutional:       Comments: Well-appearing, nontoxic pleasant 56 year old female in no acute distress.   HENT:      Ears:      Comments: TM/ACs clear.       Nose:      Comments: Nasal congestion/nasally voice and rhinorrhea noted.      Mouth/Throat:      Comments: Oropharynx pink and moist without exudate.  No tonsillar enlargement or erythema.  Eyes:      Comments: PERRLA, sclera anicteric.   Neck:      Comments: Supple, nontender.  No cervical, supraclavicular LAD or tenderness.  Cardiovascular:      Comments: Rate and rhythm regular without MGR.  Pulmonary:      Comments: Clear to auscultation bilaterally without rales, rhonchi, wheezing.  Speaks in full sentences without respiratory effort.  Skin:     General: Skin is warm and dry.      Findings: No rash.   Psychiatric:         Mood and Affect: Mood normal.         Behavior: Behavior normal.         Thought Content: Thought content normal.         Judgment: Judgment normal.         ASSESSMENT/PLAN:  1. Acute non-recurrent sinusitis, unspecified location  Treat empirically with Augmentin given duration of symptoms.  This is apparently something she is taken in the past without adverse effect.  May also benefit from the addition of Sudafed for symptom relief.  She will follow-up here if symptoms persist or change.  - Amoxicillin-Clavulanate (AUGMENTIN) 875-125 mg Tablet; Take 1 tablet by mouth 2 times daily for 10 days.  Dispense: 20 tablet; Refill: 0      FOLLOW UP:  Return if symptoms worsen or fail to improve.    Progress notes authenticated by Lelan Pons, NP. Portions of this note were prepared with [employer]. Occasional phonetic and grammatical errors may have escaped proofreading.

## 2022-06-21 NOTE — Progress Notes (Signed)
reviewed

## 2022-06-21 NOTE — Progress Notes (Signed)
In the past 2 weeks have you tested positive for covid-19 or been diagnosed with covid-19? no  Over the past 2 weeks have you had unexplained or worsening symptoms of the following?     Onset of symptoms: 06/10/2022  Symptoms:  Headache? YES  Nausea? no  Vomiting? no  Diarrhea? no  Fever (<65 yrs, >100.0 ; > 65 yrs, >99.5)?  no  Highest Temp? 97.1 F, Current Temp (if taken)  Shortness of breath? none    Cough? YES  New loss of sense of smell/taste? No  Sore throat?  YES  Chills? no  Myalgia/Arthralgias? YES  Fatigue? YES  Pregnant? no  Congestion or runny nose? YES    Exposure:  In the last 2 weeks have you had contact with someone sick with covid-19?  no  In the last 2 weeks have you lived in a place where covid-19 is spreading? (Example, including but not limited too; board and care, nursing home, jail, prison, cramped living situation) no  Employed in a healthcare setting? no    Testing:  Previously tested for COVID-19? Yes, date: 06/11/2022 negative

## 2022-07-01 ENCOUNTER — Other Ambulatory Visit: Payer: Self-pay | Admitting: Primary Care

## 2022-07-01 DIAGNOSIS — R1084 Generalized abdominal pain: Secondary | ICD-10-CM

## 2022-07-02 ENCOUNTER — Other Ambulatory Visit: Payer: Self-pay | Admitting: Primary Care

## 2022-07-02 DIAGNOSIS — Z1231 Encounter for screening mammogram for malignant neoplasm of breast: Secondary | ICD-10-CM

## 2022-07-15 ENCOUNTER — Encounter (HOSPITAL_BASED_OUTPATIENT_CLINIC_OR_DEPARTMENT_OTHER): Payer: Self-pay | Admitting: Internal Medicine

## 2022-07-15 NOTE — Telephone Encounter (Signed)
From: Amanda Williamson  To: Sherilyn Banker, PA  Sent: 07/15/2022 12:36 PM PDT  Subject: Amanda Williamson     Hello Dr. Luellen Pucker I want to let you know that I haven't been feeling well lately. I've experienced dizziness and my legs are getting worse. I'm experiencing all the tingly, numbness, and heaviness on my legs. I am taking 3 gabapentin throughout the day sense August 30th when I last saw you. I feel like it's not helping me and I am concern. I wanted to reach out to let you know and to see if I should come see you or what is the next step maybe see a specialist?   Thank you

## 2022-08-11 ENCOUNTER — Ambulatory Visit (HOSPITAL_BASED_OUTPATIENT_CLINIC_OR_DEPARTMENT_OTHER): Admitting: PHYSICIAN ASSISTANT

## 2022-08-11 ENCOUNTER — Other Ambulatory Visit (HOSPITAL_BASED_OUTPATIENT_CLINIC_OR_DEPARTMENT_OTHER): Admitting: PHYSICIAN ASSISTANT

## 2022-08-11 ENCOUNTER — Encounter (HOSPITAL_BASED_OUTPATIENT_CLINIC_OR_DEPARTMENT_OTHER)

## 2022-08-11 ENCOUNTER — Encounter (HOSPITAL_BASED_OUTPATIENT_CLINIC_OR_DEPARTMENT_OTHER): Payer: Self-pay | Admitting: PHYSICIAN ASSISTANT

## 2022-08-11 VITALS — Temp 96.9°F | Ht 64.02 in | Wt 175.2 lb

## 2022-08-11 DIAGNOSIS — M25561 Pain in right knee: Secondary | ICD-10-CM

## 2022-08-11 NOTE — Progress Notes (Signed)
CHIEF COMPLAINT:  acute on chronic right knee pain    HISTORY OF PRESENT ILLNESS: Amanda Williamson is a pleasant 56 year old female who presents today with acute on chronic right knee pain.  Amanda Williamson has recently completed a right knee arthroscopy for lateral meniscus meniscectomy with Dr. Faylene Million in March 2022.  She reports since the surgery she has never felt 100%.  She notices intermittent swelling as well as persistent aching pain and "popping" in the knee.  She reports most of her pain on the lateral aspect still and is unable to kneel down on the floor without significant pain.  She reports her pain and symptoms have become so severe over the last few months that she is unable to help care for her 12 grandchildren.    PAST MEDICAL HISTORY:   Past Medical History:   Diagnosis Date    Diabetes (HCC)     Erythema nodosum     H/O mammogram 09/10/2016    Kidney stones     PFS (patellofemoral syndrome)     Knee pain    PONV (postoperative nausea and vomiting)     RAD (reactive airway disease)        MEDICATIONS:   Current Outpatient Medications:     Acetaminophen (TYLENOL) 500 mg Tablet, Take 1 tablet by mouth every 6 hours if needed for pain or Fever., Disp: , Rfl:     Azelastine Nasal (ASTELIN) 137 mcg (0.1 %) Spray, INSTILL ONE SPRAY INTO EACH NOSTRIL TWO TIMES DAILY., Disp: 90 mL, Rfl: 3    Ferrous Sulfate 325 mg (65 mg iron) DR Tablet, Take 1 tablet by mouth every morning., Disp: , Rfl:     Fluticasone (FLONASE) 50 mcg/actuation nasal spray, 1 spray each nostril twice daily (Patient taking differently: Instill 1 spray into EACH nostril two times daily if needed (allergies). 1 spray each nostril twice daily), Disp: 16 g, Rfl: 3    Gabapentin (NEURONTIN) 300 mg Capsule, Take 1 capsule by mouth 3 times daily., Disp: 270 capsule, Rfl: 3    Lisinopril (PRINIVIL, ZESTRIL) 2.5 mg Tablet, Take 1 tablet by mouth every day., Disp: 90 tablet, Rfl: 3    MAGNESIUM PO, Take 1 tablet by mouth every morning., Disp: , Rfl:     Meloxicam  (MOBIC) 15 mg tablet, Take 1 tablet by mouth once daily after a meal., Disp: 30 tablet, Rfl: 0    Metformin (GLUCOPHAGE) 1,000 mg tablet, Take 1 tablet by mouth 2 times daily with meals., Disp: 180 tablet, Rfl: 1    MULTIVITAMIN PO, Take 1 tablet by mouth every morning. Twice a day, Disp: , Rfl:     Psyllium Husk 0.52 gram Capsule, Take 1 capsule by mouth every morning., Disp: , Rfl:     Simvastatin (ZOCOR) 10 mg Tablet, Take 1 tablet by mouth every day., Disp: 90 tablet, Rfl: 3    TURMERIC PO, Take 1 capsule by mouth every day at bedtime., Disp: , Rfl:     ALLERGIES: Patient has no known allergies.    PAST SURGICAL HISTORY:   Past Surgical History:   Procedure Laterality Date    ------------OTHER------------- Right 02/2018    rotator cuff    COLONOSCOPY  07/08/2012    WNL    COLONOSCOPY  02/24/2015    ESOPHAGOGASTRODUODENOSCOPY (EGD)  02/24/2015    LIGATION, FALLOPIAN TUBE, BILATERAL, USING TUBAL RING  1994    PR ARTHRS KNEE W/MENISCECTOMY MED&LAT W/SHAVING Right 12/24/2020    PR KNEE SCOPE,SHAVE ARTICULAR CART Right 12/24/2020  SOCIAL HISTORY:   Social History     Socioeconomic History    Marital status: SINGLE     Spouse name: Not on file    Number of children: Not on file    Years of education: Not on file    Highest education level: Not on file   Occupational History    Not on file   Tobacco Use    Smoking status: Never    Smokeless tobacco: Never   Substance and Sexual Activity    Alcohol use: No    Drug use: No    Sexual activity: Not on file   Other Topics Concern    Not on file   Social History Narrative    Not on file     Social Determinants of Health     Financial Resource Strain: Not on file   Food Insecurity: Not on file   Transportation Needs: Not on file   Physical Activity: Not on file   Stress: Not on file   Social Connections: Not on file   Intimate Partner Violence: Not on file   Housing Stability: Not on file       FAMILY HISTORY:   Family History   Problem Relation Name Age of Onset     Arthritis Mother          Rheumitoid    Lipids Mother      Diabetes Father      Other (Other) Father          Kidney faliure    No Known Problems Sister      No Known Problems Brother      No Known Problems Brother      No Known Problems Brother      No Known Problems Daughter      No Known Problems Daughter      No Known Problems Daughter      Other (Other) Son          pulmonary    Diabetes Son          childhood       ROS: A 14 point review of systems is reviewed and is negative with the exception of what is listed in the HPI.    Vitals:    08/11/22 1233   Temp: 36.1 C (96.9 F)   TempSrc: Temporal   Weight: 79.5 kg (175 lb 3.2 oz)   Height: 1.626 m (5' 4.02")     Body mass index is 30.05 kg/m.    PHYSICAL EXAMINATION  GENERAL: Well appearing, in no apparent distress.  PSYCHOLOGICAL: Alert and oriented x3, mood and affect appropriate for visit.  EYES: Extraocular movements are intact, no scleral icterus.  NEUROLOGICAL: CN II-XII grossly intact.  SKIN: Skin overlying the injured/operative extremity shows no signs of skin changes or rashes.  RESPIRATORY: Normal respiratory effort with no audible wheezing.  CARDIOVASCULAR: The patient has a 2+ radial pulse, symmetric bilaterally.  MUSCULOSKELETAL:    Right Knee:  No effusion or erythema, no ecchymosis present, no significant swelling  ROM: 0/0/120  Strength: 5/5 flexion, 5/5 extension  + Patellar Grind  (-) Varus/valgus  + McMurrays  (-) Lachmans  Nvi Distally      DIAGNOSTIC IMAGING: 3 views of the right knee were obtained today which show joint space narrowing throughout the lateral and medial compartments as well as the patellofemoral compartment.  There is evidence of subchondral sclerosis and osteophyte formation.    IMPRESSION: Based on the patient's physical  exam, complaints and recent imaging studies we suspect her pain and symptoms may be due to a possible reinjury of the lateral meniscus.  She is positive with McMurray's testing on exam today and as  result we recommend she complete an MRI of the right knee for further evaluation.  Based on the results we would recommend a another right knee arthroscopy to repair the damage.    PLAN: Follow-up after MRI.    [facility] PA-C  [facility]  [address] [place], [place]. [address] [place], [place] [address]  [phone]      This note was generated using Programmer, multimedia.  The best effort was made to avoid grammatical errors, but they still may exist.

## 2022-08-12 ENCOUNTER — Other Ambulatory Visit (HOSPITAL_BASED_OUTPATIENT_CLINIC_OR_DEPARTMENT_OTHER): Payer: Self-pay | Admitting: Internal Medicine

## 2022-08-12 DIAGNOSIS — Z23 Encounter for immunization: Secondary | ICD-10-CM

## 2022-09-14 ENCOUNTER — Telehealth (HOSPITAL_BASED_OUTPATIENT_CLINIC_OR_DEPARTMENT_OTHER): Payer: Self-pay | Admitting: PHYSICIAN ASSISTANT

## 2022-09-14 DIAGNOSIS — S83281A Other tear of lateral meniscus, current injury, right knee, initial encounter: Secondary | ICD-10-CM

## 2022-09-14 DIAGNOSIS — S83241D Other tear of medial meniscus, current injury, right knee, subsequent encounter: Secondary | ICD-10-CM

## 2022-09-14 DIAGNOSIS — S83281D Other tear of lateral meniscus, current injury, right knee, subsequent encounter: Secondary | ICD-10-CM

## 2022-09-14 NOTE — Telephone Encounter (Signed)
Patient calling to have referral redirected for MRI    She need an open MRI    She is clausterphobic      Please advise

## 2022-09-14 NOTE — Addendum Note (Signed)
Addended by: Leroy Sea on: 09/14/2022 10:34 AM     Modules accepted: Orders

## 2022-09-15 NOTE — Telephone Encounter (Signed)
Patient notified

## 2022-10-05 MED ORDER — DIAZEPAM 5 MG TABLET
ORAL_TABLET | ORAL | 0 refills | Status: AC
Start: 2022-10-05 — End: 2022-11-05

## 2022-10-05 NOTE — Addendum Note (Signed)
Addended by: Devota Pace on: 10/05/2022 09:23 AM     Modules accepted: Orders

## 2022-10-05 NOTE — Telephone Encounter (Signed)
Patient called back to get medication for MRI scheduled for Saturday Jan 6. Pharmacy is [facility] [place]. Thank you.

## 2022-10-05 NOTE — Addendum Note (Signed)
Addended by: Johnnye Sima on: 10/05/2022 09:38 AM     Modules accepted: Orders

## 2022-10-11 DIAGNOSIS — Z029 Encounter for administrative examinations, unspecified: Secondary | ICD-10-CM | POA: Insufficient documentation

## 2022-10-12 ENCOUNTER — Ambulatory Visit (HOSPITAL_BASED_OUTPATIENT_CLINIC_OR_DEPARTMENT_OTHER): Admitting: PHYSICIAN ASSISTANT

## 2022-10-12 ENCOUNTER — Encounter (HOSPITAL_BASED_OUTPATIENT_CLINIC_OR_DEPARTMENT_OTHER): Payer: Self-pay | Admitting: PHYSICIAN ASSISTANT

## 2022-10-12 VITALS — Temp 95.5°F | Ht 64.02 in | Wt 175.2 lb

## 2022-10-12 NOTE — Progress Notes (Addendum)
 CHIEF COMPLAINT: Chronic right knee pain    HISTORY OF PRESENT ILLNESS: Amanda Williamson is a pleasant 57 year old female presents today regarding acute on chronic right knee pain.  She has recently completed a right knee arthroscopy about 1 year ago with Dr. Faylene Million for a meniscectomy.  She reports she had a slow recovery after the surgery but overall did feel much better.  Unfortunately over the last few months she has noted increased pain and popping within the joint.  She notes that her physical activity has significantly declined due to her pain.  She has recently completed an MRI of her right knee which is later reviewed in this note.  She is continue with topical and oral anti-inflammatories as well as a knee brace and rest which only give her temporary minimal improvements.    PAST MEDICAL HISTORY:   Past Medical History:   Diagnosis Date    Diabetes (HCC)     Erythema nodosum     H/O mammogram 09/10/2016    Kidney stones     PFS (patellofemoral syndrome)     Knee pain    PONV (postoperative nausea and vomiting)     RAD (reactive airway disease)        MEDICATIONS:   Current Outpatient Medications:     Acetaminophen (TYLENOL) 500 mg Tablet, Take 1 tablet by mouth every 6 hours if needed for pain or Fever., Disp: , Rfl:     Azelastine Nasal (ASTELIN) 137 mcg (0.1 %) Spray, INSTILL ONE SPRAY INTO EACH NOSTRIL TWO TIMES DAILY., Disp: 90 mL, Rfl: 3    Diazepam (VALIUM) 5 mg Tablet, Take one table by mouth one hour prior to MRI study for anxiety/claustrophobia., Disp: 1 tablet, Rfl: 0    Ferrous Sulfate 325 mg (65 mg iron) DR Tablet, Take 1 tablet by mouth every morning., Disp: , Rfl:     Fluticasone (FLONASE) 50 mcg/actuation nasal spray, 1 spray each nostril twice daily (Patient taking differently: Instill 1 spray into EACH nostril two times daily if needed (allergies). 1 spray each nostril twice daily), Disp: 16 g, Rfl: 3    Gabapentin (NEURONTIN) 300 mg Capsule, Take 1 capsule by mouth 3 times daily., Disp: 270  capsule, Rfl: 3    Lisinopril (PRINIVIL, ZESTRIL) 2.5 mg Tablet, Take 1 tablet by mouth every day., Disp: 90 tablet, Rfl: 3    MAGNESIUM PO, Take 1 tablet by mouth every morning., Disp: , Rfl:     Meloxicam (MOBIC) 15 mg tablet, Take 1 tablet by mouth once daily after a meal., Disp: 30 tablet, Rfl: 0    Metformin (GLUCOPHAGE) 1,000 mg tablet, Take 1 tablet by mouth 2 times daily with meals., Disp: 180 tablet, Rfl: 1    MULTIVITAMIN PO, Take 1 tablet by mouth every morning. Twice a day, Disp: , Rfl:     Psyllium Husk 0.52 gram Capsule, Take 1 capsule by mouth every morning., Disp: , Rfl:     Simvastatin (ZOCOR) 10 mg Tablet, Take 1 tablet by mouth every day., Disp: 90 tablet, Rfl: 3    TURMERIC PO, Take 1 capsule by mouth every day at bedtime., Disp: , Rfl:     ALLERGIES: Patient has no known allergies.    PAST SURGICAL HISTORY:   Past Surgical History:   Procedure Laterality Date    ------------OTHER------------- Right 02/2018    rotator cuff    COLONOSCOPY  07/08/2012    WNL    COLONOSCOPY  02/24/2015    ESOPHAGOGASTRODUODENOSCOPY (EGD)  02/24/2015  LIGATION, FALLOPIAN TUBE, BILATERAL, USING TUBAL RING  1994    PR ARTHRS KNEE W/MENISCECTOMY MED&LAT W/SHAVING Right 12/24/2020    PR KNEE SCOPE,SHAVE ARTICULAR CART Right 12/24/2020       SOCIAL HISTORY:   Social History     Socioeconomic History    Marital status: SINGLE     Spouse name: Not on file    Number of children: Not on file    Years of education: Not on file    Highest education level: Not on file   Occupational History    Not on file   Tobacco Use    Smoking status: Never    Smokeless tobacco: Never   Substance and Sexual Activity    Alcohol use: No    Drug use: No    Sexual activity: Not on file   Other Topics Concern    Not on file   Social History Narrative    Not on file     Social Determinants of Health     Financial Resource Strain: Not on file   Food Insecurity: Not on file   Transportation Needs: Not on file   Physical Activity: Not on file    Stress: Not on file   Social Connections: Not on file   Intimate Partner Violence: Not on file   Housing Stability: Not on file       FAMILY HISTORY:   Family History   Problem Relation Name Age of Onset    Arthritis Mother          Rheumitoid    Lipids Mother      Diabetes Father      Other (Other) Father          Kidney faliure    No Known Problems Sister      No Known Problems Brother      No Known Problems Brother      No Known Problems Brother      No Known Problems Daughter      No Known Problems Daughter      No Known Problems Daughter      Other (Other) Son          pulmonary    Diabetes Son          childhood       ROS: A 14 point review of systems is reviewed and is negative with the exception of what is listed in the HPI.    Vitals:    10/12/22 1117   Temp: (!) 35.3 C (95.5 F)   TempSrc: Temporal   Weight: 79.5 kg (175 lb 3.2 oz)   Height: 1.626 m (5' 4.02")     Body mass index is 30.05 kg/m.    PHYSICAL EXAMINATION  GENERAL: Well appearing, in no apparent distress.  PSYCHOLOGICAL: Alert and oriented x3, mood and affect appropriate for visit.  EYES: Extraocular movements are intact, no scleral icterus.  NEUROLOGICAL: CN II-XII grossly intact.  SKIN: Skin overlying the injured/operative extremity shows no signs of skin changes or rashes.  RESPIRATORY: Normal respiratory effort with no audible wheezing.  CARDIOVASCULAR: The patient has a 2+ radial pulse, symmetric bilaterally.  MUSCULOSKELETAL:    Right Knee:  No effusion or erythema, no ecchymosis present, no significant swelling  ROM: 0/0/120  Strength: 5/5 flexion, 5/5 extension  + Patellar Grind  (-) Varus/valgus  + McMurrays  (-) Lachmans  Nvi Distally    DIAGNOSTIC IMAGING:   MRI OF RIGHT KNEE - 10/09/2022  FINDINGS:  Medial compartment: No full-thickness chondral defect or marrow edema medial compartment.  Blunted inner free margin posterior horn medial meniscus.  Mild fraying of the inner free margin body segment.  Anterior horn medial meniscus  is intact.  Lateral compartment: Grade 2/3 chondral fissuring weightbearing surface lateral femoral condyle without subchondral marrow edema.  Frayed inner free margin body segment lateral meniscus.  Blunted inner free margin posterior horn.  Anterior horn lateral meniscus is intact.  Cruciate and collateral ligaments: Anterior cruciate ligament is intact.  Posterior cruciate ligament is intact.  Medial bowing of the medial collateral ligament without tear.  Fibular collateral ligament, iliotibial band and biceps for Morris tendons are intact.  Patellofemoral compartment and extensor mechanism: High-grade loss of articular cartilage patellofemoral compartment with approximately 1 cm AP by 1.1 cm transverse by 1.1 cm CC subchondral fibrocystic change lateral trochlear surface and osseous spurring at the joint margins.  Mild distal quadriceps and patellar tendinosis.  Joint space/popliteal fossa/posterior calf: Small joint effusion with minimal fluid in the popliteal bursa.    Impression:  1.  Severe patellofemoral joint osteoarthritis.  Grade 2/3 chondral fissuring weightbearing surface lateral femoral condyle without subchondral marrow edema stable since earlier study.  No acute fracture.  2.  Blunted inner free margin posterior horn medial meniscus.  Mild fraying of the inner free margin body segment.  Anterior horn medial meniscus is intact.  Frayed inner free margin body segment lateral meniscus.  Blunted inner free margin posterior horn.  Anterior horn lateral meniscus is intact.    3. Anterior cruciate ligament is intact.  Posterior cruciate ligament is intact.  4.  Medial bowing of the medial collateral ligament without tear.  Lateral collateral ligamentous complex is intact.  5.  Mild distal quadriceps and patellar tendinosis.  Small joint effusion.    IMPRESSION: Based on the patient's physical exam, complaints and recent imaging studies we suspect the patient's pain and symptoms may be due to another torn  medial and possibly lateral meniscus of the right knee.  We recommend a surgical consultation again with Dr. Faylene Million to discuss a right knee arthroscopy versus TKA.  We do recommend she continue with her physical therapy exercises and we will send a new referral to [facility] with Cala Bradford and [facility]  She is amenable to this plan will follow-up with Dr. Faylene Million soon as possible.    PLAN: Follow-up with Dr. Faylene Million to discuss right knee arthroscopy versus TKA.    [facility] PA-C  [facility]  [address] [place], [place]. [address] [place], [place] [address]  [phone]      This note was generated using Programmer, multimedia.  The best effort was made to avoid grammatical errors, but they still may exist.

## 2022-10-19 ENCOUNTER — Other Ambulatory Visit (HOSPITAL_BASED_OUTPATIENT_CLINIC_OR_DEPARTMENT_OTHER): Payer: Self-pay | Admitting: Internal Medicine

## 2022-10-19 DIAGNOSIS — E1169 Type 2 diabetes mellitus with other specified complication: Secondary | ICD-10-CM

## 2022-10-19 NOTE — Telephone Encounter (Signed)
Last refill: 08/29/2022     Lab Results   Component Value Date    HGBA1C 6.9 (H) 04/13/2022    HGBA1C 7.1 (H) 02/23/2021    HGBA1C 7.0 (H) 10/28/2020     Recent Visits  Date Type Provider Dept   06/21/22 Office Visit Teodora Medici, [facility]   06/02/22 Office Visit Lewkowitz, Danford Bad, [place] [facility]   04/19/22 Office Visit Holley Bouche, [place] [facility]   02/11/22 Office Visit Lewkowitz, Danford Bad, [facility]   Showing recent visits within past 365 days with a meds authorizing provider and meeting all other requirements  Future Appointments  Date Type Provider Dept   11/02/22 Appointment Lewkowitz, Danford Bad, [facility]   Showing future appointments within next 180 days with a meds authorizing provider and meeting all other requirements     DM Medications in Epic (for confirmation)  Diabetes Medication               Metformin (GLUCOPHAGE) 1,000 mg tablet Take 1 tablet by mouth 2 times daily with meals.

## 2022-10-21 ENCOUNTER — Encounter (HOSPITAL_BASED_OUTPATIENT_CLINIC_OR_DEPARTMENT_OTHER): Payer: Self-pay | Admitting: Internal Medicine

## 2022-10-21 DIAGNOSIS — E1169 Type 2 diabetes mellitus with other specified complication: Secondary | ICD-10-CM

## 2022-10-21 NOTE — Telephone Encounter (Signed)
From: Eyvonne Left  To: Sherilyn Banker, PA  Sent: 10/21/2022 7:50 AM PST  Subject: Labs    Amanda Williamson. Do I need labs before I come see you later this month?

## 2022-10-29 ENCOUNTER — Ambulatory Visit: Attending: Family Medicine

## 2022-10-29 LAB — COMPREHENSIVE METABOLIC PANEL
Adjusted Calcium: 9.5 mg/dL (ref 8.7–10.2)
Alanine Transferase (ALT): 17 U/L (ref 4–56)
Alb/Glob Ratio: 1 (ref 1.0–1.6)
Albumin: 4 g/dL (ref 3.2–4.7)
Alkaline Phosphatase (ALP): 64 U/L (ref 38–126)
Aspartate Transaminase (AST): 20 U/L (ref 9–44)
BUN/ Creatinine: 23.5 — ABNORMAL HIGH (ref 7.3–21.7)
Bilirubin Total: 0.5 mg/dL (ref 0.1–2.2)
Calcium: 9.5 mg/dL (ref 8.7–10.2)
Carbon Dioxide Total: 26 mmol/L (ref 22–32)
Chloride: 101 mmol/L (ref 99–109)
Creatinine Serum: 0.68 mg/dL (ref 0.50–1.30)
Globulin: 4 g/dL (ref 2.2–4.2)
Glucose: 109 mg/dL — ABNORMAL HIGH (ref 70–99)
Potassium: 4.2 mmol/L (ref 3.5–5.2)
Protein: 8 g/dL (ref 5.9–8.2)
Sodium: 136 mmol/L (ref 134–143)
Urea Nitrogen, Blood (BUN): 16 mg/dL (ref 6–21)
eGFR Creatinine (Female): 102 mL/min/{1.73_m2} (ref >60)

## 2022-10-29 LAB — HEMOGLOBIN A1C
Hgb A1C,Glucose Est Avg: 148 mg/dL
Hgb A1C: 6.8 % — ABNORMAL HIGH (ref 4.0–5.6)

## 2022-10-29 NOTE — Progress Notes (Signed)
Will discuss at 01/30 appointment; okay to close -[facility]

## 2022-11-02 ENCOUNTER — Encounter (HOSPITAL_BASED_OUTPATIENT_CLINIC_OR_DEPARTMENT_OTHER): Payer: Self-pay | Admitting: Internal Medicine

## 2022-11-02 ENCOUNTER — Ambulatory Visit (HOSPITAL_BASED_OUTPATIENT_CLINIC_OR_DEPARTMENT_OTHER): Admitting: Internal Medicine

## 2022-11-02 VITALS — BP 130/74 | HR 82 | Temp 96.9°F | Ht 64.02 in | Wt 176.0 lb

## 2022-11-02 MED ORDER — GABAPENTIN 400 MG CAPSULE
400.0000 mg | ORAL_CAPSULE | Freq: Three times a day (TID) | ORAL | 3 refills | Status: DC
Start: 2022-11-02 — End: 2023-03-11

## 2022-11-02 MED ORDER — SERTRALINE 50 MG TABLET
50.0000 mg | ORAL_TABLET | Freq: Every day | ORAL | 0 refills | Status: DC
Start: 2022-11-02 — End: 2023-03-04

## 2022-11-02 MED ORDER — METFORMIN 1,000 MG TABLET
1000.0000 mg | ORAL_TABLET | Freq: Two times a day (BID) | ORAL | 3 refills | Status: DC
Start: 2022-11-02 — End: 2023-11-11

## 2022-11-02 NOTE — Progress Notes (Signed)
Family Practice Clinic Visit     Date: 11/02/2022  Patient Name: Amanda Williamson    CHIEF COMPLAINT:   Chief Complaint   Patient presents with    Diabetes     Patient states she is doing good at home    Anxiety     Patient states her anxiety is getting worse over the past couple of months    Toe Problem     Patient states her toes are numb and at night they are very stiff.     Hand Pain     Patient states her hands have been throbbing for months.           HISTORY OF PRESENT ILLNESS:    Diabetes  Hypoglycemia symptoms include nervousness/anxiousness. Associated symptoms include fatigue. Pertinent negatives for diabetes include no chest pain.   Anxiety  Symptoms include nervous/anxious behavior. Patient reports no chest pain, nausea, palpitations or shortness of breath.       Pt continues to experience neuropathic pain in bilateral lower extremities; uncertain if additional medication indicated or uncontrolled DM current. She admits to having notable anxiety over the last 2-3 months. She has experienced anxiety historically, however did not want to initiate medication as she believed she could cope with it. She denies any specific triggers, admits family stressors contributory.    Review of Systems   Constitutional:  Positive for fatigue. Negative for activity change and appetite change.   HENT: Negative.     Respiratory:  Negative for shortness of breath.    Cardiovascular:  Negative for chest pain, palpitations and leg swelling.   Gastrointestinal:  Negative for abdominal pain, constipation, diarrhea, nausea and vomiting.   Endocrine: Negative for cold intolerance and heat intolerance.   Genitourinary: Negative.    Musculoskeletal:  Positive for arthralgias, back pain and myalgias (improving). Negative for gait problem.   Skin: Negative.    Allergic/Immunologic: Positive for environmental allergies.   Neurological:  Positive for numbness (improving).   Psychiatric/Behavioral:  Negative for behavioral problems,  dysphoric mood and sleep disturbance. The patient is nervous/anxious.        Note: This is a self-reported Review of Systems.    Vitals: BP 130/74 (SITE: left arm, Orthostatic Position: sitting, Cuff Size: regular)   Pulse 82   Temp 36.1 C (96.9 F) (Temporal)   Ht 1.626 m (5' 4.02")   Wt 79.8 kg (176 lb)   LMP 07/24/2016   SpO2 97%   BMI 30.20 kg/m   Patient's last menstrual period was 07/24/2016.    Physical Exam  Constitutional:       Appearance: Normal appearance. She is well-groomed. She is obese.   Cardiovascular:      Rate and Rhythm: Normal rate and regular rhythm.      Pulses: Normal pulses.      Heart sounds: Normal heart sounds.   Pulmonary:      Effort: Pulmonary effort is normal. No respiratory distress.      Breath sounds: Normal breath sounds. No wheezing.   Abdominal:      General: Bowel sounds are normal. There is no distension.      Palpations: Abdomen is soft.      Tenderness: There is no abdominal tenderness. There is no guarding.   Skin:     General: Skin is warm and dry.   Neurological:      Mental Status: She is alert.   Psychiatric:         Attention and Perception: Attention normal.  Mood and Affect: Mood is anxious and depressed. Affect is tearful.         Speech: Speech normal.         Behavior: Behavior normal. Behavior is cooperative.          ASSESSMENT AND PLAN:    ICD-10-CM    1. Diabetes mellitus type 2 in obese (HCC)  E11.69 Metformin (GLUCOPHAGE) 1,000 mg tablet    E66.9       2. Other diabetic neurological complication associated with type 2 diabetes mellitus (HCC)  E11.49 Gabapentin (NEURONTIN) 400 mg Capsule      3. Primary hypertension  I10       4. Dyslipidemia  E78.5       5. Polyneuropathy associated with underlying disease ([place])  G63       6. GAD (generalized anxiety disorder)  F41.1 Sertraline (ZOLOFT) 50 mg Tablet      7. Need for shingles vaccine  Z23 ZOSTER RECOMBINANT (SHINGRIX) VACCINE, IM     ZOSTER RECOMBINANT (SHINGRIX) VACCINE, IM      8. Class 1  obesity with body mass index (BMI) of 30.0 to 30.9 in adult, unspecified obesity type, unspecified whether serious comorbidity present  E66.9     Z68.30          -HTN stable, no elevated BP readings or associated symptoms  -HLD stable, no muscle wasting or soreness  -DM stable, no elevated BS readings, compliant with all chronic medications,  -START Zoloft 50 mg po qAM for anxiety and disorder  -Given length and history of untreated GAD, anticipate will require increase in dose; adverse effects, alarming symptoms and ED precautions reviewed in reference to development of SI/HI  -Shingrix IM 1/2 administered by staff  -RTO in 1 month for mood medication follow up, sooner if needed    Return in about 1 month (around 12/02/2022) for medication follow up .    Progress notes reviewed and authenticated by Sherilyn Banker, PA-C 11/02/2022 11:31 AM    Note: This note was partially created using Dragon voice recognition software. Some transcription errors may have escaped proofreading.

## 2022-11-15 ENCOUNTER — Ambulatory Visit
Attending: Rehabilitative and Restorative Service Providers" | Admitting: Rehabilitative and Restorative Service Providers"

## 2022-11-15 NOTE — Allied Health Progress (Addendum)
PHYSICAL THERAPY EVALUATION 11/15/2022     Diagnosis:  Therapy diagnosis: [id number], R29.898,right knee pain, right leg weakness  Medical diagnosis: S83.241D (ICD-10-CM) - Tear of medial meniscus of right knee, current, unspecified tear type, subsequent encounter     Authorizing MD (First and last name) and PI#: Johnnye Sima PA  Onset Date:   05/15/2022  Start of Care Date: 11/15/2022   Prior Level of function: for past 6 months decline in walking tolerance and increasing pain  Prior treatment for this diagnosis: yes  Rehab potential: Good    Precautions: none  Patient's goals are to have less pain right knee when walking and standing.    Plan of care:  Established 11/15/2022   1 times a week for a total of 12 treatments.   To include   Manual therapy for soft tissue mobilization   Therapeutic exercises for stretches and strengthening exercises to core and LE's  Home Exercise Program  Patient/Caregiver Education    Goals:   Patient will be able to walk 45 minutes with 3-4/10 right knee pain at worse.  Patient will be able to stand 30 minutes with 3-4/10 right knee pain at worse.  Patient will be independent with home exercise program and home recommendations.   Able to walk with a normal gait pattern.        Total Evaluation time:  30 minutes  Treatment time in addition to evaluation:  25 minutes        Clinical Evaluation     SUBJECTIVE  History of current problem:                          Amanda Williamson is a 57yrold female who presents with a history of right knee pain since October 2021. She underwent right knee arthroscopic surgery on 12/24/2020 for partial medial and lateral menisectomy, chrondoplasty lateral femoral condyle. She initially made some improvement with reduction in her right knee pain. 6 months ago her pain began getting worse.  She complains of constant aching pain over right anterior and posterior knee rated 7/10 at worst and 3-4/10 at best. Also complains of bilateral hip pain.  Aggravating  factors include: walking 30 minutes, standing 10 minutes, transfers sit to stand, prolonged sitting then getting up to walk.   Easing Factors include: heat helps a little    MRI OF RIGHT KNEE - 10/09/2022  FINDINGS:  Medial compartment: No full-thickness chondral defect or marrow edema medial compartment.  Blunted inner free margin posterior horn medial meniscus.  Mild fraying of the inner free margin body segment.  Anterior horn medial meniscus is intact.  Lateral compartment: Grade 2/3 chondral fissuring weightbearing surface lateral femoral condyle without subchondral marrow edema.  Frayed inner free margin body segment lateral meniscus.  Blunted inner free margin posterior horn.  Anterior horn lateral meniscus is intact.  Cruciate and collateral ligaments: Anterior cruciate ligament is intact.  Posterior cruciate ligament is intact.  Medial bowing of the medial collateral ligament without tear.  Fibular collateral ligament, iliotibial band and biceps for Morris tendons are intact.  Patellofemoral compartment and extensor mechanism: High-grade loss of articular cartilage patellofemoral compartment with approximately 1 cm AP by 1.1 cm transverse by 1.1 cm CC subchondral fibrocystic change lateral trochlear surface and osseous spurring at the joint margins.  Mild distal quadriceps and patellar tendinosis.  Joint space/popliteal fossa/posterior calf: Small joint effusion with minimal fluid in the popliteal bursa.     Impression:  1.  Severe patellofemoral joint osteoarthritis.  Grade 2/3 chondral fissuring weightbearing surface lateral femoral condyle without subchondral marrow edema stable since earlier study.  No acute fracture.  2.  Blunted inner free margin posterior horn medial meniscus.  Mild fraying of the inner free margin body segment.  Anterior horn medial meniscus is intact.  Frayed inner free margin body segment lateral meniscus.  Blunted inner free margin posterior horn.  Anterior horn lateral meniscus  is intact.    3. Anterior cruciate ligament is intact.  Posterior cruciate ligament is intact.  4.  Medial bowing of the medial collateral ligament without tear.  Lateral collateral ligamentous complex is intact.  5.  Mild distal quadriceps and patellar tendinosis.  Small joint effusion.    Patient reports the following    Any popping: yes   Any locking: no   Any giving way: yes      Medical History/Co-morbidities effecting the plan of care: peripheral neuropathy bilateral LE's  Taking gabapentin for this.        Past Medical History:  No date: Diabetes ([place])  No date: Erythema nodosum  09/10/2016: H/O mammogram  No date: Kidney stones  No date: PFS (patellofemoral syndrome)  No date: PONV (postoperative nausea and vomiting)  No date: RAD (reactive airway disease)    Past Surgical History:   Procedure Laterality Date    ------------OTHER------------- Right 02/2018    rotator cuff    COLONOSCOPY  07/08/2012    WNL    COLONOSCOPY  02/24/2015    ESOPHAGOGASTRODUODENOSCOPY (EGD)  02/24/2015    LIGATION, FALLOPIAN TUBE, BILATERAL, USING TUBAL RING  1994    PR ARTHRS KNEE W/MENISCECTOMY MED&LAT W/SHAVING Right 12/24/2020    PR KNEE SCOPE,SHAVE ARTICULAR CART Right 12/24/2020           Social History/Personal and/or environmental factors effecting the plan of care:  married, lives in mobile home with 5 steps to enter.      OBJECTIVE EXAM:    Gait:    Assistive device used: no assistive device    Gait assessment (description of gait pattern): slow cadence, walks with a limp.    Palpation:   Very tender over right medial knee joint line, moderate tenderness right lateral knee joint line  Very tender right iliotibial band, tensor fascia lata, iliopsoas, gluteus medius and maximus.    Range of Motion  Knee   Active (Degrees)   Passive (Degrees) Normal range    Left Right Left Right    flexion 130 120 pain NT NT 140   extension 0 0 pain NT NT 0-10     AROM right hip flexion 90 degrees, abduction 20 degrees , adduction 10  degrees , ER in 90 degrees flexion 20 degrees, IR in 90 degrees flexion 10 degrees, all right hip movements painful  Very tight bilateral hamstrings     Strength MMT  Out of 5 scale   knee Left Right   Flexion  5 4-   Extension  5 4-     Hip strength    Left Right   Flexion 5 4-   Extension 5 4-   Adduction  5 4-   Abduction 5 4-   Internal rotation 5 4-   External rotation 5 4-          Right Knee Special Tests:  Lachman no laxity  Anterior Drawer no laxity  Posterior Drawer test no laxity  Valgus stress no laxity  Varus stress no laxity  MacMurray  maneuver positive, medal meniscus        Treatment today in addition to evaluation(if applicable):    Manual therapy 28 minutes for soft tissue mobilization right iliotibial band, gluteus maximus, gluteus medius, iliopsoas.    Patient was educated in the anatomy and physiology involved.  Common symptoms, treatments, responses to treatment, and self management were discussed with the patient.       ASSESSMENT: The patient presents with worsening right knee pain that is limiting function.  The signs and symptoms are consistent with right patello-femoral joint pain. Benefit from physical therapy for manual therapy for soft tissue mobilization, therapeutic exercises for stretches and strengthening exercises to core and LE's to increase right leg strength to reduce pain while walking and standing.    Clinical Presentation: Evolving with changing characteristics    The components of this evaluation necessitated a Low complexity level of clinical decision making.      Plan for next visit: for soft tissue mobilization and strengthening exercises right LE to decrease pain.       Patient Education:      Method of Teaching: demonstration, verbal  and written hand-out     Learner: patient     Response: verbalizes understanding and able to give return demonstration       Has the plan of care been explained to the  patient/caregiver? yes       Is the patient able to understand the plan of care: yes       Does the patient/caregiver(s) agree with the plan of care: yes       Are there barriers to learning? no.         Motivated to learn? yes       Best learning method: verbal, or demo      Patient/Family participation and agreement with recommendations: verbalized agreement    Is patient currently receiving outside care for this problem:     Home Health care services: no  Skilled nursing facility: no    Other no    Hulan Fray, PT  Evaluation time: 1534 to [id number]  Treatment start time: 1604                                    Treatment stop time: 1632

## 2022-11-16 NOTE — Addendum Note (Signed)
Addended by: Berline Lopes on: 11/16/2022 10:58 AM     Modules accepted: Orders

## 2022-11-26 ENCOUNTER — Ambulatory Visit: Admitting: Rehabilitative and Restorative Service Providers"

## 2022-11-26 NOTE — Allied Health Progress (Addendum)
Physical Therapy Treatment Note: 11/26/2022              Total # of visits: 2/12    Treatment start time: 931         Treatment stop time: 959  Total Treatment Time: 28 minutes    Pre pain: 4/10 right hip, 3/10 right knee  Post pain:       3/10 right hip    Subjective: still bothered by right hip and gluteal pain, also has right lumbar pain.    What matters most for today's session: decreasing right hip and knee pain     Objective: Patient seen for the following treatment:   Instructed on and completed therapeutic exercises 10 minutes as follows, right SLR x10 reps 3 second hold, right hip abduction in left sidelying x10 reps 3 second hold, seated right hip ER and IR x10 reps using L4TB 3 second hold, split squats right leg in front x10 reps, prone right hip extension x10 reps 3 second hold.  Manual therapy 18 minutes for soft tissue mobilization right iliotibial band, gluteus maximus, gluteus medius, iliopsoas to decrease pain and tightness.   post treatment right hip musculature looser and less pain.    Assessment: has right lumbar pain and right hip pain due to altered gait pattern from right knee pain affecting proximal kinetic chain.    Plan: for progression of strengthening exercises    Cala Bradford, PT

## 2022-11-29 ENCOUNTER — Encounter (HOSPITAL_BASED_OUTPATIENT_CLINIC_OR_DEPARTMENT_OTHER): Payer: Self-pay | Admitting: Internal Medicine

## 2022-12-03 ENCOUNTER — Ambulatory Visit
Attending: Rehabilitative and Restorative Service Providers" | Admitting: Rehabilitative and Restorative Service Providers"

## 2022-12-03 NOTE — Allied Health Progress (Addendum)
Physical Therapy Treatment Note: 12/03/2022                Total # of visits: 3/12    Treatment start time: 1004        Treatment stop time: 1032     Total Treatment Time: 28 minutes    Pre pain: 3/10 right hip, 2.5/10 right knee  Post pain:       0/10 right hip and knee    Subjective: reports right hip pain is decreasing, can walk faster and with less pain.  What matters most for today's session: decreasing right hip and knee pain      Objective: Patient seen for the following treatment:   Completed therapeutic exercises 10 minutes as follows, right SLR x10 reps 3 second hold using 1 pound, right hip abduction in left sidelying x10 reps 3 second hold using 1 pound, seated right hip ER and IR x10 reps using L4TB 3 second hold, split squats right leg in front x10 reps, prone right hip extension x10 reps 3 second hold using 1 pound.  Manual therapy 18 minutes for soft tissue mobilization right iliotibial band, gluteus maximus, gluteus medius, iliopsoas to decrease pain and tightness.            Assessment: added 1 pound weight for leg lifts.  Post treatment no pain with walking.    Plan: for progression of strengthening exercises.     Cala Bradford, PT

## 2022-12-08 ENCOUNTER — Ambulatory Visit (HOSPITAL_BASED_OUTPATIENT_CLINIC_OR_DEPARTMENT_OTHER): Admitting: Internal Medicine

## 2022-12-10 ENCOUNTER — Ambulatory Visit: Admitting: Rehabilitative and Restorative Service Providers"

## 2022-12-10 NOTE — Allied Health Progress (Addendum)
Physical Therapy Treatment Note: 12/10/2022           Total # of visits:  4/12    Treatment start time: 933        Treatment stop time: 1001      Total Treatment Time: 28 minutes    Pre pain:          4/10 right hip, 3/10 right knee  Post pain:       2/10 right hip, 0/10 right knee knee    Subjective:  Patient reports she is sleeping better, only wakes once per night due to pain this past week.    What matters most for today's session: decreasing right hip and knee pain, increasing right leg strength      Objective: Patient seen for the following treatment:   Completed therapeutic exercises 10 minutes as follows, right SLR x10 reps 3 second hold using 1 1/2 pounds, right hip abduction in left sidelying x10 reps 3 second hold using 1 1/2 pounds, seated right hip ER and IR x10 reps using L4TB 3 second hold, split squats right leg and left leg in front x10 reps each, prone right hip extension x10 reps 3 second hold using 1 1/2 pounds.  Manual therapy 18 minutes for soft tissue mobilization right iliotibial band, gluteus maximus, gluteus medius, iliopsoas to decrease pain and tightness.      Assessment: still tender over right gluteus medius and iliotibial band.    Plan:  for progression of strengthening exercises.     Amanda Williamson, PT

## 2022-12-15 ENCOUNTER — Telehealth (HOSPITAL_BASED_OUTPATIENT_CLINIC_OR_DEPARTMENT_OTHER): Admitting: Internal Medicine

## 2022-12-15 ENCOUNTER — Encounter (HOSPITAL_BASED_OUTPATIENT_CLINIC_OR_DEPARTMENT_OTHER): Payer: Self-pay | Admitting: Pediatrics

## 2022-12-15 NOTE — Progress Notes (Deleted)
Family Practice Clinic Visit     Date: 12/15/2022  Patient Name: Amanda Williamson    CHIEF COMPLAINT: No chief complaint on file.         HISTORY OF PRESENT ILLNESS:    HPI    Review of Systems    Note: This is a self-reported Review of Systems.    Vitals: LMP 07/24/2016   Patient's last menstrual period was 07/24/2016.    Physical Exam     ASSESSMENT AND PLAN:    ICD-10-CM    1. GAD (generalized anxiety disorder)  F41.1                  No follow-ups on file.    Progress notes reviewed and authenticated by Sherilyn Banker, PA 12/15/2022 11:35 AM    Note: This note was partially created using Dragon voice recognition software. Some transcription errors may have escaped proofreading.

## 2022-12-16 ENCOUNTER — Ambulatory Visit: Admitting: Rehabilitative and Restorative Service Providers"

## 2022-12-16 NOTE — Allied Health Progress (Addendum)
Physical Therapy Treatment Note: 12/16/2022              Total # of visits: 5/12    Treatment start time: 1502        Treatment stop time: 1530     Total Treatment Time: 28 minutes    Pre pain:          5/10 right hip, 4/10 right knee  Post pain:       3/10 right hip, 0/10 right knee knee    Subjective: more right hip and knee pain with walking this past week, right knee feels unstable, wobbly    Objective: Patient seen for the following treatment:   Manual therapy 28 minutes for soft tissue mobilization right iliotibial band, gluteus maximus, gluteus medius, iliopsoas to decrease pain and tightness.       Assessment: more tender over right gluteus medius and iliotibial band today.    Plan: for soft tissue mobilization to  decrease pain    Cala Bradford, PT

## 2022-12-24 ENCOUNTER — Ambulatory Visit: Admitting: Rehabilitative and Restorative Service Providers"

## 2022-12-24 NOTE — Allied Health Progress (Addendum)
Physical Therapy Treatment Note: 12/24/2022              Total # of visits: 6/12    Treatment start time: 1010       Treatment stop time: 1038     Total Treatment Time: 28 minutes    Pre pain:          3/10 right hip, 3/10 right knee  Post pain:        0/10 right hip, 0/10 right knee knee    Subjective: reports right hip and knee pain much better this week.    What matters most for today's session: decreasing right hip and knee pain, increasing right leg strength      Objective: Patient seen for the following treatment:   Completed therapeutic exercises 10 minutes as follows, right SLR x10 reps 3 second hold using 1 1/2 pounds, right hip abduction in left sidelying x10 reps 3 second hold using 1 1/2 pounds, seated right hip ER and IR x10 reps using L4TB 3 second hold, split squats right leg and left leg in front x10 reps each, prone right hip extension x10 reps 3 second hold using 1 1/2 pounds.   Manual therapy 18 minutes for soft tissue mobilization right iliotibial band, gluteus maximus, gluteus medius, iliopsoas to decrease pain and tightness.       Assessment: post treatment no right hip or knee pain walking out from therapy room.    Plan:  for soft tissue mobilization to decrease pain     Cala Bradford, PT

## 2022-12-30 ENCOUNTER — Other Ambulatory Visit (HOSPITAL_BASED_OUTPATIENT_CLINIC_OR_DEPARTMENT_OTHER): Payer: Self-pay | Admitting: Internal Medicine

## 2022-12-30 DIAGNOSIS — Z1231 Encounter for screening mammogram for malignant neoplasm of breast: Secondary | ICD-10-CM

## 2022-12-31 ENCOUNTER — Ambulatory Visit: Admitting: Rehabilitative and Restorative Service Providers"

## 2023-01-07 ENCOUNTER — Ambulatory Visit
Attending: Rehabilitative and Restorative Service Providers" | Admitting: Rehabilitative and Restorative Service Providers"

## 2023-01-07 NOTE — Progress Notes (Signed)
Physical Therapy Treatment Note: 01/07/2023   Time in 1106    Total # of visits: 7/10    Total Treatment Time: 31  Pre pain:          4/10 right hip, 2/10 right knee  Post pain:        0/10 right hip, 0/10 right knee knee    Subjective: Had a cold last week, feeling much better today.     Objective:   Completed therapeutic exercises 23 minutes as follows, right SLR x10 reps 3 second hold using 2 pounds, right hip abduction in left sidelying x10 reps 3 second hold using 2 pounds, seated right hip ER and IR x10 reps using L4TB 3 second hold, split squats right leg and left leg in front x10 reps each, Not today-prone right hip extension x10 reps 3 second hold using 1 1/2 pounds.     Manual therapy 8 minutes for soft tissue mobilization right iliotibial band, gluteus maximus, gluteus medius, iliopsoas to decrease pain and tightness        Assessment: Progressing well with therex, no pain noted during exercises today.  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       Plan: Continue physical therapy - as per plan of care.     In the event of unattended discharge, this note will serve as discharge document.    Electronically signed by  Rae Roam, DPT  Physical Therapist

## 2023-01-19 ENCOUNTER — Ambulatory Visit: Admitting: Rehabilitative and Restorative Service Providers"

## 2023-01-19 NOTE — Allied Health Progress (Signed)
Physical Therapy Treatment Note: 01/19/2023   Time in 1100    Total # of visits: 8/10    Total Treatment Time: 30    Subjective: Doing well no new complaints R knee has no pain today, still wearing brace for support when out.      Objective:   Completed therapeutic exercises 23 minutes as follows, right SLR x10 reps 3 second hold using 2 pounds, right hip abduction in left sidelying x10 reps 3 second hold using 2 pounds, seated right hip ER and IR x10 reps using L4TB 3 second hold, split squats right leg and left leg in front x10 reps each,prone right hip extension x10 reps 3 second hold using 2 pounds.  Standing Hip Abd with L4TB 2X10 ea   Standing balance/proprioception on [employer] with head turns rotation and diagonal     Manual therapy 8 minutes for soft tissue mobilization right iliotibial band, gluteus maximus, gluteus medius, iliopsoas to decrease pain and tightness    Assessment: Performing spilt squats with good form and strength today.   Patient tends to lock R knee out in standing to compensate.   Patient encouraged to perform HEP.       Plan: Continue physical therapy - as per plan of care. Progressive strengthening RLE    In the event of unattended discharge, this note will serve as discharge document.    Electronically signed by  Rae Roam, DPT  Physical Therapist

## 2023-01-28 ENCOUNTER — Ambulatory Visit: Admitting: Rehabilitative and Restorative Service Providers"

## 2023-01-28 NOTE — Allied Health Progress (Signed)
Physical Therapy Treatment Note: 01/28/2023               Total # of visits: 9/12     Treatment start time: 1301        Treatment stop time: 1329      Total Treatment Time: 28 minutes     Pre pain:          3/10 right hip, 1/10 right knee  Post pain:       0/10 right hip and knee     Subjective: patient states on good days can walk and stand in grocery store 30 minutes but on days when pain already present when getting up in the morning cannot walk for very long due to pain.    What matters most for today's session: decreasing right hip and knee pain        Objective: Patient seen for the following treatment:   Completed therapeutic exercises 10 minutes as follows, right SLR x10 reps 3 second hold using  2 pounds, right hip abduction in left sidelying x10 reps 3 second hold using 2 pounds, seated right hip ER and IR x10 reps using L4TB 3 second hold, split squats right leg and left leg in front x10 reps each, prone right hip extension x10 reps 3 second hold using 2 pounds.   Manual therapy 18 minutes for soft tissue mobilization right iliotibial band, gluteus maximus, gluteus medius, iliopsoas to decrease pain and tightness.       Assessment: patient walking and standing tolerance has improved.    Plan: progress strengthening exercises to LE's.    Cala Bradford, PT

## 2023-02-04 ENCOUNTER — Ambulatory Visit
Attending: Rehabilitative and Restorative Service Providers" | Admitting: Rehabilitative and Restorative Service Providers"

## 2023-02-04 NOTE — Allied Health Progress (Signed)
Physical Therapy Treatment Note: 02/04/2023              Total # of visits: 10/12     Treatment start time: 1501        Treatment stop time: 1529      Total Treatment Time: 28 minutes     Pre pain:          0/10 right hip, 3/10 right knee  Post pain:       0/10 right hip and knee     Subjective: no hip pain today and mild right knee pain     What matters most for today's session: decreasing right hip and knee pain            Objective: Patient seen for the following treatment:   Completed therapeutic exercises 10 minutes as follows, right SLR x10 reps 3 second hold using  2 pounds, right hip abduction in left sidelying x10 reps 3 second hold using 2 pounds, seated right hip ER and IR x10 reps using L4TB 3 second hold, split squats right leg and left leg in front x10 reps each, prone right hip extension x10 reps 3 second hold using 2 pounds.   Manual therapy 18 minutes for soft tissue mobilization right iliotibial band, gluteus maximus, gluteus medius, iliopsoas to decrease pain and tightness.       Assessment: less pain on days where she takes it easy. Having more days where pain is fairly good.    Plan: continue strengthening exercises right LE.    Amanda Williamson, PT

## 2023-02-11 ENCOUNTER — Ambulatory Visit: Admitting: Rehabilitative and Restorative Service Providers"

## 2023-02-11 NOTE — Allied Health Progress (Signed)
Physical Therapy Treatment Note: 02/11/2023              Total # of visits: 11/12     Treatment start time: 1335        Treatment stop time: 1403      Total Treatment Time: 28 minutes     Pre pain:          2.5/10 right hip, 3.5/10 right knee  Post pain:       0/10 right hip and knee     Subjective: no hip pain today and mild right knee pain     What matters most for today's session: decreasing right hip and knee pain        Objective: Patient seen for the following treatment:   Completed therapeutic exercises 10 minutes as follows, right SLR x10 reps 3 second hold using  2 1/2 pounds, right hip abduction in left sidelying x10 reps 3 second hold using 2 1/2 pounds, seated right hip ER and IR x10 reps using L4TB 3 second hold, split squats right leg and left leg in front x10 reps each, prone right hip extension x10 reps 3 second hold using 2 1/2 pounds.   Manual therapy 18 minutes for soft tissue mobilization right iliotibial band, gluteus maximus, gluteus medius, iliopsoas to decrease pain and tightness.       Assessment: overall intensity of right knee and hip pain less. Tolerates longer periods of standing and walking.    Plan: continue strengthening exercises.    Cala Bradford, PT

## 2023-02-25 ENCOUNTER — Ambulatory Visit: Admitting: Rehabilitative and Restorative Service Providers"

## 2023-02-25 NOTE — Allied Health Progress (Signed)
Physical Therapy Treatment Note and Discharge Summary: 02/25/2023   Diagnosis:  Therapy diagnosis: M25 561, R29.898,right knee pain, right leg weakness  Medical diagnosis: S83.241D (ICD-10-CM) - Tear of medial meniscus of right knee, current, unspecified tear type, subsequent encounter      Authorizing MD (First and last name) and PI#: Leroy Sea PA  Onset Date:   05/15/2022  Start of Care Date: 11/15/2022           Total # of visits: 12     Treatment start time: 933       Treatment stop time: 1001      Total Treatment Time: 28 minutes     Pre pain:          5/10 right hip, 3/10 right knee  Post pain:       0/10 right hip and knee     Subjective: patient reports being able to sleep most nights without waking up secondary to pain.  Right knee pain 5/10 at worst and right hip pain 6/10 at worst.  What matters most for today's session: decreasing right hip and knee pain      Objective: Patient seen for the following treatment:   Completed therapeutic exercises 10 minutes as follows, right SLR x10 reps 3 second hold using  2 1/2 pounds, right hip abduction in left sidelying x10 reps 3 second hold using 2 1/2 pounds, seated right hip ER and IR x10 reps using L4TB 3 second hold, split squats right leg and left leg in front x10 reps each, prone right hip extension x10 reps 3 second hold using 2 1/2 pounds.   Manual therapy 18 minutes for soft tissue mobilization right iliotibial band, gluteus maximus, gluteus medius, iliopsoas to decrease pain and tightness.          Assessment/ Overall Progress:Treatment goals partially met. Has reached maximum benefit from physical therapy.   Goals set on 11/15/2022  Patient will be able to walk 45 minutes with 3-4/10 right knee pain at worse. PARTIALLY MET, CAN DO 30 MINUTES WITH 4/10 PAIN.  Patient will be able to stand 30 minutes with 3-4/10 right knee pain at worse. NOT MET  Patient will be independent with home exercise program and home recommendations.  MET  Able to walk with a  normal gait pattern.MET  Recommendations/Comments:  Continue home exercise program    Plan:  Discharge patient from physical therapy at this time.    Cala Bradford, PT

## 2023-03-03 ENCOUNTER — Other Ambulatory Visit (HOSPITAL_BASED_OUTPATIENT_CLINIC_OR_DEPARTMENT_OTHER): Payer: Self-pay | Admitting: Internal Medicine

## 2023-03-03 DIAGNOSIS — F411 Generalized anxiety disorder: Secondary | ICD-10-CM

## 2023-03-03 NOTE — Telephone Encounter (Signed)
ast refill: 11/02/2022     Recent Visits  Date Type Provider Dept   11/02/22 Office Visit Audrea Muscat, [place] [facility]   06/21/22 Office Visit Arneta Cliche, [facility]   06/02/22 Office Visit Audrea Muscat, [place] [facility]   04/19/22 Office Visit Lewkowitz, Elmarie Mainland, [facility]   Showing recent visits within past 365 days with a meds authorizing provider and meeting all other requirements  Future Appointments  No visits were found meeting these conditions.  Showing future appointments within next 180 days with a meds authorizing provider and meeting all other requirements     Psychiatric Medication               Gabapentin (NEURONTIN) 400 mg Capsule Take 1 capsule by mouth 3 times daily.    Sertraline (ZOLOFT) 50 mg Tablet Take 1 tablet by mouth every morning.

## 2023-03-04 ENCOUNTER — Telehealth (HOSPITAL_BASED_OUTPATIENT_CLINIC_OR_DEPARTMENT_OTHER): Payer: Self-pay | Admitting: Internal Medicine

## 2023-03-04 DIAGNOSIS — E1169 Type 2 diabetes mellitus with other specified complication: Secondary | ICD-10-CM

## 2023-03-04 DIAGNOSIS — E669 Obesity, unspecified: Secondary | ICD-10-CM

## 2023-03-04 NOTE — Telephone Encounter (Signed)
Pt's had labs 10/29/2022 same labs pended please sign if you approve

## 2023-03-04 NOTE — Telephone Encounter (Signed)
Patient notified

## 2023-03-04 NOTE — Telephone Encounter (Signed)
Signed. -ARL

## 2023-03-04 NOTE — Telephone Encounter (Signed)
PT is scheduled on 6/7 and she is asking if Magda Paganini would like her to have lab work prior to the appointment to check her A1C, her last labs were done in January. She can be reached at [phone]

## 2023-03-07 ENCOUNTER — Ambulatory Visit: Attending: Family Medicine

## 2023-03-07 LAB — COMPREHENSIVE METABOLIC PANEL
Adjusted Calcium: 9.5 mg/dL (ref 8.7–10.2)
Alanine Transferase (ALT): 19 U/L (ref 4–56)
Alb/Glob Ratio: 0.9 — ABNORMAL LOW (ref 1.0–1.6)
Albumin: 3.7 g/dL (ref 3.2–4.7)
Alkaline Phosphatase (ALP): 57 U/L (ref 38–126)
Anion Gap: 10 mmol/L (ref 7–15)
Aspartate Transaminase (AST): 19 U/L (ref 9–44)
BUN/ Creatinine: 27.3 — ABNORMAL HIGH (ref 7.3–21.7)
Bilirubin Total: 0.6 mg/dL (ref 0.1–2.2)
Calcium: 9.3 mg/dL (ref 8.7–10.2)
Carbon Dioxide Total: 25 mmol/L (ref 22–32)
Chloride: 103 mmol/L (ref 99–109)
Creatinine Serum: 0.66 mg/dL (ref 0.50–1.30)
Globulin: 4 g/dL (ref 2.2–4.2)
Glucose: 142 mg/dL — ABNORMAL HIGH (ref 70–99)
Potassium: 4.1 mmol/L (ref 3.5–5.2)
Protein: 7.7 g/dL (ref 5.9–8.2)
Sodium: 134 mmol/L (ref 134–143)
Urea Nitrogen, Blood (BUN): 18 mg/dL (ref 6–21)
eGFR Creatinine (Female): 102 mL/min/{1.73_m2} (ref >60)

## 2023-03-07 LAB — HEMOGLOBIN A1C
Hgb A1C,Glucose Est Avg: 146 mg/dL
Hgb A1C: 6.7 % — ABNORMAL HIGH (ref 4.0–5.6)

## 2023-03-07 NOTE — Progress Notes (Signed)
Will address at 03/11/2023 appointment; okay to close. -ARL

## 2023-03-11 ENCOUNTER — Ambulatory Visit (HOSPITAL_BASED_OUTPATIENT_CLINIC_OR_DEPARTMENT_OTHER): Admitting: Internal Medicine

## 2023-03-11 ENCOUNTER — Encounter (HOSPITAL_BASED_OUTPATIENT_CLINIC_OR_DEPARTMENT_OTHER): Payer: Self-pay | Admitting: Internal Medicine

## 2023-03-11 VITALS — BP 122/72 | HR 92 | Temp 95.7°F | Ht 64.02 in | Wt 172.6 lb

## 2023-03-11 MED ORDER — GABAPENTIN 400 MG CAPSULE
800.0000 mg | ORAL_CAPSULE | Freq: Two times a day (BID) | ORAL | 0 refills | Status: DC
Start: 2023-03-11 — End: 2023-07-18

## 2023-03-11 MED ORDER — SERTRALINE 100 MG TABLET
100.0000 mg | ORAL_TABLET | Freq: Every day | ORAL | 0 refills | Status: DC
Start: 2023-03-11 — End: 2023-06-07

## 2023-03-11 NOTE — Progress Notes (Signed)
Family Practice Clinic Visit     Date: 03/11/2023  Patient Name: Amanda Williamson    CHIEF COMPLAINT:   Chief Complaint   Patient presents with    Hand Pain     Painful and are getting stiff     Joint Pain     Left Shoulder locks up.     Mouth Problem     Mouth is vary dry           HISTORY OF PRESENT ILLNESS:    HPI  She continues with gabapentin with peripheral neuropathy however still have peripheral neuropathy symptoms. She complains of bilateral stiffness oh LE joints, uncertain if other workup indicate. She is compliant with Zoloft 100 mg, admits she continues to worry and stress about her family.     Review of Systems   Constitutional:  Positive for fatigue. Negative for activity change and appetite change.   HENT: Negative.     Respiratory:  Negative for shortness of breath.    Cardiovascular:  Negative for chest pain, palpitations and leg swelling.   Gastrointestinal:  Negative for abdominal pain, constipation, diarrhea, nausea and vomiting.   Endocrine: Negative for cold intolerance and heat intolerance.   Genitourinary: Negative.    Musculoskeletal:  Positive for arthralgias, back pain and myalgias (improving). Negative for gait problem.   Skin: Negative.    Allergic/Immunologic: Positive for environmental allergies.   Neurological:  Positive for numbness (chronic).   Psychiatric/Behavioral:  Negative for behavioral problems, dysphoric mood and sleep disturbance. The patient is nervous/anxious.        Note: This is a self-reported Review of Systems.      Current Outpatient Medications:     Acetaminophen (TYLENOL) 500 mg Tablet, Take 1 tablet by mouth every 6 hours if needed for pain or Fever., Disp: , Rfl:     Azelastine Nasal (ASTELIN) 137 mcg (0.1 %) Spray, INSTILL ONE SPRAY INTO EACH NOSTRIL TWO TIMES DAILY., Disp: 90 mL, Rfl: 3    Ferrous Sulfate 325 mg (65 mg iron) DR Tablet, Take 1 tablet by mouth every morning., Disp: , Rfl:     Fluticasone (FLONASE) 50 mcg/actuation nasal spray, 1 spray each nostril  twice daily (Patient not taking: Reported on 03/11/2023), Disp: 16 g, Rfl: 3    Gabapentin (NEURONTIN) 400 mg Capsule, Take 2 capsules by mouth 2 times daily., Disp: 360 capsule, Rfl: 0    Lisinopril (PRINIVIL, ZESTRIL) 2.5 mg Tablet, Take 1 tablet by mouth every day., Disp: 90 tablet, Rfl: 3    MAGNESIUM PO, Take 1 tablet by mouth every morning., Disp: , Rfl:     Meloxicam (MOBIC) 15 mg tablet, Take 1 tablet by mouth once daily after a meal. (Patient not taking: Reported on 03/11/2023), Disp: 30 tablet, Rfl: 0    Metformin (GLUCOPHAGE) 1,000 mg tablet, Take 1 tablet by mouth 2 times daily with meals., Disp: 180 tablet, Rfl: 3    MULTIVITAMIN PO, Take 1 tablet by mouth every morning. Twice a day, Disp: , Rfl:     Psyllium Husk 0.52 gram Capsule, Take 1 capsule by mouth every morning., Disp: , Rfl:     Sertraline (ZOLOFT) 100 mg Tablet, Take 1 tablet by mouth every morning., Disp: 90 tablet, Rfl: 0    Simvastatin (ZOCOR) 10 mg Tablet, Take 1 tablet by mouth every day., Disp: 90 tablet, Rfl: 3    TURMERIC PO, Take 1 capsule by mouth every day at bedtime., Disp: , Rfl:     Vitals: BP 122/72 (  SITE: left arm, Orthostatic Position: sitting, Cuff Size: regular)   Pulse 92   Temp (!) 35.4 C (95.7 F) (Temporal)   Ht 1.626 m (5' 4.02")   Wt 78.3 kg (172 lb 9.6 oz)   LMP 07/24/2016   SpO2 98%   BMI 29.61 kg/m   Patient's last menstrual period was 07/24/2016.    Physical Exam  Constitutional:       Appearance: Normal appearance. She is well-groomed and overweight.   Cardiovascular:      Rate and Rhythm: Normal rate and regular rhythm.      Pulses: Normal pulses.      Heart sounds: Normal heart sounds.   Pulmonary:      Effort: Pulmonary effort is normal. No respiratory distress.      Breath sounds: Normal breath sounds. No wheezing.   Abdominal:      General: Bowel sounds are normal. There is no distension.      Palpations: Abdomen is soft.      Tenderness: There is no abdominal tenderness. There is no guarding.   Skin:      General: Skin is warm and dry.   Neurological:      Mental Status: She is alert.   Psychiatric:         Attention and Perception: Attention normal.         Mood and Affect: Mood is anxious and depressed. Affect is tearful.         Speech: Speech normal.         Behavior: Behavior normal. Behavior is cooperative.          ASSESSMENT AND PLAN:    ICD-10-CM    1. Type 2 diabetes mellitus with obesity (HCC)  E11.69 PODIATRY REFERRAL    E66.9 [facility] FOOT BILATERAL COMPLETE     [facility] Vitamin B12 & Folic Acid     Vitamin B1(Thiamine) Whole Bl      2. Polyneuropathy associated with underlying disease (HCC)  G63 PODIATRY REFERRAL     [facility] FOOT BILATERAL COMPLETE      3. GAD (generalized anxiety disorder)  F41.1 Sertraline (ZOLOFT) 100 mg Tablet      4. Other diabetic neurological complication associated with type 2 diabetes mellitus (HCC)  E11.49 PODIATRY REFERRAL     Gabapentin (NEURONTIN) 400 mg Capsule         -Foot x-ray and subsequent Referral to Podiatry given extensive peripheral neuropathy and progressive symptoms  -Indicated labs to be completed in near future  -CONTINUE with Zoloft 100 mg; may consider increasing in near future  -Gabapentin dosing adjust to 800 mg BID; may consider alternate medication for added symptomatic improvement  -Pt scheduled in near future with TOC  -RTO as scheduled, sooner if needed.    Return if symptoms worsen or fail to improve.    Progress notes reviewed and authenticated by Omelia Blackwater, PA-C 03/11/2023 9:42 AM    Note: This note was partially created using Dragon voice recognition software. Some transcription errors may have escaped proofreading.

## 2023-03-18 ENCOUNTER — Other Ambulatory Visit: Payer: Self-pay | Admitting: Primary Care

## 2023-03-18 ENCOUNTER — Ambulatory Visit
Admission: RE | Admit: 2023-03-18 | Discharge: 2023-03-18 | Disposition: A | Discharge status: Home / Self Care | Source: Ambulatory Visit | Attending: Internal Medicine | Admitting: Internal Medicine

## 2023-03-18 ENCOUNTER — Ambulatory Visit: Admission: RE | Admit: 2023-03-18 | Source: Ambulatory Visit

## 2023-03-18 DIAGNOSIS — Z1231 Encounter for screening mammogram for malignant neoplasm of breast: Secondary | ICD-10-CM

## 2023-03-24 ENCOUNTER — Telehealth (HOSPITAL_BASED_OUTPATIENT_CLINIC_OR_DEPARTMENT_OTHER): Payer: Self-pay | Admitting: Internal Medicine

## 2023-03-24 NOTE — Telephone Encounter (Signed)
[name] informed chart has been signed

## 2023-03-24 NOTE — Telephone Encounter (Signed)
Signed. -ARL

## 2023-03-24 NOTE — Telephone Encounter (Signed)
Amanda Williamson @[facility]  medical records [phone] called to request 6/7 office note be signed as its holding up pt disability.

## 2023-03-25 ENCOUNTER — Encounter (HOSPITAL_BASED_OUTPATIENT_CLINIC_OR_DEPARTMENT_OTHER): Payer: Self-pay | Admitting: Internal Medicine

## 2023-03-30 ENCOUNTER — Telehealth (HOSPITAL_BASED_OUTPATIENT_CLINIC_OR_DEPARTMENT_OTHER): Payer: Self-pay | Admitting: Internal Medicine

## 2023-03-30 NOTE — Telephone Encounter (Signed)
Pt informed referral has been marked urgent

## 2023-03-30 NOTE — Addendum Note (Signed)
Addended by: Gordy Levan on: 03/30/2023 11:05 AM     Modules accepted: Orders

## 2023-03-30 NOTE — Telephone Encounter (Signed)
Pt called, she was able to schedule an appt with podiatry but it's not until 10/2. She was told by their office to request an urgent referral so they can get her in sooner as she is in a lot of pain.

## 2023-03-30 NOTE — Telephone Encounter (Signed)
Changed to urgent. -ARL

## 2023-04-25 ENCOUNTER — Other Ambulatory Visit (HOSPITAL_BASED_OUTPATIENT_CLINIC_OR_DEPARTMENT_OTHER): Payer: Self-pay | Admitting: Internal Medicine

## 2023-04-25 DIAGNOSIS — E785 Hyperlipidemia, unspecified: Secondary | ICD-10-CM

## 2023-04-25 DIAGNOSIS — I10 Essential (primary) hypertension: Secondary | ICD-10-CM

## 2023-04-25 MED ORDER — LISINOPRIL 2.5 MG TABLET: 2.5000 mg | ORAL_TABLET | Freq: Every day | ORAL | 1 refills | Status: DC

## 2023-04-25 MED ORDER — SIMVASTATIN 10 MG TABLET: 10.0000 mg | ORAL_TABLET | Freq: Every day | ORAL | 1 refills | Status: DC

## 2023-04-25 NOTE — Telephone Encounter (Signed)
66-month medication refills given, needs TOC with new provider prior to next refill in January.

## 2023-04-25 NOTE — Telephone Encounter (Signed)
Refill Guidance:  Typical Refill: 90 days / 3 refills  Schedule appointment before sending request if last visit over 6 months ago, or if more than 1 month past due per last progress notes  Please verify correct pharmacy  Please ensure 90 day supply for mail order pharmacies.  Please verify prescription days equals quantity x refills  BP Readings from Last 3 Encounters:   03/11/23 122/72   11/02/22 130/74   06/21/22 (!) 140/70     Lab Results   Component Value Date    CR 0.66 03/07/2023    LDLC 69 04/13/2022      Recent Visits  Date Type Provider Dept   03/30/23 Telephone [facility]ordy Levan, [facility]   03/24/23 Telephone [facility] Levan, [facility]   03/11/23 Office Visit Gordy Levan, [place] [facility]   03/04/23 Telephone [facility]y Levan, [facility]   11/02/22 Office Visit Gordy Levan, [place] [facility]   06/21/22 Office Visit Arneta Cliche, NP [facility]   06/02/22 Office Visit [facility] Levan, [facility]   Showing recent visits within past 365 days and meeting all other requirements  Future Appointments  No visits were found meeting these conditions.  Showing future appointments within next 180 days and meeting all other requirements     BP Medications in Epic (for confirmation)    Hypertensive Medication               Lisinopril (PRINIVIL, ZESTRIL) 2.5 mg Tablet Take 1 tablet by mouth every day.

## 2023-04-25 NOTE — Telephone Encounter (Signed)
Last appt 03/11/2023    Refill Guidance:  Typical Refill: 90 days / 3 refills  Schedule appointment before sending request if last visit over 1 year ago  Please verify correct pharmacy  Please ensure 90 day supply for mail order pharmacies.  Please verify prescription days equals quantity x refills  Lab Results   Component Value Date    AST 19 03/07/2023    ALT 19 03/07/2023    CHOL 129 04/13/2022    LDLC 69 04/13/2022    LDLC 90 10/28/2020    HDL 32 (L) 04/13/2022    TRIG 139 04/13/2022     Recent Visits  Date Type Provider Dept   03/30/23 Telephone [facility]y Levan, [facility]   03/24/23 Telephone [facility]   03/11/23 Office Visit Gordy Levan, [place] [facility]   03/04/23 Telephone [facility] Lev[facility], [facility]   11/02/22 Office Visit Gordy Levan, [place] [facility]   06/21/22 Office Visit Arneta Cliche, NP [facility]   06/02/22 Office Visit [facility]   Showing recent visits within past 365 days and meeting all other requirements  Future Appointments  No visits were found meeting these conditions.  Showing future appointments within next 180 days and meeting all other requirements     Lipid Medications in Epic (for confirmation)  Lipid Medication               Simvastatin (ZOCOR) 10 mg Tablet Take 1 tablet by mouth every day.

## 2023-04-27 ENCOUNTER — Emergency Department
Admission: AD | Admit: 2023-04-27 | Discharge: 2023-04-27 | Disposition: A | Discharge status: Home / Self Care | Attending: Medical | Admitting: Medical

## 2023-04-27 NOTE — ED Nursing Note (Signed)
Discharge Note      Written and verbal education provided; the patient verbalized understanding.  All questions and concerns addressed. Patient discharged to Home. All belongings sent with patient at time of discharge.  Patient leaves the ED ambulatory with Self.    Discharge Reassessment: patient aware of discharge instructions and need to come back if she gets sudden severe headache    Patient's pain/comfort level assessed during their ED course and interventions for increased comfort were implemented as needed.    Non-pharmacological interventions: repositioning.    Interventions were effective.

## 2023-04-27 NOTE — Discharge Instructions (Signed)
Return to the emergency department for progressive headache with worst headache of life, sudden onset headache with maximum intensity in a short period of time, vision changes, vomiting, or any other concern.  Follow-up with primary care.

## 2023-04-27 NOTE — ED Provider Notes (Signed)
EMERGENCY DEPARTMENT PHYSICIAN NOTE - Amanda Williamson       Date of Service:   04/27/2023  3:48 PM Patient's PCP: Gordy Levan (Inactive)   Note Started: 04/27/2023 16:30 DOB: [birth year]             Chief Complaint   Patient presents with    Head Injury     No thinners, no LOC           The history provided by the patient.  Interpreter used:      Amanda Williamson is a 57yr old female, with a past medical history significant for diabetes presenting to the emergency department for evaluation of head injury.  Onset was at 12:30 PM.  Patient states that she bent down to pick something up and when she stood up, she struck the top of her head on a door latch.  No loss of consciousness.  Patient denies vision changes, vomiting.  No amnesia.  Patient denies neck pain.  Patient notes minimal bleeding that is controlled prior to arrival.    Quality: Sharp  Location: Left side of her head  Severity: 5/10  Time Course: constant  Progression: unchanged  Duration: Onset at 12:30 PM  Palliative factors: nothing makes it better.  Provocative factors: Touching makes it worse.  Associated symptoms: None  Pertinent negatives: No confusion, difficult speech, numbness, tingling, weakness in the extremities.  No loss of consciousness, amnesia, vision changes, vomiting.    A full history, including pertinent past medical and social history was The history provided by the patient.Marland Kitchen    HISTORY:  1    *Closed head injury, initial encounter     No Known Allergies   Past Medical History:  No date: Diabetes (HCC)  No date: Erythema nodosum  09/10/2016: H/O mammogram  No date: Kidney stones  No date: PFS (patellofemoral syndrome)  No date: PONV (postoperative nausea and vomiting)  No date: RAD (reactive airway disease) Past Surgical History:  02/2018: ------------other-------------; Right  07/08/2012: Colonoscopy  02/24/2015: Colonoscopy  02/24/2015: Esophagogastroduodenoscopy (egd)  1994: Ligation, fallopian tube, bilateral, using tubal  ring  12/24/2020: Pr arthrs knee w/meniscectomy med&lat w/shaving; Right  12/24/2020: Pr knee scope,shave articular cart; Right   Social History    Socioeconomic History      Marital status: SINGLE      Spouse name: Not on file      Number of children: Not on file      Years of education: Not on file      Highest education level: Not on file    Occupational History      Not on file    Tobacco Use      Smoking status: Never      Smokeless tobacco: Never    Substance and Sexual Activity      Alcohol use: No      Drug use: No      Sexual activity: Not on file    Other Topics      Concerns:        Not on file    Social History Narrative      Not on file    Social Determinants of Health     Financial Resource Strain: Not on File (05/18/2020)    Received from [employer], [employer]     Financial Resource Strain: 0   Food Insecurity: Not on File (05/18/2020)    Received from [place], [employer]  Food: 0   Transportation Needs: Not on File (05/18/2020)    Received from [facility], [employer] Needs     Transportation: 0   Physical Activity: Not on File (05/18/2020)    Received from [place], [place]    Physical Activity     Physical Activity: 0   Stress: Not on File (05/18/2020)    Received from [facility], [place]    Stress     Stress: 0   Social Connections: Not on File (05/18/2020)    Received from [place], [place]    Social Connections     Social Connections and Isolation: 0   Intimate Partner Violence: Not on file   Housing Stability: Not on File (05/18/2020)    Received from [place], [employer] Stability     Housing: 0    Review of patient's family history indicates:  Problem: Arthritis      Relation: Mother          Age of Onset: (Not Specified)          Comment: Rheumitoid  Problem: Lipids      Relation: Mother          Age of Onset: (Not Specified)  Problem: Diabetes      Relation: Father          Age of Onset: (Not Specified)  Problem: Other (Other)      Relation: Father          Age of  Onset: (Not Specified)          Comment: Kidney faliure  Problem: No Known Problems      Relation: Sister          Age of Onset: (Not Specified)  Problem: No Known Problems      Relation: Brother          Age of Onset: (Not Specified)  Problem: No Known Problems      Relation: Brother          Age of Onset: (Not Specified)  Problem: No Known Problems      Relation: Brother          Age of Onset: (Not Specified)  Problem: No Known Problems      Relation: Daughter          Age of Onset: (Not Specified)  Problem: No Known Problems      Relation: Daughter          Age of Onset: (Not Specified)  Problem: No Known Problems      Relation: Daughter          Age of Onset: (Not Specified)  Problem: Other (Other)      Relation: Son          Age of Onset: (Not Specified)          Comment: pulmonary  Problem: Diabetes      Relation: Son          Age of Onset: (Not Specified)          Comment: childhood         Physical Exam  VITALS - vital signs documented in nursing assessment were reviewed as noted, see EHR    GENERAL - awake, alert, orientated, GCS eyes 4, verbal 5, motor 6 for a GCS of 15, no apparent distress, nontoxic-appearing    HEENT - normocephalic, atraumatic, pupils equal, round, and reactive to light and accommodation. There is no scleral icterus/injection, conjunctiva are clear bilaterally, TMs  are clear bilaterally, nose normal, oropharynx is moist without erythema or exudate.  No hemotympanums, nasal septal hematoma.  Patient has symmetric sensation in each branch of the trigeminal nerve.  No tongue deviation, uvula deviation.  No facial weakness/paralysis.    NECK - normal range of motion, supple, no midline tenderness or step-off deformity    CARDIO-VASCULAR - regular rate and rhythm, no murmurs, rubs or gallops    PULMONARY - no respiratory distress, speaking in full sentences, clear to auscultation bilaterally, no wheezes/rhonchi/rales    GASTROINTESTINAL - nondistended    BACK EXAM - there is no midline  tenderness to the axial spine. No paraspinal tenderness is noted.    NEUROLOGIC - normal baseline mental status, speech fluid, cognition normal, cranial nerves II-XII intact, 5/5 motor in upper and lower extremities bilaterally, normal sensorium throughout, normal gait    MUSCULOSKELETAL - well-nourished, well-developed, back normal in appearance, nontender with normal range of motion in upper and lower extremities bilaterally.    PSYCHIATRIC - normal congruent mood and affect, normal insight, normal concentration     Review of Systems  Constitutional: No fevers/chills    Eye: No recent visual problems    ENMT: No ear pain, nasal congestion, sore throat    Respiratory: No shortness of breath, cough    Cardiovascular: No chest pain    Gastrointestinal: No nausea, vomiting, diarrhea    Genitourinary: No hematuria    Musculoskeletal: No muscle/joint pain    Skin: Contusion/abrasion to the left frontal scalp    Neurologic: No headache    TRIAGE VITAL SIGNS:  Temp: 36.4 C (97.6 F) (04/27/23 1331)  Temp src: Oral (04/27/23 1331)  Pulse: 84 (04/27/23 1331)  BP: 123/79 (04/27/23 1331)  Resp: 16 (04/27/23 1331)  SpO2: 98 % (04/27/23 1331)  Weight: 77.6 kg (171 lb) (04/27/23 1331)          INITIAL ASSESSMENT & PLAN, MEDICAL DECISION MAKING, ED COURSE  Amanda Williamson is a 71yr female who presents with a chief complaint of head injury.  Onset 12:30 PM.  Patient GCS 15 with normal vitals.  Exam significant for contusion with abrasion and small hematoma at the left frontal scalp.  Patient neuro intact including cranial nerve testing.  Patient not taking blood thinning medication.  Per [place] CT head rules no indication for imaging at this time.  I did discuss ED return precautions.  At this time, do not suspect skull fracture, ICH.     Differential includes, but is not limited to: This differential diagnosis is broad and complex, and includes, but is not limited to intracranial hemorrhage, intraparenchymal hemorrhage,  epidural hematoma, subdural hematoma, cerebral contusion, concussion, pneumocephalus, fracture, cervical spine fracture or ligamentous injury.           The results of the ED evaluation were notable for the following:    Previous encounter and outside records reviewed:  Outpatient Notes:03/11/23      Chart Review: I reviewed the patient's prior medical records. Pertinent information that is relevant to this encounter PMH, meds, allergies.      Patient Summary: Hx, PE, DC      LAST VITAL SIGNS:  Temp: 36.4 C (97.6 F) (04/27/23 1331)  Temp src: Oral (04/27/23 1331)  Pulse: 71 (04/27/23 1618)  BP: 103/62 (04/27/23 1618)  Resp: 16 (04/27/23 1618)  SpO2: 96 % (04/27/23 1618)  Weight: 77.6 kg (171 lb) (04/27/23 1331)         Clinical Impression: 1.  Closed head injury  APP DOCUMENTATION:  I have evaluated this patient, Dr. Shon Hale was available for consultation.      Disposition:          Disposition: Discharge. ED discharge instructions were reviewed.   Follow-up Information       Follow up With Specialties Details Why Contact Info    [facility] Emergency Department Emergency Department Go to  If symptoms worsen [address]  [place] [address]  [phone]                    PATIENT'S GENERAL CONDITION:  Good: Vital signs are stable and within normal limits. Patient is conscious and comfortable. Indicators are excellent.     Electronically signed by: Sherilyn Banker, PA

## 2023-04-27 NOTE — ED Triage Note (Signed)
Presents to ED for pain to top of head, pt was bending over working on something then stood up and struck top of head on piece of metal, no LOC, no fall, no thinners, small abrasion noted w/ swelling, no bleeding, no subsequent fall or injury, occurred at 1230, GCS 15

## 2023-04-29 ENCOUNTER — Ambulatory Visit (HOSPITAL_BASED_OUTPATIENT_CLINIC_OR_DEPARTMENT_OTHER)

## 2023-06-07 ENCOUNTER — Other Ambulatory Visit (HOSPITAL_BASED_OUTPATIENT_CLINIC_OR_DEPARTMENT_OTHER): Payer: Self-pay | Admitting: Internal Medicine

## 2023-06-07 DIAGNOSIS — F411 Generalized anxiety disorder: Secondary | ICD-10-CM

## 2023-06-07 NOTE — Telephone Encounter (Signed)
Medication Refill Request    Medication(s) being requested: Zoloft     Preferred Pharmacy: [facility]                           Best Contact Number: [phone]    LOV: 03/11/2023 with Magda Paganini  NOV: 07/18/2023 W/ Nedra Hai       Recent Visits  Date Type Provider Dept   03/30/23 Telephone Davonna Belling, Elmarie Mainland, [place] [facility]   03/24/23 Telephone Lewkowitz, Elmarie Mainland, [facility]   03/11/23 Office Visit Lewkowitz, Elmarie Mainland, [place] [facility]   03/04/23 Telephone Davonna Belling, Elmarie Mainland, [facility]   11/02/22 Office Visit Audrea Muscat, [place] [facility]   06/21/22 Office Visit Arneta Cliche, NP [facility]   Showing recent visits within past 365 days and meeting all other requirements  Future Appointments  Date Type Provider Dept   07/18/23 Appointment Arneta Cliche, NP [facility]   Showing future appointments within next 180 days and meeting all other requirements       BP Readings from Last 1 Encounters:   04/27/23 103/62      Pulse Readings from Last 1 Encounters:   04/27/23 71        Lab Results   Component Value Date    NA 134 03/07/2023    K 4.1 03/07/2023    CR 0.66 03/07/2023    AST 19 03/07/2023    LDLC 69 04/13/2022    HGBA1C 6.7 (H) 03/07/2023          Instructions to [facility] before routing]:     Pend refill for 90 days & 3 refills   If patient hasn't been seen in over a year, pend refill for 30 days & 0 refills and schedule an appointment

## 2023-06-08 MED ORDER — SERTRALINE 100 MG TABLET
100.0000 mg | ORAL_TABLET | Freq: Every day | ORAL | 0 refills | Status: DC
Start: 2023-06-08 — End: 2023-09-15

## 2023-06-24 ENCOUNTER — Ambulatory Visit
Admission: RE | Admit: 2023-06-24 | Discharge: 2023-06-24 | Disposition: A | Discharge status: Home / Self Care | Payer: No Typology Code available for payment source | Source: Ambulatory Visit | Attending: Primary Care | Admitting: Primary Care

## 2023-06-24 DIAGNOSIS — Z1231 Encounter for screening mammogram for malignant neoplasm of breast: Secondary | ICD-10-CM | POA: Diagnosis present

## 2023-07-06 ENCOUNTER — Encounter (HOSPITAL_BASED_OUTPATIENT_CLINIC_OR_DEPARTMENT_OTHER): Payer: Self-pay | Admitting: Primary Podiatric Medicine

## 2023-07-06 ENCOUNTER — Ambulatory Visit (HOSPITAL_BASED_OUTPATIENT_CLINIC_OR_DEPARTMENT_OTHER): Admitting: Primary Podiatric Medicine

## 2023-07-06 VITALS — BP 122/64 | HR 80 | Temp 96.7°F | Resp 16 | Ht 64.0 in | Wt 171.0 lb

## 2023-07-06 MED ORDER — LEVOMEFOLATE CA 3 MG-B6 35 MG-MEB12 2 MG-ALGAL OIL 90.314 MG CAPSULE
1.0000 | ORAL_CAPSULE | Freq: Two times a day (BID) | ORAL | 3 refills | Status: DC
Start: 2023-07-06 — End: 2024-01-18

## 2023-07-06 NOTE — Progress Notes (Signed)
"You have access to this medical note to help you improve your understanding of what was discussed during the office visit.  If you have questions about the meaning or medical terminology being used, please bring it up at your next follow up appointment or send [place] a MyChart message with your questions. Medical notes are meant to be a communication tool between medical professionals and require medical terminology to be used for efficiency".    PODIATRY PROGRESS NOTE    PATIENT: Amanda Williamson      MRN:  [id number]        CHIEF COMPLAINT/HISTORY OF PRESENT ILLNESS:  Patient is a  57  year old  female  and presents today for diabetic foot screening/evaluation.  Patient comes in with her daughter Erie Noe.  Her main concern is the neuropathy with numbness and stiffness to the feet.  Most of her symptoms are localized to the toes and the ball of the foot.  Symptoms have been present for the past 2 years.  She also feels that she has new balance problems and uses a cane to ambulate.  She is on gabapentin 400 mg and she takes 3 pills by mouth twice a day.  Patient wants to get established with a podiatrist.  All medications and past Hx has been reviewed and updated. The last hemoglobin A1c is 6.7 about 4 months ago.    She in addition also relates of some numbness to her lips and some changes to her hands as well which she has not discussed with her PCP.  She does have an upcoming visit with Louanne Belton, NP.    Past Medical History[1]  Medications: Current Medications[2]  Allergies:  No Known Allergies   Past Surgical History[3]  Family History[4]  Social History[5]     ROS:  Denies nausea, vomiting, fever, chills, headache and/or chest pain.    OBJECTIVE: The patient is appropriately dressed, articulate, awake, alert, and oriented x 3, appears stated age and looks to be in good health.      Vitals:     Temp: 35.9 C (96.7 F) (10/02 1142)  Temp src: Temporal (10/02 1142)  Pulse: 80 (10/02 1142)  BP: 122/64 (10/02 1142)  Resp:  16 (10/02 1142)  SpO2: 98 % (10/02 1142)  Height: 162.6 cm (5\' 4" ) (10/02 1142)  Weight: 77.6 kg (171 lb) (10/02 1142)    Vascular: Dorsalis pedis pulses are 2/4 and posterior tibial pulses are 2/4 bilateral. Capillary filling time with the leg elevated is  < 3 seconds at the level of the digital tufts bilaterally.     Musculoskeletal: Normal strength, range of motion and alignment for all joints from the ankle distal evident bilateral.  At first when I examined the muscle strength to the anterior muscle group, it appeared to be slightly weak however with further testing noted that she has no deficits to the anterior muscle group.    She is dependent on a cane to walk however I had her walk for me part of the gait cycle and noticed a normal heel-to-toe gait with no deficits.    Neurological:  Protective sensation as tested with a monofilament wire is diminished to the feet all the way up to the ankle to the bilateral plantar feet.  There is also decrease in the sense of proprioception to the forefoot.  Normal muscle mass appreciated to both the lower extremity and foot bilateral.      Dermatological: Normal skin texture and turgor with no break  of the skin.  Otherwise, there is no evidence of edema, ecchymosis, open lesions, signs of bacterial infection of bilateral lower extremities. No varicosities, telangectasias, pigmented lesions or signs of venous stasis changes to bilateral lower extremities.  Adequate fat padding to the inferior aspect of each foot appreciated.  There is evidence of some little red dots to both of the legs which is patient and daughter are not sure how long they have had it.              03/18/2023      Narrative & Impression  X-rays of bilateral feet, 3 views each side, with weightbearing.     INDICATIONS: Bilateral foot pain. Diabetes mellitus type 2 with peripheral neuropathy.     COMPARISON: None.     Findings in the right foot: There is no evidence of fracture or dislocation. There is  also no radiographic evidence of Charcot (neuropathic) foot. Very mild osteoarthritis and a prominent plantar calcaneal osteophyte are noted. Visualized osseous structures otherwise appear unremarkable. Joint spaces are intact throughout. No abnormal calcifications are seen. Soft tissue silhouettes are grossly unremarkable.     Findings in the left foot: There is no evidence of fracture or dislocation. There is also no evidence of Charcot foot. Possible mild hallux valgus, very mild osteoarthritis, a prominent plantar calcaneal osteophyte, and a tiny dorsal calcaneal osteophyte are noted. Visualized osseous structures otherwise appear unremarkable. Joint spaces are intact throughout. No abnormal calcifications are seen. Soft tissue silhouettes are also unremarkable.     IMPRESSION: No radiographic evidence of Charcot foot on either side. Possible mild hallux valgus of the left foot and prominent bilateral plantar calcaneal osteophytes are noted. Otherwise, unremarkable examination for age.    FINDINGS AND IMPRESSIONS:  Diabetes Mellitus with peripheral sensory neuropathy    TREATMENT AND RECOMMENDATIONS:   I reviewed in detail the clinical findings and discussed diabetic foot education with the patient. Some of the possible complications of elevated blood sugars discussed with him.  A schedule for future care needs was explained.  Patient verbalizes understanding of these instructions at this time.  If any questions should arise after returning home I have encouraged the patient to feel free to call the office.   Stressed the use of moisturizing lotion.  Discussed signs and symptoms of peripheral arterial disease.      My recommendation is to pursue neurology workup since her symptoms are quite advanced and she has complete numbness to both of her feet and also has some changes in her proprioception.   Patient is emotional and cries very easily since she feels that her symptoms are worsening and is concerned about  risk of falls from her neuropathy.  She is already on gabapentin and takes 2400 mg a day.    I have added METANX to her regimen and discussed the goal of the medication.  She will take 1 twice a day.    I encouraged her to discuss her symptoms to her upper extremities and her mouth with her PCP p so we can connect all the dots and possibly address the issue and find out a diagnosis if there is anything more than peripheral sensory diabetic neuropathy     Her imaging studies show mild hallux abductovalgus and some plantar exostosis to the calcaneus however she has no pain to these areas. This is an incidental finding.    Patient to return 2 times a year for diabetic/podiatric examination or as needed.    Today, 35  minutes was spent with the patient, of which 30 minutes of that time was spent in counseling and/or  coordination of patient care for all above issues and concerns.    Portions of this note were prepared with Paediatric nurse. Occasional phonetic and grammatical errors may have escaped proofreading.     Sosie Gato K. Staci Acosta  [facility]  [address]  [address]  [place], [place] [address]  [phone]                            [1]   Past Medical History:  Diagnosis Date    Diabetes (HCC)     Erythema nodosum     H/O mammogram 09/10/2016    Kidney stones     PFS (patellofemoral syndrome)     Knee pain    PONV (postoperative nausea and vomiting)     RAD (reactive airway disease)    [2]   Current Outpatient Medications:     Acetaminophen (TYLENOL) 500 mg Tablet, Take 1 tablet by mouth every 6 hours if needed for pain or Fever., Disp: , Rfl:     Azelastine Nasal (ASTELIN) 137 mcg (0.1 %) Spray, INSTILL ONE SPRAY INTO EACH NOSTRIL TWO TIMES DAILY., Disp: 90 mL, Rfl: 3    Gabapentin (NEURONTIN) 400 mg Capsule, Take 2 capsules by mouth 2 times daily., Disp: 360 capsule, Rfl: 0    levomefol-B6-meB12-algal oil (METANX, ALGAL OIL,) 3 mg-35 mg-2 mg -90.314 mg Capsule, Take 1 capsule by  mouth 2 times daily., Disp: 60 capsule, Rfl: 3    Lisinopril (PRINIVIL, ZESTRIL) 2.5 mg Tablet, Take 1 tablet by mouth every day., Disp: 90 tablet, Rfl: 1    MAGNESIUM PO, Take 1 tablet by mouth every morning., Disp: , Rfl:     Metformin (GLUCOPHAGE) 1,000 mg tablet, Take 1 tablet by mouth 2 times daily with meals., Disp: 180 tablet, Rfl: 3    MULTIVITAMIN PO, Take 1 tablet by mouth every morning. Twice a day, Disp: , Rfl:     Psyllium Husk 0.52 gram Capsule, Take 1 capsule by mouth every morning., Disp: , Rfl:     Sertraline (ZOLOFT) 100 mg Tablet, Take 1 tablet by mouth every morning., Disp: 90 tablet, Rfl: 0    Simvastatin (ZOCOR) 10 mg Tablet, Take 1 tablet by mouth every day., Disp: 90 tablet, Rfl: 1    TURMERIC PO, Take 1 capsule by mouth every day at bedtime., Disp: , Rfl:   [3]   Past Surgical History:  Procedure Laterality Date    ------------OTHER------------- Right 02/2018    rotator cuff    COLONOSCOPY  07/08/2012    WNL    COLONOSCOPY  02/24/2015    ESOPHAGOGASTRODUODENOSCOPY (EGD)  02/24/2015    LIGATION, FALLOPIAN TUBE, BILATERAL, USING TUBAL RING  1994    PR ARTHRS KNEE W/MENISCECTOMY MED&LAT W/SHAVING Right 12/24/2020    PR KNEE SCOPE,SHAVE ARTICULAR CART Right 12/24/2020   [4]   Family History  Problem Relation Name Age of Onset    Arthritis Mother          Rheumitoid    Lipids Mother      Diabetes Father      Other (Other) Father          Kidney faliure    No Known Problems Sister      No Known Problems Brother      No Known Problems Brother  No Known Problems Brother      No Known Problems Daughter      No Known Problems Daughter      No Known Problems Daughter      Other (Other) Son          pulmonary    Diabetes Son          childhood   [5]   Social History  Tobacco Use    Smoking status: Never    Smokeless tobacco: Never   Substance Use Topics    Alcohol use: No    Drug use: No

## 2023-07-07 ENCOUNTER — Other Ambulatory Visit (HOSPITAL_BASED_OUTPATIENT_CLINIC_OR_DEPARTMENT_OTHER): Payer: Self-pay | Admitting: Internal Medicine

## 2023-07-18 ENCOUNTER — Encounter (HOSPITAL_BASED_OUTPATIENT_CLINIC_OR_DEPARTMENT_OTHER): Payer: Self-pay | Admitting: Adult Health

## 2023-07-18 ENCOUNTER — Ambulatory Visit (HOSPITAL_BASED_OUTPATIENT_CLINIC_OR_DEPARTMENT_OTHER): Admitting: Adult Health

## 2023-07-18 ENCOUNTER — Ambulatory Visit: Attending: Family Medicine

## 2023-07-18 VITALS — BP 124/82 | HR 77 | Temp 96.0°F | Resp 16 | Ht 64.0 in | Wt 176.0 lb

## 2023-07-18 LAB — COMPREHENSIVE METABOLIC PANEL
Adjusted Calcium: 9.1 mg/dL (ref 8.7–10.2)
Alanine Transferase (ALT): 14 U/L (ref 7–52)
Alb/Glob Ratio: 1.3 (ref 1.0–1.6)
Albumin: 4.2 g/dL (ref 3.5–5.7)
Alkaline Phosphatase (ALP): 64 U/L (ref 34–104)
Anion Gap: 5 mmol/L — ABNORMAL LOW (ref 7–15)
Aspartate Transaminase (AST): 16 U/L (ref 13–39)
BUN/ Creatinine: 21.1 (ref 7.3–21.7)
Bilirubin Total: 0.4 mg/dL (ref 0.3–1.0)
Calcium: 9.3 mg/dL (ref 8.6–10.3)
Carbon Dioxide Total: 28 mmol/L (ref 21–31)
Chloride: 104 mmol/L (ref 98–107)
Creatinine Serum: 0.71 mg/dL (ref 0.60–1.20)
Globulin: 3.2 g/dL (ref 2.2–4.2)
Glucose: 138 mg/dL — ABNORMAL HIGH (ref 74–109)
Potassium: 4.3 mmol/L (ref 3.5–5.1)
Protein: 7.4 g/dL (ref 6.4–8.9)
Sodium: 137 mmol/L (ref 136–145)
Urea Nitrogen, Blood (BUN): 15 mg/dL (ref 7–25)
eGFR Creatinine (Female): 99 mL/min/{1.73_m2} (ref >60)

## 2023-07-18 LAB — HEMOGLOBIN A1C
Hgb A1C,Glucose Est Avg: 171 mg/dL
Hgb A1C: 7.6 % — ABNORMAL HIGH (ref 4.0–5.6)

## 2023-07-18 LAB — LIPID PANEL
Cholesterol: 122 mg/dL (ref <200)
HDL Cholesterol: 36.7 mg/dL — ABNORMAL LOW (ref >=40.0)
LDL Cholesterol Calculation: 66 mg/dL (ref <100)
Non-HDL Cholesterol: 85.3 mg/dL (ref <=150.0)
Total Cholesterol: HDL Ratio: 3.3 mg/dL (ref 2.0–5.0)
Triglyceride Level: 96 mg/dL (ref <150)

## 2023-07-18 LAB — CBC WITH DIFFERENTIAL
Basophils % Auto: 0.8 % (ref 0.0–1.0)
Basophils Abs Auto: 0 10*3/uL (ref 0.0–0.1)
Eosinophils % Auto: 6.7 % — ABNORMAL HIGH (ref 0.0–4.0)
Eosinophils Abs Auto: 0.3 10*3/uL — ABNORMAL HIGH (ref 0.0–0.2)
Hematocrit: 37 % (ref 36.0–48.0)
Hemoglobin: 13.1 g/dL (ref 12.0–16.0)
Immature Granulocytes % Auto: 0.2 % (ref 0.00–0.50)
Immature Granulocytes Abs Auto: 0 10*3/uL (ref 0.0–0.0)
Lymphocytes % Auto: 29.5 % (ref 5.0–41.0)
Lymphocytes Abs Auto: 1.5 10*3/uL (ref 1.3–2.9)
MCH: 31.3 pg (ref 27.0–34.0)
MCHC g/dL: 35.4 g/dL (ref 33.0–37.0)
MCV: 88.3 fL (ref 82.0–97.0)
MPV: 10.9 fL (ref 9.4–12.4)
Monocytes % Auto: 7.8 % (ref 0.0–10.0)
Monocytes Abs Auto: 0.4 10*3/uL (ref 0.3–0.8)
Neutrophils % Auto: 55 % (ref 45.0–75.0)
Neutrophils Abs Auto: 2.81 10*3/uL (ref 2.20–4.80)
Nucleated Cell Count: 0 10*3/uL (ref 0.0–0.1)
Nucleated RBC/100 WBC: 0 %{WBCs} (ref <=0.0)
Platelet Count: 230 10*3/uL (ref 151–365)
RDW: 12.7 % (ref 11.5–14.5)
Red Blood Cell Count: 4.19 10*6/uL (ref 3.80–5.10)
White Blood Cell Count: 5.1 10*3/uL (ref 4.2–10.8)

## 2023-07-18 LAB — MMC THYROID PANEL
Thyroid Stimulating Hormone: 3.01 u[IU]/mL (ref 0.45–5.33)
Thyroxine, Free (Free T4): 0.86 ng/dL (ref 0.61–1.12)

## 2023-07-18 LAB — MMC VITAMIN B12 & FOLIC ACID
Folate: 13.1 ng/mL (ref >=5.9)
Vitamin B12: 488 pg/mL (ref 180.0–914.0)

## 2023-07-18 LAB — MICROALBUMIN
Creatinine Spot Urine: 82.72 mg/dL
Microalbumin Urine: 12 mg/L
Microalbumin/Creatinine Ratio: 15 mg/g (ref <=30)

## 2023-07-18 LAB — VITAMIN B12: Vitamin B12: 488 pg/mL (ref 180.0–914.0)

## 2023-07-18 NOTE — Patient Instructions (Signed)
I've ordered you nonfasting labs to have done today. The order is already at the [facility] lab, you no longer have to take a lab slip with you.     I have referred you to neurology and for nerve conduction studies. You should hear from their office to schedule an appointment within 2 weeks.  Please give me a call and let me know if you have not heard from them by that time.

## 2023-07-18 NOTE — Progress Notes (Signed)
Influenza Vaccination Screening  6 months and Older    Screen patients for eligibility and administration of influenza immunization.     Target Population  Patients who present in the clinic requesting Influenza Vaccination and are older than 6mos of age.    Procedure  MA receives a request from a patient/legal guardian for influenza vaccination and a current order exists within the patients chart.    Screening Checklist for Contraindications to Inactivated Injectable Influenza Vaccination  Ask the patient/parent the following questions:    1. Is the person to be vaccinated younger than 6 months? No   2. Has the person to be vaccinated already received a flu shot   for the 2024-2025 season? No     3. Is the person to be vaccinated running a fever greater than   101 degrees? No     4. Does the person to be vaccinated have an allergy to the vaccine No     5. Has the person to be vaccinated ever had a reaction to influenza   vaccine in the past? No     6. Has the person to be vaccinated ever had an illness that caused   paralysis? No     If the person answers NO to every question administer the following:  If the person answers YES or UNSURE to any question consult with the MD, NP or PA prior to administration.      Preference list order name  Description   Flu Vacc 6mos +  Single Dose 0.76ml prefilled syringe, IM

## 2023-07-18 NOTE — Progress Notes (Signed)
SUBJECTIVE:   Amanda Williamson is a 57yr year-old female new patient seen to establish care. Previous patient of Gordy Levan, [place]. She would also like to discuss Establish Care (TOC from [place]) and Hair Loss (Losing paths of hair since 05/2023)  .    Pertinent medical history includes:  NIDDM-currently managed on metformin 1000 mg twice daily.  Last A1c on file 03/24/2023 was 6.7.  CMP within normal limits except random glucose 142.  Last diabetic nephropathy screening 04/13/2022 was within normal limits.  She follows with [facility] for annual diabetic eye exam, due in December.  She does have diabetic peripheral neuropathy and is managed on gabapentin 400 mg three times day. Established care with podiatrist Dr. Eloisa Northern for diabetic foot screening/neuropathy on 07/06/2023 who recommended she pursue neurology workup due to severe symptomatology and changes in proprioception.  Metanx was added to her regimen at this time but she has not received it yet.  She has not yet been referred to neurology. She is not a vegetarian and does not drink alcohol. It started suddenly about two years ago with numbness and stiffness of all her toes and now goes up her legs just below the knee. She has difficulty sleeping due to this pain. Her feet used to turn purple but that has improved. She does have some calf cramping and tightness with exercise. There is no ABI on file.     Hypertension-currently managed on lisinopril 2.5 mg daily.    Hyperlipidemia-managed on simvastatin 10 mg daily.  Last lipid panel on file 04/13/2022 showed LDL 69, HDL 32, triglycerides 139.    Depression/anxiety- managed on Zoloft 100 mg daily with fair control of symptoms.    In July her hair started falling out in patches. She doesn't recall any preceding illnesses. It is starting to grow back.   ?  Medical history, family history, social history reviewed and updated.    ROS:  As per HPI.    MEDICATIONS:  Medications  Taking[1]?    ALLERGIES:  Allergies[2]    PAST MEDICAL HISTORY:  Past Medical History[3]    PAST SURGICAL HISTORY:  Past Surgical History[4]    SOCIAL HISTORY:  Social History     Socioeconomic History    Marital status: SINGLE     Spouse name: Not on file    Number of children: Not on file    Years of education: Not on file    Highest education level: Not on file   Occupational History    Not on file   Tobacco Use    Smoking status: Never    Smokeless tobacco: Never   Substance and Sexual Activity    Alcohol use: No    Drug use: No    Sexual activity: Not on file     Social History     Social History Narrative    Daughter Bridget Hartshorn. Retired [employer].       OBJECTIVE     VITAL SIGNS:  BP 124/82 (SITE: left arm, Orthostatic Position: sitting, Cuff Size: regular)   Pulse 77   Temp (!) 35.6 C (96 F) (Temporal)   Resp 16   Ht 1.626 m (5\' 4" )   Wt 79.8 kg (176 lb)   LMP 07/24/2016   SpO2 99%   BMI 30.21 kg/m     PHYSICAL EXAM:  Physical Exam  Constitutional:       Comments: Well-appearing, nontoxic pleasant 57 year old female in no acute distress.   Neck:  Comments: Supple, nontender.  Trachea is midline without thyromegaly or nodules palpated.  No carotid bruits auscultated.  No cervical, supraclavicular lymphadenopathy or tenderness.  Cardiovascular:      Comments: Rate and rhythm regular without MGR. DP pulses trace, symmetric bilaterally.  No lower extremity discoloration or ulceration today.  No varicosities noted.  Pulmonary:      Comments: Clear to auscultation bilaterally without rales, rhonchi, wheezing.  Musculoskeletal:      Comments: Sits comfortably during exam.   Skin:     General: Skin is warm and dry.      Comments: She brings me a picture of the hair loss she initially experienced which showed large circular patches of hair loss over the top of her scalp.  It has since filled in slightly.   Psychiatric:         Mood and Affect: Mood normal.         Behavior: Behavior normal.         Thought  Content: Thought content normal.         Judgment: Judgment normal.       ?  LABORATORY/IMAGING:     Lab Visit on 03/07/2023   Component Date Value Ref Range Status    Sodium 03/07/2023 134  134 - 143 mmol/L Final    Potassium 03/07/2023 4.1  3.5 - 5.2 mmol/L Final    Chloride 03/07/2023 103  99 - 109 mmol/L Final    Carbon Dioxide Total 03/07/2023 25  22 - 32 mmol/L Final    Anion Gap 03/07/2023 10  7 - 15 mmol/L Final    Urea Nitrogen, Blood (BUN) 03/07/2023 18  6 - 21 mg/dL Final    Creatinine Serum 03/07/2023 0.66  0.50 - 1.30 mg/dL Final    BUN/ Creatinine 03/07/2023 27.3 (H)  7.3 - 21.7 Final    Glucose 03/07/2023 142 (H)  70 - 99 mg/dL Final    Calcium 09/81/1914 9.3  8.7 - 10.2 mg/dL Final    Adjusted Calcium 03/07/2023 9.5  8.7 - 10.2 mg/dL Final    Protein 78/29/5621 7.7  5.9 - 8.2 g/dL Final    Albumin 30/86/5784 3.7  3.2 - 4.7 g/dL Final    Alkaline Phosphatase (ALP) 03/07/2023 57  38 - 126 U/L Final    Aspartate Transaminase (AST) 03/07/2023 19  9 - 44 U/L Final    Bilirubin Total 03/07/2023 0.6  0.1 - 2.2 mg/dL Final    Alanine Transferase (ALT) 03/07/2023 19  4 - 56 U/L Final    Globulin 03/07/2023 4.0  2.2 - 4.2 g/dL Final    Alb/Glob Ratio 03/07/2023 0.9 (L)  1.0 - 1.6 Final    eGFR Creatinine (Female) 03/07/2023 102  >60 mL/min/1.59m*2 Final    [facility] uses the 2021 CKD-EPI creatinine equation for estimated GFR (eGFR) that does not use a race coefficient.     For eGFR of 45-59 mL/min/1.44m2, guidelines recommend confirming current results with new values based on eGFR using both creatinine and cystatin C.     Cystatin C is recommended in the outpatient setting for the purpose of confirming chronic kidney disease.     The eGFR creatinine equation has not been validated on patients <17 years old and will not be performed.          Hgb A1C 03/07/2023 6.7 (H)  4.0 - 5.6 % Final    Hgb A1C,Glucose Est Avg 03/07/2023 146  mg/dL Final  ASSESSMENT/PLAN:   1. Type 2 diabetes  mellitus with obesity (HCC)  Stable with metformin, due for updated labs ordered as below.  Will follow-up for her annual diabetic eye exam in December.  - CBC with Differential; Future  - Comprehensive Metabolic Panel; Future  - Hemoglobin A1C; Future  - Microalbumin; Future    2. Diabetic polyneuropathy associated with type 2 diabetes mellitus (HCC)  Markedly symptomatic which started fairly abruptly 2 years ago.  She is doing fair on gabapentin.  I have recommended additional workup including vitamin B studies, nerve conduction studies, and an arterial Doppler.  She is referred to neurology per her podiatrist recommendations.  - NEUROLOGY CLINIC REFERRAL  - [facility] AND [facility] SPECIAL STUDIES  - Vitamin B12; Future  - Gabapentin (NEURONTIN) 400 mg Capsule; Take 1 capsule by mouth 2 times daily.  - Vitamin B1(Thiamine) Whole Bl; Future  - Vitamin B[6] (Pyridoxal 5-Phosphate); Future  - [facility] US ARTERIAL DUPLEX, BLE WITH ABI; Future    3. Claudication of both lower extremities (HCC)  The majority of her neuropathy symptoms seem to be at rest, but when queried she does complain of some claudication type symptoms in her calves with exercise.  Given her risk factor of diabetes I have recommended ABI to rule out circulatory abnormalities.  - [facility] US ARTERIAL DUPLEX, BLE WITH ABI; Future    4. Dyslipidemia  Doing well on simvastatin.  May consider switching over to atorvastatin.  Check updated lipid panel and arterial duplex, as per #3.  - Lipid Panel; Future    5. Hair loss  Allergy unclear.  Check vitamin levels as per #2 as well as TFTs.  [facility] Thyroid Panel; Future    6. GAD (generalized anxiety disorder)  7. Recurrent major depressive disorder, in full remission (HCC)  Stable with Zoloft, no acute concerns today.      Return in about 6 months (around 01/16/2024) for diabetes.    41 minutes were spent with the patient today (9 min pre-chart, 25 in exam room, 7 min post-chart) including obtaining history, performing examination,  counseling/communication with patient, chart review, documentation today.      Progress notes authenticated by Diamantina Monks, NP. Portions of this note were prepared with Paediatric nurse. Occasional phonetic and grammatical errors may have escaped proofreading.          [1]   Outpatient Medications Marked as Taking for the 07/18/23 encounter (Office Visit) with Arneta Cliche, NP   Medication Sig Dispense Refill    Acetaminophen (TYLENOL) 500 mg Tablet Take 1 tablet by mouth every 6 hours if needed for pain or Fever.      Gabapentin (NEURONTIN) 400 mg Capsule Take 1 capsule by mouth 2 times daily.      levomefol-B6-meB12-algal oil (METANX, ALGAL OIL,) 3 mg-35 mg-2 mg -90.314 mg Capsule Take 1 capsule by mouth 2 times daily. 60 capsule 3    Lisinopril (PRINIVIL, ZESTRIL) 2.5 mg Tablet Take 1 tablet by mouth every day. 90 tablet 1    Metformin (GLUCOPHAGE) 1,000 mg tablet Take 1 tablet by mouth 2 times daily with meals. 180 tablet 3    MULTIVITAMIN PO Take 1 tablet by mouth every morning. Twice a day      Psyllium Husk 0.52 gram Capsule Take 1 capsule by mouth every morning.      Sertraline (ZOLOFT) 100 mg Tablet Take 1 tablet by mouth every morning. 90 tablet 0    Simvastatin (ZOCOR) 10 mg  Tablet Take 1 tablet by mouth every day. 90 tablet 1    TURMERIC PO Take 1 capsule by mouth every day at bedtime.     [2] No Known Allergies  [3]   Past Medical History:  Diagnosis Date    Diabetes (HCC)     Erythema nodosum     H/O mammogram 09/10/2016    Kidney stones     PFS (patellofemoral syndrome)     Knee pain    PONV (postoperative nausea and vomiting)     RAD (reactive airway disease)    [4]   Past Surgical History:  Procedure Laterality Date    ------------OTHER------------- Right 02/2018    rotator cuff    COLONOSCOPY  07/08/2012    WNL    COLONOSCOPY  02/24/2015    ESOPHAGOGASTRODUODENOSCOPY (EGD)  02/24/2015    LIGATION, FALLOPIAN TUBE, BILATERAL, USING TUBAL RING  1994    PR ARTHRS KNEE  W/MENISCECTOMY MED&LAT W/SHAVING Right 12/24/2020    PR KNEE SCOPE,SHAVE ARTICULAR CART Right 12/24/2020

## 2023-07-18 NOTE — Addendum Note (Signed)
Addended by: Lailie Smead on: 07/18/2023 04:59 PM     Modules accepted: Orders

## 2023-07-20 ENCOUNTER — Encounter (HOSPITAL_BASED_OUTPATIENT_CLINIC_OR_DEPARTMENT_OTHER): Payer: Self-pay | Admitting: Adult Health

## 2023-07-22 NOTE — Progress Notes (Signed)
reviewed

## 2023-07-23 LAB — VITAMIN B1(THIAMINE) WHOLE BL: Vitamin B1(Thiamine) Whole Bl: 114 nmol/L (ref 70–180)

## 2023-07-23 LAB — VITAMIN B[6] (PYRIDOXAL 5-PHOSPHATE): Vitamin B6 (Pyridoxal 5-Phos): 27.6 nmol/L (ref 20.0–125.0)

## 2023-08-02 ENCOUNTER — Encounter (HOSPITAL_BASED_OUTPATIENT_CLINIC_OR_DEPARTMENT_OTHER): Payer: Self-pay

## 2023-08-08 ENCOUNTER — Ambulatory Visit
Admission: RE | Admit: 2023-08-08 | Discharge: 2023-08-08 | Disposition: A | Discharge status: Home / Self Care | Source: Ambulatory Visit | Attending: Adult Health | Admitting: Adult Health

## 2023-09-15 ENCOUNTER — Other Ambulatory Visit (HOSPITAL_BASED_OUTPATIENT_CLINIC_OR_DEPARTMENT_OTHER): Payer: Self-pay | Admitting: Family Medicine

## 2023-09-15 DIAGNOSIS — F411 Generalized anxiety disorder: Secondary | ICD-10-CM

## 2023-09-15 NOTE — Telephone Encounter (Signed)
Last refill: 06/08/2023     Recent Visits  Date Type Provider Dept   07/18/23 Office Visit Arneta Cliche, NP [facility]   03/30/23 Telephone Lewkowitz, Elmarie Mainland, PA [facility]   03/24/23 Telephone Davonna Belling, Elmarie Mainland, [place] [facility]   03/11/23 Office Visit Audrea Muscat, [place] [facility]   03/04/23 Telephone Davonna Belling, Elmarie Mainland, [place] [facility]   11/02/22 Office Visit Davonna Belling, Elmarie Mainland, [place] [facility]   Showing recent visits within past 365 days and meeting all other requirements  Future Appointments  Date Type Provider Dept   01/18/24 Appointment Arneta Cliche, NP [facility]   Showing future appointments within next 180 days and meeting all other requirements     Psychiatric Medication               Gabapentin (NEURONTIN) 400 mg Capsule Take 1 capsule by mouth 2 times daily.    Sertraline (ZOLOFT) 100 mg Tablet Take 1 tablet by mouth every morning.

## 2023-09-23 ENCOUNTER — Encounter (HOSPITAL_BASED_OUTPATIENT_CLINIC_OR_DEPARTMENT_OTHER): Payer: Self-pay

## 2023-10-12 ENCOUNTER — Ambulatory Visit (HOSPITAL_BASED_OUTPATIENT_CLINIC_OR_DEPARTMENT_OTHER): Admitting: Primary Podiatric Medicine

## 2023-10-19 ENCOUNTER — Other Ambulatory Visit (HOSPITAL_BASED_OUTPATIENT_CLINIC_OR_DEPARTMENT_OTHER): Payer: Self-pay | Admitting: Adult Health

## 2023-10-19 DIAGNOSIS — E785 Hyperlipidemia, unspecified: Secondary | ICD-10-CM

## 2023-10-19 DIAGNOSIS — I10 Essential (primary) hypertension: Secondary | ICD-10-CM

## 2023-10-19 NOTE — Telephone Encounter (Signed)
BP Readings from Last 3 Encounters:   07/18/23 124/82   07/06/23 122/64   04/27/23 103/62      Pulse Readings from Last 3 Encounters:   07/18/23 77   07/06/23 80   04/27/23 71     Lab Results   Component Value Date    NA 137 07/18/2023    K 4.3 07/18/2023    CR 0.71 07/18/2023    LDLC 66 07/18/2023    HDL 36.7 (L) 07/18/2023     Recent Visits  Date Type Provider Dept   07/18/23 Office Visit Arneta Cliche, NP [facility]   03/30/23 Telephone Lewkowitz, Elmarie Mainland, PA [facility]   03/24/23 Telephone Lewkowitz, Elmarie Mainland, PA [facility]   03/11/23 Office Visit Lewkowitz, Elmarie Mainland, [place] [facility]   03/04/23 Telephone Davonna Belling, Elmarie Mainland, [place] [facility]   11/02/22 Office Visit Lewkowitz, Elmarie Mainland, PA [facility]   Showing recent visits within past 365 days and meeting all other requirements  Future Appointments  Date Type Provider Dept   01/18/24 Appointment Arneta Cliche, NP [facility]   Showing future appointments within next 180 days and meeting all other requirements    Cardiac Medications in Epic (for confirmation)     Heart Failure Medication               Lisinopril (PRINIVIL, ZESTRIL) 2.5 mg Tablet Take 1 tablet by mouth every day.           Hypertensive Medication               Lisinopril (PRINIVIL, ZESTRIL) 2.5 mg Tablet Take 1 tablet by mouth every day.           Lipid Medication               Simvastatin (ZOCOR) 10 mg Tablet Take 1 tablet by mouth every day.           Lab Results   Component Value Date    AST 16 07/18/2023    ALT 14 07/18/2023    CHOL 122 07/18/2023    LDLC 66 07/18/2023    LDLC 69 04/13/2022    HDL 36.7 (L) 07/18/2023    TRIG 96 07/18/2023   Lipid Medications in Epic (for confirmation)  Lipid Medication               Simvastatin (ZOCOR) 10 mg Tablet Take 1 tablet by mouth every day.

## 2023-11-07 ENCOUNTER — Encounter (HOSPITAL_BASED_OUTPATIENT_CLINIC_OR_DEPARTMENT_OTHER): Payer: Self-pay | Admitting: Adult Health

## 2023-11-11 ENCOUNTER — Other Ambulatory Visit (HOSPITAL_BASED_OUTPATIENT_CLINIC_OR_DEPARTMENT_OTHER): Payer: Self-pay | Admitting: Adult Health

## 2023-11-11 DIAGNOSIS — E669 Obesity, unspecified: Secondary | ICD-10-CM

## 2023-11-11 DIAGNOSIS — E1142 Type 2 diabetes mellitus with diabetic polyneuropathy: Secondary | ICD-10-CM

## 2023-11-11 MED ORDER — GABAPENTIN 400 MG CAPSULE
400.0000 mg | ORAL_CAPSULE | Freq: Two times a day (BID) | ORAL | 0 refills | Status: DC
Start: 2023-11-11 — End: 2024-06-07

## 2023-11-11 MED ORDER — METFORMIN 1,000 MG TABLET
1000.0000 mg | ORAL_TABLET | Freq: Two times a day (BID) | ORAL | 3 refills | Status: AC
Start: 2023-11-11 — End: 2024-11-05

## 2023-11-11 NOTE — Telephone Encounter (Signed)
LOV:07/18/2023  NOV:01/18/2024        Please verify correct pharmacy  Please ensure 90 day supply for mail order pharmacies.  Please verify prescription days equals quantity x refills  BP Readings from Last 1 Encounters:   07/18/23 124/82      Pulse Readings from Last 1 Encounters:   07/18/23 77     Lab Results   Component Value Date    WBC 5.1 07/18/2023    HGB 13.1 07/18/2023    PLT 230 07/18/2023    NA 137 07/18/2023    K 4.3 07/18/2023    GLU 138 (H) 07/18/2023    CR 0.71 07/18/2023    ALT 14 07/18/2023     Recent Visits  Date Type Provider Dept   07/18/23 Office Visit Arneta Cliche, NP [facility]   03/30/23 Telephone Lewkowitz, Elmarie Mainland, PA [facility]   03/24/23 Telephone Lewkowitz, Elmarie Mainland, PA [facility]   03/11/23 Office Visit Lewkowitz, Elmarie Mainland, [place] [facility]   03/04/23 Telephone Lewkowitz, Elmarie Mainland, PA [facility]   Showing recent visits within past 365 days and meeting all other requirements  Future Appointments  Date Type Provider Dept   01/18/24 Appointment Arneta Cliche, NP [facility]   Showing future appointments within next 180 days and meeting all other requirements        CURES Audit Trail           User Date Status   Junious Dresser 10/21/2020 12:14 PM  10/29/2020  4:54 PM  12/05/2020 12:43 PM Reviewed PDMP [1]  Reviewed PDMP [1]  Reviewed PDMP [1]               Current Medications  has a current medication list which includes the following prescription(s): acetaminophen, azelastine nasal, gabapentin, levomefol-b6-meb12-algal oil, lisinopril, metformin, multivitamin, psyllium husk, sertraline, simvastatin, and turmeric.

## 2023-11-23 ENCOUNTER — Telehealth (HOSPITAL_BASED_OUTPATIENT_CLINIC_OR_DEPARTMENT_OTHER): Payer: Self-pay | Admitting: Adult Health

## 2023-11-23 NOTE — Telephone Encounter (Signed)
 Patient was informed.

## 2023-11-23 NOTE — Telephone Encounter (Signed)
-----   Message from Diamantina Monks sent at 11/23/2023  9:05 AM PST -----  Please inform patient of referral update in regards to MRI. We will wait on MRI for now pending [facility] response.  ----- Message -----  From: Providence Crosby  Sent: 11/17/2023   9:52 AM PST  To: Arneta Cliche, NP; #    Hello Nedra Hai, I just called [facility] and they haven't even entered the referral into their system yet. I think the patient is getting it confused with her Neurology referral, or [facility] is getting it confused with that. My guess is they were referring to the Neurology referral because it was the only one in their system at that time. I spoke to Aundra Millet D at [facility] and she said she is going to enter the referral right now and send it to the team for the [facility].    Thank you,  Gunnar Bulla  Referral Coordinator  [facility]  Ph [phone]  Fax [phone]  ----- Message -----  From: Arneta Cliche, NP  Sent: 11/16/2023  10:20 AM PST  To: [facility]    So now it looks like they are saying she needs an MRI before the nerve conduction studies?  Can you confirm? Thx  ----- Message -----  From: Alinda Money, MA  Sent: 11/15/2023   8:27 AM PST  To: Carlean Jews; Arneta Cliche, NP; #    11/14/2023  3:54 PM Carlean Jews     Pt called, she was told that she has to have an MRI before [facility] can see her for anything. -  ----- Message -----  From: Carlean Jews  Sent: 11/14/2023   3:54 PM PST  To: [facility]    MRI

## 2023-12-07 ENCOUNTER — Other Ambulatory Visit (HOSPITAL_BASED_OUTPATIENT_CLINIC_OR_DEPARTMENT_OTHER): Payer: Self-pay | Admitting: Adult Health

## 2023-12-07 DIAGNOSIS — E1142 Type 2 diabetes mellitus with diabetic polyneuropathy: Secondary | ICD-10-CM

## 2023-12-07 NOTE — Telephone Encounter (Signed)
 Too soon. 6 month supply sent 2.7.25.    Gabapentin (NEURONTIN) 400 mg Capsule [[id number]]    Order Details  Dose: 400 mg Route: ORAL Frequency: TWO TIMES DAILY   Dispense Quantity: 360 capsule Refills: 0          Sig: Take 1 capsule by mouth 2 times daily.         Start Date: 11/11/23 End Date: 05/09/24 after 360 doses   Written Date: 11/11/23 Expiration Date: 11/10/24       Associated Diagnoses: Diabetic polyneuropathy associated with type 2 diabetes mellitus (HCC) [E11.42]   Original Order: Gabapentin (NEURONTIN) 400 mg Capsule [[id number]]   Providers    Authorizing Provider:  Aneta Mins, NP  [address], [place] [address]  Phone: [phone]   Fax: [phone]  DEA #: [id number]   NPI: [id number]        Ordering User: Aneta Mins, NP          Pharmacy    [facility] [phone] [facility] [place], [place] - [address] [place], [phone] [facility] [phone] FX  [address] Bernadene Person [place] [address]  Phone: [phone]  Fax: [phone]  DEA #: [id number]

## 2023-12-19 ENCOUNTER — Telehealth (HOSPITAL_BASED_OUTPATIENT_CLINIC_OR_DEPARTMENT_OTHER): Payer: Self-pay | Admitting: Adult Health

## 2023-12-19 NOTE — Telephone Encounter (Signed)
 Pt is requesting a referral to a therapist that takes [facility], she is having a lot of anxiety.

## 2023-12-19 NOTE — Telephone Encounter (Signed)
 Called patient and let her know she does not need a referral for therapist. She just need to call her insurance to ask for names that she can see.

## 2023-12-21 ENCOUNTER — Encounter (HOSPITAL_BASED_OUTPATIENT_CLINIC_OR_DEPARTMENT_OTHER): Payer: Self-pay

## 2024-01-13 ENCOUNTER — Ambulatory Visit (HOSPITAL_BASED_OUTPATIENT_CLINIC_OR_DEPARTMENT_OTHER): Payer: Self-pay | Admitting: Adult Health

## 2024-01-13 ENCOUNTER — Ambulatory Visit: Attending: Adult Health

## 2024-01-13 LAB — HEMOGLOBIN A1C
Hgb A1C,Glucose Est Avg: 166 mg/dL
Hgb A1C: 7.4 % — ABNORMAL HIGH (ref 4.0–5.6)

## 2024-01-16 ENCOUNTER — Other Ambulatory Visit (HOSPITAL_BASED_OUTPATIENT_CLINIC_OR_DEPARTMENT_OTHER): Admitting: PHYSICIAN ASSISTANT

## 2024-01-16 ENCOUNTER — Encounter (HOSPITAL_BASED_OUTPATIENT_CLINIC_OR_DEPARTMENT_OTHER)

## 2024-01-16 ENCOUNTER — Ambulatory Visit (HOSPITAL_BASED_OUTPATIENT_CLINIC_OR_DEPARTMENT_OTHER): Admitting: PHYSICIAN ASSISTANT

## 2024-01-16 VITALS — Temp 96.6°F | Ht 64.0 in | Wt 176.0 lb

## 2024-01-16 DIAGNOSIS — R52 Pain, unspecified: Secondary | ICD-10-CM

## 2024-01-16 NOTE — Progress Notes (Signed)
 CHIEF COMPLAINT:    Chief Complaint   Patient presents with    Knee Problem     Right knee pain       HISTORY OF PRESENT ILLNESS:   History of Present Illness  Amanda Williamson is a 58 year old female with diabetes and neuropathy who presents with ongiong and worsening right knee pain and difficulty walking.    She experiences persistent knee pain that has not improved since her last visit. The pain worsens with weight-bearing activities such as walking, standing, and climbing stairs, and she is unable to kneel due to the pain. She uses a knee brace, which she describes as flimsy, and applies Voltaren gel for relief. She also elevates her leg and uses ice. Physical therapy with John Wu provided some relief, particularly focusing on her hip due to weakness and tightness affecting her knee.    Neuropathy complicates her walking, related to her diabetes and affecting her feet. She experiences sharp, achy pain and tightness in her knee, which she describes as feeling like it 'separates' and 'tightens'. The pain radiates to her hip and buttock area, likely due to her altered gait.    She is currently taking gabapentin and metformin. She tries to avoid taking Tylenol unless necessary and avoids NSAIDs due to her diabetes and potential kidney concerns.    No recent injuries or falls. Reports pain in the knee, hip, and buttock area. No pain in the back. Reports cracking in the knee when standing.          PAST MEDICAL HISTORY: Past Medical History[1]    MEDICATIONS: Current Medications[2]    ALLERGIES: Patient has no known allergies.    PAST SURGICAL HISTORY: Past Surgical History[3]    SOCIAL HISTORY:   Social History     Socioeconomic History    Marital status: SINGLE     Spouse name: Not on file    Number of children: Not on file    Years of education: Not on file    Highest education level: Not on file   Occupational History    Not on file   Tobacco Use    Smoking status: Never    Smokeless tobacco: Never   Substance and  Sexual Activity    Alcohol use: No    Drug use: No    Sexual activity: Not on file   Other Topics Concern    Not on file   Social History Narrative    Daughter Wyman Heart. Retired [employer].     Social Drivers of Psychologist, prison and probation services Strain: Not on File (05/18/2020)    Received from [employer], [employer]     Financial Resource Strain: 0   Food Insecurity: Not on File (06/30/2023)    Received from [employer] Insecurity     Food: 0   Transportation Needs: Not on File (05/18/2020)    Received from [place], [employer] Needs     Transportation: 0   Physical Activity: Not on File (05/18/2020)    Received from [place], [place]    Physical Activity     Physical Activity: 0   Stress: Not on File (05/18/2020)    Received from [place], [place]    Stress     Stress: 0   Social Connections: Not on File (06/16/2023)    Received from [employer]     Connectedness: 0   Intimate  Partner Violence: Not on file   Housing Stability: Not on File (05/18/2020)    Received from [place], [employer] Stability     Housing: 0       FAMILY HISTORY: Family History[4]    ROS: A 14 point review of systems is reviewed and is negative with the exception of what is listed in the HPI.    Vitals:    01/16/24 0925   Temp: (!) 35.9 C (96.6 F)   TempSrc: Temporal   Weight: 79.8 kg (176 lb)   Height: 1.626 m (5\' 4" )     Body mass index is 30.21 kg/m.    PHYSICAL EXAMINATION  GENERAL: Well appearing, in no apparent distress.  PSYCHOLOGICAL: Alert and oriented x3, mood and affect appropriate for visit.  EYES: Extraocular movements are intact, no scleral icterus.  NEUROLOGICAL: CN II-XII grossly intact.  SKIN: Skin overlying the injured/operative extremity shows no signs of skin changes or rashes.  CARDIOVASCULAR: The patient has a 2+ radial pulse, symmetric bilaterally.  MUSCULOSKELETAL:    Right Knee:  + mild effusion  ROM: 0/2/120  Strength: 5/5 flexion, 5/5 extension  + Patellar Grind  (-) Varus/valgus  +  McMurrays  (-) Lachmans  Nvi Distally        DIAGNOSTIC IMAGING: 3 views of the right knee were obtained in clinic today which show joint space narrowing worse in the medial compartment with evidence of osteophyte formation and subchondral sclerosis.    IMPRESSION:    Assessment & Plan  Acute on chronic right knee pain    She experiences acute on chronic right knee pain, worsened by weight-bearing activities, due to a meniscus tear and arthritis confirmed by MRI. She uses a knee brace and Voltaren gel for symptom relief. Physical therapy focusing on hip and knee strengthening has been beneficial. Conservative management is prioritized, but surgery may be necessary if these measures fail. Refer to physical therapy with Cala Bradford at [facility] in [place] for hip and knee rehabilitation. Provide a more robust knee brace. Continue Voltaren gel and ice for pain management. Consider future knee injections. Discuss potential surgery if conservative measures fail.    Diabetic neuropathy    Neuropathy, likely secondary to diabetes, complicates her mobility and contributes to difficulty in managing knee pain.    Follow-up    Ongoing monitoring and follow-up are required to assess treatment effectiveness and make necessary adjustments. A temporary handicap placard is provided for mobility challenges. Schedule follow-up appointment in 8 weeks to reassess knee pain and treatment efficacy. Provide temporary handicap placard for easier mobility.      PLAN:  Follow up in 8 weeks for re-evaluation     Leroy Sea PA-C  [facility]  [address] [place], [place]. [address] [place], [place] [address]  [phone]    I obtained verbal consent from the patient or the patient's representative and any other participants to use AI ambient technology to transcribe the interactions between the patient and myself during the clinical encounter. I read and reviewed the draft note.      This note was generated using  Programmer, multimedia.  The best effort was made to avoid grammatical errors, but they still may exist.       [1]   Past Medical History:  Diagnosis Date    Diabetes (HCC)     Erythema nodosum     H/O mammogram 09/10/2016    Kidney stones  PFS (patellofemoral syndrome)     Knee pain    PONV (postoperative nausea and vomiting)     RAD (reactive airway disease)    [2] Current Outpatient Medications:   Acetaminophen (TYLENOL) 500 mg Tablet, Take 1 tablet by mouth every 6 hours if needed for pain or Fever., Disp: , Rfl:   Azelastine Nasal (ASTELIN) 137 mcg (0.1 %) Spray, INSTILL ONE SPRAY INTO EACH NOSTRIL TWO TIMES DAILY., Disp: 90 mL, Rfl: 3  Gabapentin (NEURONTIN) 400 mg Capsule, Take 1 capsule by mouth 2 times daily., Disp: 360 capsule, Rfl: 0  levomefol-B6-meB12-algal oil (METANX, ALGAL OIL,) 3 mg-35 mg-2 mg -90.314 mg Capsule, Take 1 capsule by mouth 2 times daily., Disp: 60 capsule, Rfl: 3  Lisinopril (PRINIVIL, ZESTRIL) 2.5 mg Tablet, Take 1 tablet by mouth every day., Disp: 90 tablet, Rfl: 3  Metformin (GLUCOPHAGE) 1,000 mg tablet, Take 1 tablet by mouth 2 times daily with meals., Disp: 180 tablet, Rfl: 3  MULTIVITAMIN PO, Take 1 tablet by mouth every morning. Twice a day, Disp: , Rfl:   Psyllium Husk 0.52 gram Capsule, Take 1 capsule by mouth every morning., Disp: , Rfl:   Sertraline (ZOLOFT) 100 mg Tablet, TAKE 1 TABLET BY MOUTH EVERY DAY IN THE MORNING, Disp: 90 tablet, Rfl: 3  Simvastatin (ZOCOR) 10 mg Tablet, Take 1 tablet by mouth every day., Disp: 90 tablet, Rfl: 3  TURMERIC PO, Take 1 capsule by mouth every day at bedtime., Disp: , Rfl:   [3]   Past Surgical History:  Procedure Laterality Date    ------------OTHER------------- Right 02/2018    rotator cuff    COLONOSCOPY  07/08/2012    WNL    COLONOSCOPY  02/24/2015    ESOPHAGOGASTRODUODENOSCOPY (EGD)  02/24/2015    LIGATION, FALLOPIAN TUBE, BILATERAL, USING TUBAL RING  1994    PR ARTHRS KNEE W/MENISCECTOMY MED&LAT W/SHAVING Right  12/24/2020    PR KNEE SCOPE,SHAVE ARTICULAR CART Right 12/24/2020   [4]   Family History  Problem Relation Name Age of Onset    Arthritis Mother          Rheumitoid    Lipids Mother      Diabetes Father      Other (Other) Father          Kidney faliure    No Known Problems Sister      No Known Problems Brother      No Known Problems Brother      No Known Problems Brother      No Known Problems Daughter      No Known Problems Daughter      No Known Problems Daughter      Other (Other) Son          pulmonary    Diabetes Son          childhood

## 2024-01-18 ENCOUNTER — Encounter (HOSPITAL_BASED_OUTPATIENT_CLINIC_OR_DEPARTMENT_OTHER): Payer: Self-pay | Admitting: Adult Health

## 2024-01-18 ENCOUNTER — Ambulatory Visit (HOSPITAL_BASED_OUTPATIENT_CLINIC_OR_DEPARTMENT_OTHER): Admitting: Adult Health

## 2024-01-18 ENCOUNTER — Encounter (HOSPITAL_BASED_OUTPATIENT_CLINIC_OR_DEPARTMENT_OTHER): Payer: Self-pay | Admitting: PHYSICIAN ASSISTANT

## 2024-01-18 VITALS — BP 124/80 | HR 80 | Temp 96.6°F | Ht 64.0 in | Wt 179.2 lb

## 2024-01-18 MED ORDER — OZEMPIC 0.25 MG OR 0.5 MG (2 MG/3 ML) SUBCUTANEOUS PEN INJECTOR: PEN_INJECTOR | SUBCUTANEOUS | 0 refills | Status: AC

## 2024-01-18 MED ORDER — METRONIDAZOLE 1 % TOPICAL GEL: Freq: Every day | TOPICAL | 0 refills | Status: DC

## 2024-01-18 NOTE — Progress Notes (Signed)
 reviewed

## 2024-01-18 NOTE — Patient Instructions (Addendum)
 I've ordered you nonfasting labs to have done in early July. The order is already at the [facility] lab, you no longer have to take a lab slip with you.      I recommend the following counselors:    Tauna Farrow, LMFT  [address]  [phone]  [phone]    Jayann Askin, LMFT  [address]  [place]  [phone]  [phone]    Lewanda Rebel, LMFT  [address]  [place]  [phone]  [phone]    Kerman Peck, PhD  [address]  [place]  [phone]  [phone]    Stormy Elliot, PhD  [address]  [place]  [phone]  [phone]  [phone]    Legrand Puma (Drier), Psy.D (specialist in brainspotting)  [address]  [place], [place]   [phone]    Vonn Guard, LMFT  [address]  [employer], [place]   [phone]    Esau Heckle, LMFT  [address] [place]   [phone]    Bartholomew Boros [place], LCSW  [address]  [place], [place]  [phone]    Harlie Lieu, LMFT (specialist in brainspotting)  [address]  [place], [place]  [phone]    Joe Murders, LMFT  [address]  [place], [place]   [phone]    [facility] mental health outpatient  [address]  [place], [place]   [phone]    Pablo Boards (teen groups)  [facility], [place]   [phone]    [facility]  [place], [place]  [phone]    Kenn Payment, LMFT  [facility] [place], [place]  [phone]    Edyth Grana, PhD  [address]  [place], [place] [address]  [phone]    Nunzio Belch, LCSW  [address] [place], [place] [address]  [phone]    Welford Haley, LMFT  [address] [place]   [phone]    [facility]  Shawnee Dellen, MSW, LCSW  [place], [place]  [address] [place]   [phone]    Chanda Combes, PhD (Takes Medicare, [facility])  Clinical  Psychologist  [phone]. [address]  [place], [place] [address]  [phone]    [email or web address]  Virtual counseling, cash pay. Can switch providers anytime no cost.    [employer]ami[employer]  Free web-based resources for mental health

## 2024-01-18 NOTE — Progress Notes (Signed)
 SUBJECTIVE:   Amanda Williamson is a 58yr year-old female here to discuss Anxiety (Discuss therapy), Diabetes (Follow up), Rash (Red flare up on face, under eyes), and New Medication Request (Weight loss medication)    She is seen in 33-month follow-up of multiple medical issues.    NIDDM with polyneuropathy-currently managed on metformin 1000 mg twice daily.  It was recommended we start Jardiance due to elevated A1c of 7.6 per 07/18/2023 labs, but patient preferred to manage with current regimen and diet changes.  01/13/2024 A1c was 7.4, down from 7.6 per 07/18/2023 labs.  She had a diabetic eye exam December 2024, within normal limits. In terms of her polyneuropathy she had an ABI 08/08/2023:    IMPRESSION: Normal bilateral lower extremity arterial flow evaluation with normal ABIs.     She was referred to neurology with previsit EMG/NCV studies 07/18/2023.  There has been a lot of problems and confusion with that referral.  She is now scheduled for EMG/NCS 03/21/24.  We are waiting on the neurology referral pending those studies.  She had normal vitamin B-12, B6, thyroid panel 07/18/2023.  She is followed by podiatry for diabetic foot screening/neuropathy, last seen 07/06/2023 with 36-month follow-up recommended; not yet scheduled.  She continues on gabapentin twice daily which is helping. She was taking it 3x a day but it was too much. She was prescribed Metanx but not covered and never took it.    She would like to discuss GLP-1 medications for weight loss.     Hypertension-currently managed on lisinopril 2.5 mg daily.     Hyperlipidemia-managed on simvastatin 10 mg daily.  Last lipid panel on file 04/13/2022 showed LDL 69, HDL 32, triglycerides 139.     Depression/anxiety- managed on Zoloft 100 mg daily. Her anxiety is not doing well and she's been trying to get a counselor unsuccessfully. She is requesting a referral. She attributes her worsening anxiety to life stressors.     She noticed a rash on her cheeks starting  about December.  It is an erythematous flushing type of rash associated with very mild acne.  It seems to be persistent.  She has no symmetric arthralgias or swelling.  She has no personal or family history of autoimmune illness.    She would also like to see a dermatologist to have her right thumbnail removed.  It is deformed and painful.  She has been filing it down and using topicals for over a year without improvement.    In terms of healthcare maintenance she is due for an annual wellness visit.    Medical history, family history, social history reviewed and updated.    ROS:  As per HPI.    MEDICATIONS:  Medications Taking[1]?    ALLERGIES:  Allergies[2]    PAST MEDICAL HISTORY:  Past Medical History[3]    PAST SURGICAL HISTORY:  Past Surgical History[4]    SOCIAL HISTORY:  Tobacco Use History[5]  Social History     Substance and Sexual Activity   Alcohol Use No     Social History     Substance and Sexual Activity   Drug Use No       OBJECTIVE     VITAL SIGNS:  BP 124/80 (SITE: left arm, Orthostatic Position: sitting, Cuff Size: regular)   Pulse 80   Temp (!) 35.9 C (96.6 F) (Temporal)   Ht 1.626 m (5\' 4" )   Wt 81.3 kg (179 lb 3.2 oz)   LMP 07/24/2016   SpO2 100%  BMI 30.76 kg/m     PHYSICAL EXAM: Physical Exam  Constitutional:       Comments: Well-appearing, nontoxic pleasant 58 year old female in no acute distress   Musculoskeletal:      Comments: Sits comfortably during exam.   Skin:     General: Skin is warm and dry.      Comments: She has a faint erythematous rash over the nose and through the cheeks associated with some very mild acne.  Her right thumbnail is significantly deformed without acute inflammation or erythema.   Psychiatric:         Mood and Affect: Mood normal.         Behavior: Behavior normal.         Thought Content: Thought content normal.         Judgment: Judgment normal.       ?  LABORATORY/IMAGING:     Lab Visit on 01/13/2024   Component Date Value Ref Range Status    Hgb  A1C 01/13/2024 7.4 (H)  4.0 - 5.6 % Final    Hgb A1C,Glucose Est Avg 01/13/2024 166  mg/dL Final           ASSESSMENT/PLAN:   1. Type 2 diabetes mellitus with obesity (HCC)  2. Obesity (BMI 30.0-34.9)  Her diabetes is not to goal.  I had like to see her A1c under 7.  She is interested in starting GLP-1 and I am in agreement. Common side effects discussed including nausea, diarrhea.  Discussed rare but serious side effect of pancreatitis and to follow-up here or ER should this occur.  We also discussed this appears that it is a long-term medication and might be a lifelong commitment to maintain weight loss.  Verbalizes understanding would like to proceed.  We will start low-dose 0.25 mg weekly, increasing to 0.5 mg after 4 weeks.  For now she will continue her metformin but it is possible in the future we could discontinue this with improved control.  If semaglutide is cost prohibitive, consider Jardiance.  She will schedule podiatry follow-up at her earliest convenience.  Recheck labs for 60-month follow-up.  - CBC with Differential; Future  - Comprehensive Metabolic Panel; Future  - Hemoglobin A1C; Future  - semaglutide (OZEMPIC) 0.25 mg or 0.5 mg (2 mg/3 mL) Pen Injector; Inject 0.25 mg subcutaneously one time each week for 28 days, THEN 0.5 mg one time each week. Start 0.25 mg weekly for 4 weeks, increasing to 0.5 mg weekly thereafter.  Dispense: 9 mL; Refill: 0    3. Diabetic polyneuropathy associated with type 2 diabetes mellitus [facility])  She has EMG/nerve conduction studies pending in June.  We will follow-up pending those studies.    4. Primary hypertension  Well-controlled with current regimen, no changes made today.    5. Dyslipidemia  Stable with simvastatin.  Check labs for 69-month follow-up.  - Lipid Panel; Future    6. GAD (generalized anxiety disorder)  Not to goal.  She would like to establish with a counselor and a list of local and telehealth counselors is given.    7. Malar rash  Suspect rosacea,  trial of MetroGel, as below.  Check labs to rule out autoimmune related rash.  - Anti-Nuclear Ab Screen (ANA); Future  - C-Reactive Protein; Future  - Sed Rate Westergren; Future  - Rheumatoid Factor; Future  - Metronidazole (METROGEL) 1 % gel; Apply to the affected area every day. Use a thin layer to affected areas after washing  Dispense:  45 g; Refill: 0    8. Fingernail abnormalities  This is a chronic problem not responding to topical treatments or time.  She is requesting removal and referred to dermatology.  - DERMATOLOGY CLINIC REFERRAL         Return in about 3 months (around 04/18/2024) for AWV, diabetes- .    Medical Decision Making:  Number of Problems: Moderate: 1 or more chronic problem with exacerbation/progression OR 2 stable OR one new problem with uncertain prognosis   Data Reviewed: Any 3: as above, see orders (if any)  Risk of morbidity for testing/treatment: Moderate risk: Prescription drug management        Progress notes authenticated by Diamantina Monks, NP. Portions of this note were prepared with Paediatric nurse. Occasional phonetic and grammatical errors may have escaped proofreading.          [1]   Outpatient Medications Marked as Taking for the 01/18/24 encounter (Office Visit) with Arneta Cliche, NP   Medication Sig Dispense Refill    Acetaminophen (TYLENOL) 500 mg Tablet Take 650 mg by mouth every 6 hours if needed for pain or Fever.      Gabapentin (NEURONTIN) 400 mg Capsule Take 1 capsule by mouth 2 times daily. 360 capsule 0    Lisinopril (PRINIVIL, ZESTRIL) 2.5 mg Tablet Take 1 tablet by mouth every day. 90 tablet 3    Metformin (GLUCOPHAGE) 1,000 mg tablet Take 1 tablet by mouth 2 times daily with meals. 180 tablet 3    Metronidazole (METROGEL) 1 % gel Apply to the affected area every day. Use a thin layer to affected areas after washing 45 g 0    MULTIVITAMIN PO Take 1 tablet by mouth every morning. Twice a day      Psyllium Husk 0.52 gram Capsule Take 1  capsule by mouth every morning.      semaglutide (OZEMPIC) 0.25 mg or 0.5 mg (2 mg/3 mL) Pen Injector Inject 0.25 mg subcutaneously one time each week for 28 days, THEN 0.5 mg one time each week. Start 0.25 mg weekly for 4 weeks, increasing to 0.5 mg weekly thereafter. 9 mL 0    Sertraline (ZOLOFT) 100 mg Tablet TAKE 1 TABLET BY MOUTH EVERY DAY IN THE MORNING 90 tablet 3    Simvastatin (ZOCOR) 10 mg Tablet Take 1 tablet by mouth every day. 90 tablet 3    TURMERIC PO Take 1 capsule by mouth every day at bedtime.     [2] No Known Allergies  [3]   Past Medical History:  Diagnosis Date    Diabetes (HCC)     Erythema nodosum     H/O mammogram 09/10/2016    Kidney stones     PFS (patellofemoral syndrome)     Knee pain    PONV (postoperative nausea and vomiting)     RAD (reactive airway disease)    [4]   Past Surgical History:  Procedure Laterality Date    ------------OTHER------------- Right 02/2018    rotator cuff    COLONOSCOPY  07/08/2012    WNL    COLONOSCOPY  02/24/2015    ESOPHAGOGASTRODUODENOSCOPY (EGD)  02/24/2015    LIGATION, FALLOPIAN TUBE, BILATERAL, USING TUBAL RING  1994    PR ARTHRS KNEE DEBRIDEMENT/SHAVING ARTCLR CRTLG Right 12/24/2020    PR ARTHRS KNEE W/MENISCECTOMY MED&LAT W/SHAVING Right 12/24/2020   [5]   Social History  Tobacco Use   Smoking Status Never   Smokeless Tobacco Never

## 2024-02-06 ENCOUNTER — Ambulatory Visit: Attending: PHYSICIAN ASSISTANT | Admitting: Rehabilitative and Restorative Service Providers"

## 2024-02-06 NOTE — Allied Health Progress (Signed)
 PHYSICAL THERAPY EVALUATION 02/06/2024     Diagnosis:  Therapy diagnosis:   M25.551 (ICD-10-CM) - Right hip pain   M17.11 (ICD-10-CM) - Primary osteoarthritis of right knee     Medical diagnosis:   M25.551 (ICD-10-CM) - Right hip pain   M17.11 (ICD-10-CM) - Primary osteoarthritis of right knee       Authorizing MD (First and last name) and PI#: Oral Billings PA  Onset Date:  12/02/2023    Start of Care Date: 02/06/2024   Prior Level of function: reports worsening bilateral hip and right knee pain since end of February this year.  Prior treatment for this diagnosis: yes  Rehab potential: Good    Precautions: peripheral neuropathy bilateral LE's    Patient's goals are to be able to walk with less hip and right knee pain.    Certification period end date (for Medicare patients): 05/06/2024  Estimated Discharge Date: 05/06/2024    Plan of care:  Established 02/06/2024   1 times a week for a total of 10 treatments.   To include   Manual therapy for soft tissue mobilization  Therapeutic exercises for stretches and strengthening exercises bilateral LE's  Home Exercise Program  Patient/Caregiver Education    Goals:   Patient will be able to walk 20-25 minutes with 3-4/10 right knee and bilateral hip pain at worst.  Patient will be able to ascend/descend 5 steps in and out of mobile home with 3-4/10 right knee and bilateral hip pain at worst.  Patient will be independent with home exercise program and home recommendations   Patient will be able to perform transfers sit to stand without use of hands with 3-4/10 right knee and bilateral hip pain at worst.          Total Evaluation time: 30 minutes   Treatment time in addition to evaluation: 30 minutes             Clinical Evaluation     SUBJECTIVE  History of current problem:                          Amanda Williamson is a 58yr old female who presents with a history of bilateral hip and knee pain. Pain has been worse since the end of February 2025. She complains of constant aching pain  over her right anterior,  lateral and medial knee rated 9/10 at worst and 4/10 at best. Her right knee feels unstable and wants to buckle at times. She has constant aching pain over her right buttocks, lateral hip and lateral thigh rated 8/10 at worst, 2-3/10 at best.    Aggravating factors include: walking 10 minutes, transfers sit to stand, prolonged sitting then getting up to walk, ascending and descending stairs ( also right knee buckles at times descending stairs)  Cannot tolerate right sidelying due to right hip pain.  Easing Factors include: ice gel topical, Voltaren gel        Medical History/Co-morbidities effecting the plan of care: diabetes mellitus type 2, peripheral neuropathy    Past Medical History:  No date: Diabetes (HCC)  No date: Erythema nodosum  09/10/2016: H/O mammogram  No date: Kidney stones  No date: PFS (patellofemoral syndrome)  No date: PONV (postoperative nausea and vomiting)  No date: RAD (reactive airway disease)    Past Surgical History[1]        Social History/Personal and/or environmental factors effecting the plan of care:   Lives alone in mobile home with  5 steps to enter.       OBJECTIVE EXAM:    Palpation:   Tender over right medial and lateral knee joint line, tender right patella tendon, tender right distal quadriceps   Tender over over bilateral gluteals, iliotibial bands      Gait:  Assistive device used: no assistive device     Gait assessment (description of gait pattern): walks with slow cadence, feet in ER, increased weightbearing left LE     Range of motion (in degrees)   Active  Passive  Normal    hip Left Right Left Right    Flexion 90 90 pain   125   Extension  15 15   30    Adduction  30 20 pain   30   Abduction 40 30 pain   30-50   Internal rotation 18 5 pain   40   External rotation 42 30 pain   60       Very tight bilateral hamstrings  Moderate tightness right gluteals       Knee            Active (Degrees)    Passive (Degrees) Normal range     Left Right Left  Right     flexion 125 110 pain NT NT 140   extension 0 0 pain NT NT 0-10         Strength:  MMT out of 5  hip Left Right   Flexion 4- 4-   Extension 4- 4-   Adduction  4- 4-   Abduction 4- 4-   Internal rotation 4- 4-   External rotation  4- 4-            Strength MMT  Out of 5 scale   knee Left Right   Flexion  4- 4-   Extension  4- 4-         Special Tests:    Left  Right  Reason for test Position of test   Yeoman's Test: Not tested Not tested Provocation test for SI joint dysfunction.    Prone: stabilize contralateral hip and pull upward on anterior thigh   Patrick's Test  (FABER) positive positive Provocation test for ipsilateral hip dysfunction, or contralateral SI joint dysfunction   Supine: Stabilize contralateral pelvis.  Flex, abduct and ER hip. (figure 4 position)     Femoral- acetabular impingement Test (FADIR) positive positive Provocation for hip impingement (groin pain)     Supine: Hip flexed and adducted across midline with Internal Rotation     Ober's Test negative negative Provocation test for TFL or IT band tightness     Sidelying: bottom hip/knee flexed, top hip at 0 with knee flexed.  Check available adduction   Straight Leg Raise negative negative Provocation test for sciatic nerve irritation (positive at 30-60 degrees)   Supine raise one leg up keeping it straight from 0-70 degrees.  (can be sensitized)            Treatment today in addition to evaluation(if applicable):    Manual  therapy 25 minutes for soft tissue mobilization to right gluteals, iliotibial band and right distal quadriceps.    Patient was educated in the anatomy and physiology involved.  Common symptoms, treatments, responses to treatment, and self management were discussed with the patient.       ASSESSMENT: The patient presents with bilateral hip pain and right knee that is limiting function. The signs and symptoms are consistent with pain from osteoarthritis bilateral  hips and right knee.    Benefit from physical therapy  for manual therapy for soft tissue mobilization, therapeutic exercises for stretches and strengthening exercises bilateral LE's to decrease bilateral hip and right knee pain and increase walking tolerance.    Clinical Presentation: Evolving with changing characteristics    The components of this evaluation necessitated a Low complexity level of clinical decision making.        Plan for next visit: teach strengthening exercises for bilateral LE's.      Patient Education:      Method of Teaching: demonstration, verbal and written hand-out     Learner: patient     Response: verbalizes understanding and able to give return demonstration       Has the plan of care been explained to the patient/caregiver? yes       Is the patient able to understand the plan of care: yes       Does the patient/caregiver(s) agree with the plan of care: yes       Are there barriers to learning? no.         Motivated to learn? yes       Best learning method: verbal, or demo    Amanda Williamson, PT  Evaluation time: 1300 to 1335  Treatment start time: 1335                   Treatment stop time: 1400         [1]   Past Surgical History:  Procedure Laterality Date    ------------OTHER------------- Right 02/2018    rotator cuff    COLONOSCOPY  07/08/2012    WNL    COLONOSCOPY  02/24/2015    ESOPHAGOGASTRODUODENOSCOPY (EGD)  02/24/2015    LIGATION, FALLOPIAN TUBE, BILATERAL, USING TUBAL RING  1994    PR ARTHRS KNEE DEBRIDEMENT/SHAVING ARTCLR CRTLG Right 12/24/2020    PR ARTHRS KNEE W/MENISCECTOMY MED&LAT W/SHAVING Right 12/24/2020

## 2024-02-17 ENCOUNTER — Ambulatory Visit: Admitting: Rehabilitative and Restorative Service Providers"

## 2024-02-17 NOTE — Allied Health Progress (Signed)
 Physical Therapy Treatment Note: 02/17/2024              Precautions: peripheral neuropathy bilateral LE's     .     Certification period end date (for Medicare patients): 05/06/2024  Estimated Discharge Date: 05/06/2024          Goals set on 02/06/2024  Patient will be able to walk 20-25 minutes with 3-4/10 right knee and bilateral hip pain at worst.  Patient will be able to ascend/descend 5 steps in and out of mobile home with 3-4/10 right knee and bilateral hip pain at worst.  Patient will be independent with home exercise program and home recommendations   Patient will be able to perform transfers sit to stand without use of hands with 3-4/10 right knee and bilateral hip pain at worst.        Total # of visits: 2/10     Treatment start time: 1002    Treatment stop time: 1030     Total Treatment Time: 28 minutes        Subjective: states she has been doing her home exercises  Pre treatment 6/10 right knee pain, 7/10 right hip pain  What matters most for today's session: decreasing right leg pain     Objective: Patient seen for the following treatment:   Manual therapy 28 minutes for soft tissue mobilization to right gluteals, iliotibial band and right distal quadriceps.       Assessment: Post treatment 4/10 right knee pain, 5/10 right hip pain    Plan: continue soft tissue mobilization to decrease pain.    Shelba Susi, PT

## 2024-02-24 ENCOUNTER — Ambulatory Visit: Admitting: Rehabilitative and Restorative Service Providers"

## 2024-03-02 ENCOUNTER — Ambulatory Visit: Admitting: Rehabilitative and Restorative Service Providers"

## 2024-03-02 NOTE — Allied Health Progress (Signed)
 Physical Therapy Treatment Note: 03/02/2024              Precautions: peripheral neuropathy bilateral LE's          Certification period end date (for Medicare patients): 05/06/2024  Estimated Discharge Date: 05/06/2024           Goals set on 02/06/2024  Patient will be able to walk 20-25 minutes with 3-4/10 right knee and bilateral hip pain at worst.  Patient will be able to ascend/descend 5 steps in and out of mobile home with 3-4/10 right knee and bilateral hip pain at worst.  Patient will be independent with home exercise program and home recommendations   Patient will be able to perform transfers sit to stand without use of hands with 3-4/10 right knee and bilateral hip pain at worst.        Total # of visits: 3/10     Treatment start time: 933    Treatment stop time: 1031     Total Treatment Time: 28 minutes        Subjective: Pre treatment 6/10 right knee pain, 6/10 right hip pain    What matters most for today's session: decreasing right leg pain      Objective: Patient seen for the following treatment:   Manual therapy 28 minutes for soft tissue mobilization to right gluteals, iliotibial band and right distal quadriceps.   Manual stretches to right gluteal sand quadriceps 3x30 seconds each.         Assessment: Post treatment 5/10 right knee pain, 5/10 right hip pain    Plan: continue soft tissue mobilization to decrease pain.       Arieonna Medine, PT

## 2024-03-09 ENCOUNTER — Ambulatory Visit: Attending: PHYSICIAN ASSISTANT | Admitting: Rehabilitative and Restorative Service Providers"

## 2024-03-09 NOTE — Allied Health Progress (Signed)
 Physical Therapy Treatment Note: 03/09/2024              Precautions: peripheral neuropathy bilateral LE's           Certification period end date (for Medicare patients): 05/06/2024  Estimated Discharge Date: 05/06/2024           Goals set on 02/06/2024  Patient will be able to walk 20-25 minutes with 3-4/10 right knee and bilateral hip pain at worst.  Patient will be able to ascend/descend 5 steps in and out of mobile home with 3-4/10 right knee and bilateral hip pain at worst.  Patient will be independent with home exercise program and home recommendations   Patient will be able to perform transfers sit to stand without use of hands with 3-4/10 right knee and bilateral hip pain at worst.        Total # of visits: 4/10     Treatment start time: 933    Treatment stop time: 1031     Total Treatment Time: 28 minutes        Subjective: has been doing home exercise program.  Carolin Chyle took it real easy and was not up on feet very much so had minimal pain today.    What matters most for today's session: decreasing right leg pain      Objective: Patient seen for the following treatment:   Walking with a normal gait pattern today.  Manual therapy 28 minutes for soft tissue mobilization to right gluteals, iliotibial band and right distal quadriceps.   Manual stretches to right gluteals and quadriceps 3x30 seconds each.     Assessment: Emphasized to patient to not do more than 20-25 minutes of activity up on her feet before sitting to rest so she doesn't aggravate her right hip and knee pain.    Plan:  progress strengthening exercises to right LE.    Khairi Garman, PT

## 2024-03-12 ENCOUNTER — Ambulatory Visit (HOSPITAL_BASED_OUTPATIENT_CLINIC_OR_DEPARTMENT_OTHER): Admitting: PHYSICIAN ASSISTANT

## 2024-03-12 VITALS — Temp 98.5°F

## 2024-03-12 NOTE — Progress Notes (Signed)
 CHIEF COMPLAINT:    Chief Complaint   Patient presents with    Knee Problem     Right knee pain      HISTORY OF PRESENT ILLNESS:   History of Present Illness  Amanda Williamson is a 58 year old female with osteoarthritis who presents for follow-up on her knee condition.    She has experienced significant improvement in her knee condition following regular physical therapy sessions and home exercises. Swelling and pain have reduced, which she attributes to the therapy. However, she still experiences some locking and catching in the knee, making her hesitant to use stairs. She wears a sleeve brace for stability, fearing her knee might buckle.    She is able to go on walks and engage in activities without pain and is not currently taking NSAIDs. She describes her knee pain as a 'three' and hip pain as a 'four' on a scale. Despite stiffness and cracking, she feels her condition is tolerable.    There is a family history of arthritis, as her mother has rheumatoid arthritis. She acknowledges that weather changes affect her symptoms, causing stiffness and achiness during colder months.    She uses a knee brace with bars for additional stability during outdoor activities and feels it provides confidence and prevents abnormal walking patterns. She also uses a cane for added security when needed.          PAST MEDICAL HISTORY: Past Medical History[1]    MEDICATIONS: Current Medications[2]    ALLERGIES: Patient has no known allergies.    PAST SURGICAL HISTORY: Past Surgical History[3]    SOCIAL HISTORY:   Social History     Socioeconomic History    Marital status: SINGLE     Spouse name: Not on file    Number of children: Not on file    Years of education: Not on file    Highest education level: Not on file   Occupational History    Not on file   Tobacco Use    Smoking status: Never    Smokeless tobacco: Never   Substance and Sexual Activity    Alcohol use: No    Drug use: No    Sexual activity: Not on file   Other Topics  Concern    Not on file   Social History Narrative    Daughter Wyman Heart. Retired [employer].     Social Drivers of Psychologist, prison and probation services Strain: Not on File (05/18/2020)    Received from [employer]     Financial Resource Strain: 0   Food Insecurity: Not on File (06/30/2023)    Received from [employer] Insecurity     Food: 0   Transportation Needs: Not on File (05/18/2020)    Received from [employer] Needs     Transportation: 0   Physical Activity: Not on File (05/18/2020)    Received from [facility]    Physical Activity     Physical Activity: 0   Stress: Not on File (05/18/2020)    Received from [facility]    Stress     Stress: 0   Social Connections: Not on File (06/16/2023)    Received from [employer]    Social Connections     Connectedness: 0   Intimate Partner Violence: Not on file   Housing Stability: Not on File (05/18/2020)    Received from [employer]  Housing: 0       FAMILY HISTORY: Family History[4]    ROS: A 14 point review of systems is reviewed and is negative with the exception of what is listed in the HPI.    Vitals:    03/12/24 1032   Temp: 36.9 C (98.5 F)   TempSrc: Temporal     There is no height or weight on file to calculate BMI.    PHYSICAL EXAMINATION  GENERAL: Well appearing, in no apparent distress.  PSYCHOLOGICAL: Alert and oriented x3, mood and affect appropriate for visit.  EYES: Extraocular movements are intact, no scleral icterus.  NEUROLOGICAL: CN II-XII grossly intact.  SKIN: Skin overlying the injured/operative extremity shows no signs of skin changes or rashes.  CARDIOVASCULAR: The patient has a 2+ radial pulse, symmetric bilaterally.  MUSCULOSKELETAL:    Right Knee:  No effusion  ROM: 0/2/120  Strength: 5/5 flexion, 5/5 extension  + Patellar Grind  (-) Varus/valgus  + McMurrays  (-) Lachmans  Nvi Distally      DIAGNOSTIC IMAGING:  none today     IMPRESSION:    Assessment & Plan  Osteoarthritis of right knee    Chronic osteoarthritis in the  right knee presents with intermittent pain, stiffness, and occasional locking and crepitus in the medial patella area. Swelling has significantly decreased with physical therapy. She manages well with physical therapy and knee braces, without needing NSAIDs or injections. Future interventions, such as steroid and hyaluronic acid injections, were discussed as temporary solutions for pain flare-ups. Knee replacement surgery will be considered when her quality of life is significantly impacted. She is currently satisfied with her condition and prefers to delay surgery until necessary. Continue physical therapy with [facility] and home exercises as instructed. Use a knee brace for stability, especially during walks and outdoor activities. Consider steroid or hyaluronic acid injections if pain flare-ups occur. Discuss knee replacement surgery when quality of life is significantly impacted.      PLAN:  Follow up as needed     Oral Billings PA-C  [facility]  [address] [place], [place]. [address] [place], [place] [address]  [phone]    I obtained verbal consent from the patient or the patient's representative and any other participants to use AI ambient technology to transcribe the interactions between the patient and myself during the clinical encounter. I read and reviewed the draft note.      This note was generated using Programmer, multimedia.  The best effort was made to avoid grammatical errors, but they still may exist.       [1]   Past Medical History:  Diagnosis Date    Diabetes (HCC)     Erythema nodosum     H/O mammogram 09/10/2016    Kidney stones     PFS (patellofemoral syndrome)     Knee pain    PONV (postoperative nausea and vomiting)     RAD (reactive airway disease)    [2]   Current Outpatient Medications:     Acetaminophen (TYLENOL) 500 mg Tablet, Take 650 mg by mouth every 6 hours if needed for pain or Fever., Disp: , Rfl:     Azelastine  Nasal (ASTELIN ) 137 mcg  (0.1 %) Spray, INSTILL ONE SPRAY INTO EACH NOSTRIL TWO TIMES DAILY., Disp: 90 mL, Rfl: 3    Gabapentin  (NEURONTIN ) 400 mg Capsule, Take 1 capsule by mouth 2 times daily., Disp: 360 capsule, Rfl: 0    Lisinopril  (PRINIVIL , ZESTRIL )  2.5 mg Tablet, Take 1 tablet by mouth every day., Disp: 90 tablet, Rfl: 3    Metformin  (GLUCOPHAGE ) 1,000 mg tablet, Take 1 tablet by mouth 2 times daily with meals., Disp: 180 tablet, Rfl: 3    Metronidazole  (METROGEL ) 1 % gel, Apply to the affected area every day. Use a thin layer to affected areas after washing, Disp: 45 g, Rfl: 0    MULTIVITAMIN PO, Take 1 tablet by mouth every morning. Twice a day, Disp: , Rfl:     Psyllium Husk 0.52 gram Capsule, Take 1 capsule by mouth every morning., Disp: , Rfl:     semaglutide  (OZEMPIC ) 0.25 mg or 0.5 mg (2 mg/3 mL) Pen Injector, Inject 0.25 mg subcutaneously one time each week for 28 days, THEN 0.5 mg one time each week. Start 0.25 mg weekly for 4 weeks, increasing to 0.5 mg weekly thereafter., Disp: 9 mL, Rfl: 0    Sertraline  (ZOLOFT ) 100 mg Tablet, TAKE 1 TABLET BY MOUTH EVERY DAY IN THE MORNING, Disp: 90 tablet, Rfl: 3    Simvastatin  (ZOCOR ) 10 mg Tablet, Take 1 tablet by mouth every day., Disp: 90 tablet, Rfl: 3    TURMERIC PO, Take 1 capsule by mouth every day at bedtime., Disp: , Rfl:   [3]   Past Surgical History:  Procedure Laterality Date    ------------OTHER------------- Right 02/2018    rotator cuff    COLONOSCOPY  07/08/2012    WNL    COLONOSCOPY  02/24/2015    ESOPHAGOGASTRODUODENOSCOPY (EGD)  02/24/2015    LIGATION, FALLOPIAN TUBE, BILATERAL, USING TUBAL RING  1994    PR ARTHRS KNEE DEBRIDEMENT/SHAVING ARTCLR CRTLG Right 12/24/2020    PR ARTHRS KNEE W/MENISCECTOMY MED&LAT W/SHAVING Right 12/24/2020   [4]   Family History  Problem Relation Name Age of Onset    Arthritis Mother          Rheumitoid    Lipids Mother      Diabetes Father      Other (Other) Father          Kidney faliure    No Known Problems Sister      No Known Problems  Brother      No Known Problems Brother      No Known Problems Brother      No Known Problems Daughter      No Known Problems Daughter      No Known Problems Daughter      Other (Other) Son          pulmonary    Diabetes Son          childhood

## 2024-03-16 ENCOUNTER — Ambulatory Visit: Admitting: Rehabilitative and Restorative Service Providers"

## 2024-03-16 NOTE — Allied Health Progress (Signed)
 Physical Therapy Treatment Note: 03/16/2024              Precautions: peripheral neuropathy bilateral LE's           Certification period end date (for Medicare patients): 05/06/2024  Estimated Discharge Date: 05/06/2024           Goals set on 02/06/2024  Patient will be able to walk 20-25 minutes with 3-4/10 right knee and bilateral hip pain at worst.  Patient will be able to ascend/descend 5 steps in and out of mobile home with 3-4/10 right knee and bilateral hip pain at worst.  Patient will be independent with home exercise program and home recommendations   Patient will be able to perform transfers sit to stand without use of hands with 3-4/10 right knee and bilateral hip pain at worst.        Total # of visits: 5/10     Treatment start time: 932    Treatment stop time: 1000     Total Treatment Time: 28 minutes        Subjective: states her right hip and knee pain has been mild over the past 1 1/2 weeks.     What matters most for today's session: decreasing right leg pain      Objective: Patient seen for the following treatment:   Instructed on and completed exercises 18 minutes as follows, bridging x10 reps 5 second hold, bridging with hip abduction using L4TB x10, bridging on physio ball x10 reps 5 second hold. Right hip adduction in right sidelying x10 reps 5 second hold. Standing heel raises x10 reps, 1/4 squats x10 reps.    Stretches to right gluteals and quadriceps 3x30 seconds each.                                                                                                                                                                                                                                                                                  Manual therapy 10 minutes for soft tissue mobilization to right gluteals, iliotibial band and right distal quadriceps.        Assessment: patient has been pacing activities and overall her right  hip and knee pain has decreased in intensity.    Plan: continue  strengthening exercises to LE's.    Amanda Williamson, PT

## 2024-03-23 ENCOUNTER — Ambulatory Visit: Admitting: Rehabilitative and Restorative Service Providers"

## 2024-03-23 NOTE — Allied Health Progress (Signed)
 Physical Therapy Treatment Note: 03/23/2024              Precautions: peripheral neuropathy bilateral LE's           Certification period end date (for Medicare patients): 05/06/2024  Estimated Discharge Date: 05/06/2024           Goals set on 02/06/2024  Patient will be able to walk 20-25 minutes with 3-4/10 right knee and bilateral hip pain at worst.  Patient will be able to ascend/descend 5 steps in and out of mobile home with 3-4/10 right knee and bilateral hip pain at worst.  Patient will be independent with home exercise program and home recommendations   Patient will be able to perform transfers sit to stand without use of hands with 3-4/10 right knee and bilateral hip pain at worst.        Total # of visits: 6/10     Treatment start time: 901    Treatment stop time: 929     Total Treatment Time: 28 minutes        Subjective: c/o 8/10 pain over entire body after getting EMG test to UE's/LE's yesterday.     What matters most for today's session: decreasing right leg pain      Objective: Patient seen for the following treatment:   Manual therapy 28 minutes for soft tissue mobilization to right gluteals, iliotibial band and right distal quadriceps.                                                                                                                                                                                                                                                                                     Assessment: no exercises done today due pain exacerbation from EMG procedure yesterday.    Plan: to just do stretches until generalized body pain settles down    Norleen Henry, PT

## 2024-03-30 ENCOUNTER — Ambulatory Visit: Admitting: Rehabilitative and Restorative Service Providers"

## 2024-03-30 NOTE — Allied Health Progress (Signed)
 Physical Therapy Treatment Note: 03/30/2024              Precautions: peripheral neuropathy bilateral LE's           Certification period end date (for Medicare patients): 05/06/2024  Estimated Discharge Date: 05/06/2024           Goals set on 02/06/2024  Patient will be able to walk 20-25 minutes with 3-4/10 right knee and bilateral hip pain at worst.  Patient will be able to ascend/descend 5 steps in and out of mobile home with 3-4/10 right knee and bilateral hip pain at worst.  Patient will be independent with home exercise program and home recommendations   Patient will be able to perform transfers sit to stand without use of hands with 3-4/10 right knee and bilateral hip pain at worst.        Total # of visits: 7/10     Treatment start time: 940    Treatment stop time: 1008     Total Treatment Time: 28 minutes        Subjective: right knee and hip pain 3-4/10 today, still generalized achiness all over body and extremities from EMG testing 03/22/2024    What matters most for today's session: decreasing right leg pain      Objective: Patient seen for the following treatment:   Instructed on and completed exercises 18 minutes as follows, bridging x10 reps 5 second hold, bridging with hip abduction using L4TB x10, bridging on physio ball x10 reps 5 second hold. Right hip adduction in right sidelying x10 reps 5 second hold. Standing heel raises x10 reps, 1/4 squats x10 reps.    Stretches to right gluteals and quadriceps 3x30 seconds each.                                                                                                                                                                                                                                                                                  Manual therapy 10 minutes for soft tissue mobilization to right gluteals, iliotibial band and right distal quadriceps.           Assessment:  increased right leg  pain from EMG test is settling down.    Plan: continue  strengthening exercises to LE's.    Evalyse Stroope, PT

## 2024-04-13 ENCOUNTER — Ambulatory Visit: Attending: PHYSICIAN ASSISTANT | Admitting: Rehabilitative and Restorative Service Providers"

## 2024-04-13 NOTE — Allied Health Progress (Signed)
 Physical Therapy Treatment Note and Discharge Report: 04/13/2024   Diagnosis:  Therapy diagnosis:   M25.551 (ICD-10-CM) - Right hip pain   M17.11 (ICD-10-CM) - Primary osteoarthritis of right knee      Medical diagnosis:   M25.551 (ICD-10-CM) - Right hip pain   M17.11 (ICD-10-CM) - Primary osteoarthritis of right knee         Authorizing MD (First and last name) and PI#: Mitzie Joy PA  Onset Date:  12/02/2023    Start of Care Date: 02/06/2024           Precautions: peripheral neuropathy bilateral LE's           Certification period end date (for Medicare patients): 05/06/2024  Estimated Discharge Date: 05/06/2024           Goals set on 02/06/2024  Patient will be able to walk 20-25 minutes with 3-4/10 right knee and bilateral hip pain at worst.  Patient will be able to ascend/descend 5 steps in and out of mobile home with 3-4/10 right knee and bilateral hip pain at worst.  Patient will be independent with home exercise program and home recommendations   Patient will be able to perform transfers sit to stand without use of hands with 3-4/10 right knee and bilateral hip pain at worst.        Total # of visits: 8     Treatment start time: 935    Treatment stop time: 1003     Total Treatment Time: 28 minutes        Subjective: no right knee and hip pain today    What matters most for today's session: decreasing right leg pain      Objective: Patient seen for the following treatment:   Completed exercises 28 minutes as follows, bridging x10 reps 5 second hold, bridging with hip abduction using L4TB x10, bridging on physio ball x10 reps 5 second hold. Right hip adduction in right sidelying x10 reps 5 second hold. Standing heel raises x10 reps, 1/4 squats x10 reps. Standing hip extension and abduction x10 reps each using L4TB.  Stretches to right gluteals, hamstrings and quadriceps 3x30 seconds each each.  Assessment: All treatment goals met. Can complete all ADL's as long as she paces her activities without much increase in her right leg pain.  Goals set on 02/06/2024  Patient will be able to walk 20-25 minutes with 3-4/10 right knee and bilateral hip pain at worst. PARTIALLY MET, does 15 minutes without pain, then starts getting pain developing after walking long er than this amount of time so d=she sits and rests.  Patient will be able to ascend/descend 5 steps in and out of mobile home with 3-4/10 right knee and bilateral hip pain at worst. MET, no pain   Patient will be independent with home exercise program and home recommendations. MET   Patient will be able to perform transfers sit to stand without use of hands with 3-4/10 right knee and bilateral hip pain at worst. MET, no pain    Plan: Discharge from physical therapy on a home exercise program    Norleen Henry, PT

## 2024-04-16 ENCOUNTER — Other Ambulatory Visit (HOSPITAL_BASED_OUTPATIENT_CLINIC_OR_DEPARTMENT_OTHER): Payer: Self-pay | Admitting: Adult Health

## 2024-04-16 DIAGNOSIS — E66811 Obesity, class 1: Secondary | ICD-10-CM

## 2024-04-16 DIAGNOSIS — E1169 Type 2 diabetes mellitus with other specified complication: Secondary | ICD-10-CM

## 2024-04-16 NOTE — Telephone Encounter (Signed)
 Last ordered: 2 months ago (01/18/2024)      Lab Results   Component Value Date    HGBA1C 7.4 (H) 01/13/2024    HGBA1C 7.6 (H) 07/18/2023    HGBA1C 6.7 (H) 03/07/2023     Recent Visits  Date Type Provider Dept   01/18/24 Office Visit [name]   12/19/23 Telephone [name]   11/23/23 Telephone [name]   07/18/23 Office Visit [name]   Showing recent visits within past 365 days and meeting all other requirements  Future Appointments  Date Type Provider Dept   04/25/24 Appointment [name]   Showing future appointments within next 180 days and meeting all other requirements     DM Medications in Epic (for confirmation)  Diabetes Medication              Metformin  (GLUCOPHAGE ) 1,000 mg tablet Take 1 tablet by mouth 2 times daily with meals.

## 2024-04-17 NOTE — Telephone Encounter (Signed)
 Called pt, she has completed 3 weeks on the ozempic  0.5mg . She has an appt on 04/25/24, therefore will discuss possible increase at that appt. Therefore cancelled this refill request.

## 2024-04-19 ENCOUNTER — Encounter (HOSPITAL_BASED_OUTPATIENT_CLINIC_OR_DEPARTMENT_OTHER): Payer: Self-pay

## 2024-04-20 ENCOUNTER — Ambulatory Visit: Attending: Adult Health

## 2024-04-20 LAB — CBC WITH DIFFERENTIAL
Basophils % Auto: 0.6 % (ref 0.0–1.0)
Basophils Abs Auto: 0 K/MM3 (ref 0.0–0.1)
Eosinophils % Auto: 4.9 % — ABNORMAL HIGH (ref 0.0–4.0)
Eosinophils Abs Auto: 0.3 K/MM3 — ABNORMAL HIGH (ref 0.0–0.2)
Hematocrit: 35.9 % — ABNORMAL LOW (ref 36.0–48.0)
Hemoglobin: 12.7 g/dL (ref 12.0–16.0)
Immature Granulocytes % Auto: 0.2 % (ref 0.00–0.50)
Immature Granulocytes Abs Auto: 0 K/MM3 (ref 0.0–0.0)
Lymphocytes % Auto: 34.8 % (ref 5.0–41.0)
Lymphocytes Abs Auto: 1.8 K/MM3 (ref 1.3–2.9)
MCH: 30.2 pg (ref 27.0–34.0)
MCHC g/dL: 35.4 g/dL (ref 33.0–37.0)
MCV: 85.5 fL (ref 82.0–97.0)
MPV: 10.6 fL (ref 9.4–12.4)
Monocytes % Auto: 7.6 % (ref 0.0–10.0)
Monocytes Abs Auto: 0.4 K/MM3 (ref 0.3–0.8)
Neutrophils % Auto: 51.9 % (ref 45.0–75.0)
Neutrophils Abs Auto: 2.68 K/MM3 (ref 2.20–4.80)
Nucleated Cell Count: 0 K/MM3 (ref 0.0–0.1)
Nucleated RBC/100 WBC: 0 %{WBCs} (ref <=0.0)
Platelet Count: 220 K/MM3 (ref 151–365)
RDW: 12.9 % (ref 11.5–14.5)
Red Blood Cell Count: 4.2 M/MM3 (ref 3.80–5.10)
White Blood Cell Count: 5.2 K/MM3 (ref 4.2–10.8)

## 2024-04-20 LAB — COMPREHENSIVE METABOLIC PANEL
Adjusted Calcium: 9.1 mg/dL (ref 8.7–10.2)
Alanine Transferase (ALT): 13 U/L (ref 7–52)
Alb/Glob Ratio: 1.4 (ref 1.0–1.6)
Albumin: 4 g/dL (ref 3.5–5.7)
Alkaline Phosphatase (ALP): 62 U/L (ref 34–104)
Aspartate Transaminase (AST): 15 U/L (ref 13–39)
BUN/ Creatinine: 23 — ABNORMAL HIGH (ref 7.3–21.7)
Bilirubin Total: 0.4 mg/dL (ref 0.3–1.0)
Calcium: 9.1 mg/dL (ref 8.6–10.3)
Carbon Dioxide Total: 27 mmol/L (ref 21–31)
Chloride: 107 mmol/L (ref 98–107)
Creatinine Serum: 0.74 mg/dL (ref 0.60–1.20)
Globulin: 2.9 g/dL (ref 2.2–4.2)
Glucose: 112 mg/dL — ABNORMAL HIGH (ref 74–109)
Potassium: 4.2 mmol/L (ref 3.5–5.0)
Protein: 6.9 g/dL (ref 6.4–8.9)
Sodium: 139 mmol/L (ref 136–145)
Urea Nitrogen, Blood (BUN): 17 mg/dL (ref 6–20)
eGFR Creatinine (Female): 94 mL/min/1.73m*2 (ref >60)

## 2024-04-20 LAB — LIPID PANEL
Cholesterol: 150 mg/dL (ref <200)
HDL Cholesterol: 32.7 mg/dL — ABNORMAL LOW (ref >=40.0)
LDL Cholesterol Calculation: 87 mg/dL (ref <100)
Non-HDL Cholesterol: 117.3 mg/dL (ref <=150.0)
Total Cholesterol: HDL Ratio: 4.6 mg/dL (ref 2.0–5.0)
Triglyceride Level: 152 mg/dL — ABNORMAL HIGH (ref <150)

## 2024-04-20 LAB — HEMOGLOBIN A1C
Hgb A1C,Glucose Est Avg: 137 mg/dL
Hgb A1C: 6.4 % — ABNORMAL HIGH (ref 4.0–5.6)

## 2024-04-20 LAB — RHEUMATOID FACTOR: Rheumatoid Factor: <10 [IU]/mL (ref <=14)

## 2024-04-20 LAB — SED RATE WESTERGREN: Sed Rate Westergren: 17 mm/h (ref 0–20)

## 2024-04-20 LAB — C-REACTIVE PROTEIN: C-Reactive Protein: 3 mg/L (ref <=10)

## 2024-04-23 ENCOUNTER — Ambulatory Visit (HOSPITAL_BASED_OUTPATIENT_CLINIC_OR_DEPARTMENT_OTHER): Payer: Self-pay | Admitting: Family Medicine

## 2024-04-23 LAB — ANTI-NUCLEAR AB SCREEN (ANA): Antinuclear Ab: NEGATIVE

## 2024-04-25 ENCOUNTER — Encounter (HOSPITAL_BASED_OUTPATIENT_CLINIC_OR_DEPARTMENT_OTHER): Payer: Self-pay | Admitting: Adult Health

## 2024-04-25 ENCOUNTER — Ambulatory Visit (HOSPITAL_BASED_OUTPATIENT_CLINIC_OR_DEPARTMENT_OTHER): Admitting: Adult Health

## 2024-04-25 VITALS — BP 124/80 | HR 88 | Temp 97.0°F | Ht 64.0 in | Wt 174.2 lb

## 2024-04-25 MED ORDER — SEMAGLUTIDE 1 MG/DOSE (4 MG/3 ML) SUBCUTANEOUS PEN INJECTOR: 1.0000 mg | PEN_INJECTOR | SUBCUTANEOUS | 3 refills | Status: AC

## 2024-04-25 NOTE — Progress Notes (Signed)
 MEDICARE ANNUAL WELLNESS Initial VISIT     Date: 04/25/2024   Patient Name: Amanda Williamson     CHIEF COMPLAINT / REASON FOR VISIT:      Amanda Williamson is a 71yr year old presenting for an Initial Annual Medicare Wellness Visit.     HEALTH HISTORY:       She is seen in 64-month diabetes follow-up.  Her diabetes was not to goal at the 01/18/2024 visit and she was agreeable to a trial of semaglutide  0.25 mg weekly, with the plan to increase to 0.5 mg after 4 weeks.  She has been doing well on the 0.5 mg weekly.  04/20/2024 A1c is 6.4, down from 7.4 01/13/24 labs.  CMP within normal limits except random glucose 112.  CBC within normal limits.  Her weight is down 5 pounds since initiating therapy. She's ready to increase her dose today.     Hyperlipidemia-currently managed on simvastatin  10 mg daily.  04/20/2024 LDL was 87, HDL 32, triglycerides 152.    She reported a rash on her cheeks in December.  04/21/2023 ANA was negative, rheumatoid factor negative, ESR normal at 17.  She has not had any recurrence.    She saw Amanda Williamson neurology 03/21/2024 for EMG/nerve conduction studies given her chronic lower extremity polyneuropathy.  For some reason only the left upper and lower extremities were done, results showed:    ELECTROMYOGRAPHY:  Normal study of LEFT lower extremities. Muscles tested included EHL, tibialis anterior, PL, medial gastrocnemius,VL    IMPRESSION:  Normal study of LEFT upper and lower extremities. There are no findings to suggest focal entrapment neuropathy or peripheral neuropathy. Please note this study does not evaluate for small fiber neuropathy.    There is no electrophysiological evidence of radiculopathy or plexopathy in the LEFT lower extremities. No active denervation changes were seen in any of the muscles tested.     She is currently managed on gabapentin  twice daily which is helping.  3 times a day dosing caused adverse effects. She was prescribed Metanx but not covered and never took it.  She is  currently followed by podiatry for neuropathy and diabetic foot screening, last seen 07/06/2023 with 63-month follow-up recommended; not yet scheduled.    Anxiety-she is doing amazing since establishing with counselor Amanda Williamson.  It is helping so much you cannot imagine.  She has been able to get off her sertraline  and she is very happy with current weekly counseling.    Screening:  1) Cervical cancer- 04/19/22, negative cytology and negative HR-HPV  2) Breast cancer-03/18/2023, within normal limits.  She prefers every other year screening at this time.  3) Colon cancer-colonoscopy 02/24/2015 per EMR, results not available. Per patient 10 year recall.  4) Lung cancer- n/a  5) Osteoporosis- never screened. Ordered today.  6) Diabetes/lipids-   Lab Results   Component Value Date    HGBA1C 6.4 (H) 04/20/2024    A1CEAG 137 04/20/2024      Lab Results   Component Value Date    CHOL 150 04/20/2024    HDL 32.7 (L) 04/20/2024    LDLC 87 04/20/2024    TRIG 152 (H) 04/20/2024    NONHDLCHOL 117.3 04/20/2024    Ordered today for 10/2024.  7) Hep C-  Lab Results   Component Value Date    HEPCABSCR Nonreactive 12/08/2017     8) STI- pt declines today citing lack of RF.  Vaccines:  1) Influenza- 07/18/23.  Recommended for fall 2025.  2)  PNA- Prevnar 13 not on file; Prevnar 20  04/19/2022; PPSV23 not on file  3) Shingles-06/02/2022, 11/02/2022  4) Tetanus-Tdap 01/08/2021  5) Hep B- not immunized.  Referred to local pharmacy today for vaccination.  6) Covid-19-recommended for booster fall 2025    PHYSICAL ASSESSMENT:      Temp: 36.1 C (97 F) (07/23 1137)  Temp src: Temporal (07/23 1137)  Pulse: 88 (07/23 1137)  BP: 124/80 (07/23 1137)  Resp: --  SpO2: 98 % (07/23 1137)  Height: 162.6 cm (5' 4) (07/23 1137)  Weight: 79 kg (174 lb 3.2 oz) (07/23 1137)      The patient is negative for cognitive impairment.      Other factors as deemed appropriate based on medical, family and social history and current clinical standards: No          RISK ASSESSMENT AND PLAN FOR PREVENTATIVE CARE / EDUCATION:      Advanced Care planning: There are no ACP documents/ surrogate in the EHR. The patient was given information about how to create and file ACP documents.      A written updated summary of Medicare Annual Wellness Visit was given to this patient. It details what healthcare maintenance has been done and when they are due for their next subsequent Medicare Annual Wellness Visit      Preventative health issues were discussed, with counseling provided     Opioid Risk Screening: N/A     Substance Use Screening: N/A     Hearing/Fall Prevention: Discussed with patient. See orders as appropriate.     Stop Smoking Counseling: n/a     Reduce Alcohol Intake Counseling:no     Exercise and Nutrition Counseling: Discussed with patient and handouts provided.     Update a list of patient risk factors and conditions where primary, secondary, or tertiary interventions are recommended or underway:  1. Well adult exam (Primary)    2. Screening for depression  3. Negative depression screening  4. Screening for substance abuse  Negative screens today.    5. Screening for osteoporosis  6. Postmenopausal  Baseline screen is ordered.  - [facility] DEXA; Future    7. Type 2 diabetes mellitus with obesity (HCC)  She had a full point reduction of her A1c since starting semaglutide  3 months ago.  Her weight is down 5 pounds.  She would like to increase to the 1 mg dose today which is done.  Check labs for 20-month follow-up  - CBC with Differential; Future  - Comprehensive Metabolic Panel; Future  - Creatinine Spot Urine; Future  - Microalbumin; Future  - Hemoglobin A1C; Future  - semaglutide  1 mg/dose (4 mg/3 mL) Pen Injector; Inject 1 mg subcutaneously one time each week. Check flow before first injection with each new pen.  Dispense: 9 mL; Refill: 3    8. Diabetic polyneuropathy associated with type 2 diabetes mellitus (HCC)  Doing fair with gabapentin .  Recommend she reestablish with  podiatry for diabetic foot following.    9. Dyslipidemia  Stable with current regimen, no changes made today.  - Lipid Panel; Future         Further discussion No      Return in about 6 months (around 10/26/2024) for diabetes.     Lorita Rank, we appreciate the trust you have placed in us  to care for you. If there are any other concerns you have, please let us  know.      Jama MARLA Maxwell, NP

## 2024-04-25 NOTE — Patient Instructions (Addendum)
 I've ordered you nonfasting labs to have done January 2026. The order is already at the [facility] lab, you no longer have to take a lab slip with you.     Please schedule your podiatry follow up at your convenience.     I have ordered you a bone density test to have done at your convenience.   IMMUNIZATIONS:  We recommend an annual flu vaccine, typically given between August and November each year.    GYN CANCERS:  Please see the handout: What Women Need to Know about Women's Cancers from the Office of Women's Health, [place]  Department of Public Health.   PAPs, if normal, are advised every 5 years from age 11 to 90.    SKIN CANCER PREVENTION:  If you have fair skin, you should minimize your exposure to ultraviolet radiation to reduce the risk for skin cancer.    CALCIUM AND VITAMIN D :  Calcium supplementation is only necessary if you do not get enough from your diet. Dietary sources are recommended first because they are absorbed better. Milk and dairy products such as yogurt are the best sources of calcium. They contain a form of calcium that your body can easily absorb. If you cannot tolerate dairy products, green leafy vegetables such as broccoli, collards, kale, mustard greens, turnip greens, and bok choy or [place] cabbage are good sources of calcium. Other sources of calcium that can help meet your body's calcium needs are salmon and sardines canned with their soft bones, blackstrap molasses, almonds, [place] nuts, sunflower seeds, tahini, and dried beans.  Getting enough Vitamin D  is very important. Vitamin D  helps with calcium absorption, supports overall bone health, improves muscle performance and balance, and results in a reduced risk for falls. If you don't get enough vitamin D , all the calcium intake and calcium supplements will not help. Vitamin D  can be taken in over the counter supplements. Most adults should take between 1000 IU and 4000 IU daily. Because it is a fat-soluble vitamin, you can  take your full dose once a week. There is a lab test for Vitamin D . I recommend a Vitamin D  level of between 35 and 80.     EXERCISE:  Moderate to vigorous aerobic activity is recommended three to five times per week. You should get some exercise daily.  Do weight training or resistance exercises to maintain strength. Studies show that if you are stronger physically, you will feel stronger emotionally as well.  Do flexibility activities to maintain range of motion.   Do balance training to improve stability and prevent falls.    ALCOHOL:  Limit alcohol to less than 2 per day and less than 7 per week.  Avoid alcohol completely if you have a family history of alcoholism.    END-OF-LIFE WISHES:  If you have a Living Will / Advanced Directive / Power of Attorney, you can bring it in and we can scan it into your electronic health record.    We appreciate the trust you have placed in us  to care for you. If there are any other concerns you have, please let us  know.

## 2024-04-26 NOTE — Progress Notes (Signed)
 reviewed

## 2024-04-27 ENCOUNTER — Ambulatory Visit
Admission: RE | Admit: 2024-04-27 | Discharge: 2024-04-27 | Disposition: A | Discharge status: Home / Self Care | Source: Ambulatory Visit | Attending: Adult Health

## 2024-05-01 ENCOUNTER — Ambulatory Visit (HOSPITAL_BASED_OUTPATIENT_CLINIC_OR_DEPARTMENT_OTHER): Payer: Self-pay | Admitting: Family Medicine

## 2024-05-01 NOTE — Progress Notes (Signed)
 Bone density scan is normal.  Repeat in 5 years

## 2024-05-08 ENCOUNTER — Telehealth (HOSPITAL_BASED_OUTPATIENT_CLINIC_OR_DEPARTMENT_OTHER): Payer: Self-pay | Admitting: Adult Health

## 2024-05-08 DIAGNOSIS — R1011 Right upper quadrant pain: Secondary | ICD-10-CM

## 2024-05-08 NOTE — Telephone Encounter (Addendum)
 Patient called because she is experiencing stomach pain below her chest that has been constant since Sunday with nausea as well. She noted that she is on Ozempic  and that at her last visit with Jama she was told to call in if she experienced any pain. She is wondering if this could be a side effect of the medication. Can be reached at her mobile number [phone]

## 2024-05-08 NOTE — Telephone Encounter (Signed)
 Covering for Lee-please ensure she is not having fevers or chills.  Diarrhea?  Vomiting?  Please clarify her dose of semaglutide  is at 0.5 mg or 0.25 mg?

## 2024-05-08 NOTE — Telephone Encounter (Signed)
 Patient is not having any fever, chills, diarrhea or vomiting and she is using semaglutide  0.5mg .

## 2024-05-08 NOTE — Telephone Encounter (Signed)
 Called patient and she let me know the stomach pain is in the upper part of her stomach, is an on and off pain, when the pain comes it last for 3 to 4 hours, her stomach gets hard and bloated. The patient has also nausea and isn't eating much as she does not feel like eating. Patient still using the  semaglutide  (OZEMPIC ) 0.25 mg or 0.5 mg (2 mg/3 mL) as recommenced per Jama Maxwell NP on her last office visit as she still have it at home. Patient was supposed to take her Ozempic  today but she will hold until she have some feed back from Jama Maxwell NP as the medication can give her even more nausea. Please advise.

## 2024-05-09 ENCOUNTER — Ambulatory Visit (HOSPITAL_BASED_OUTPATIENT_CLINIC_OR_DEPARTMENT_OTHER): Payer: Self-pay | Admitting: Adult Health

## 2024-05-09 ENCOUNTER — Ambulatory Visit (HOSPITAL_BASED_OUTPATIENT_CLINIC_OR_DEPARTMENT_OTHER): Admitting: Adult Health

## 2024-05-09 ENCOUNTER — Encounter (HOSPITAL_BASED_OUTPATIENT_CLINIC_OR_DEPARTMENT_OTHER): Payer: Self-pay | Admitting: Adult Health

## 2024-05-09 ENCOUNTER — Ambulatory Visit: Attending: Adult Health

## 2024-05-09 VITALS — BP 126/86 | HR 107 | Temp 96.0°F | Resp 16 | Ht 64.0 in | Wt 170.0 lb

## 2024-05-09 LAB — CBC WITH DIFFERENTIAL
Basophils % Auto: 0.6 % (ref 0.0–1.0)
Basophils Abs Auto: 0 K/MM3 (ref 0.0–0.1)
Eosinophils % Auto: 3.5 % (ref 0.0–4.0)
Eosinophils Abs Auto: 0.2 K/MM3 (ref 0.0–0.2)
Hematocrit: 37.9 % (ref 36.0–48.0)
Hemoglobin: 13.7 g/dL (ref 12.0–16.0)
Immature Granulocytes % Auto: 0.2 % (ref 0.00–0.50)
Immature Granulocytes Abs Auto: 0 K/MM3 (ref 0.0–0.0)
Lymphocytes % Auto: 22.2 % (ref 5.0–41.0)
Lymphocytes Abs Auto: 1.1 K/MM3 — ABNORMAL LOW (ref 1.3–2.9)
MCH: 31.1 pg (ref 27.0–34.0)
MCHC g/dL: 36.1 g/dL (ref 33.0–37.0)
MCV: 86.1 fL (ref 82.0–97.0)
MPV: 10.1 fL (ref 9.4–12.4)
Monocytes % Auto: 8.8 % (ref 0.0–10.0)
Monocytes Abs Auto: 0.4 K/MM3 (ref 0.3–0.8)
Neutrophils % Auto: 64.7 % (ref 45.0–75.0)
Neutrophils Abs Auto: 3.15 K/MM3 (ref 2.20–4.80)
Nucleated Cell Count: 0 K/MM3 (ref 0.0–0.1)
Nucleated RBC/100 WBC: 0 %{WBCs} (ref <=0.0)
Platelet Count: 232 K/MM3 (ref 151–365)
RDW: 12.8 % (ref 11.5–14.5)
Red Blood Cell Count: 4.4 M/MM3 (ref 3.80–5.10)
White Blood Cell Count: 4.9 K/MM3 (ref 4.2–10.8)

## 2024-05-09 LAB — COMPREHENSIVE METABOLIC PANEL
Adjusted Calcium: 9.3 mg/dL (ref 8.7–10.2)
Alanine Transferase (ALT): 16 U/L (ref 7–52)
Alb/Glob Ratio: 1.3 (ref 1.0–1.6)
Albumin: 4.4 g/dL (ref 3.5–5.7)
Alkaline Phosphatase (ALP): 74 U/L (ref 34–104)
Aspartate Transaminase (AST): 19 U/L (ref 13–39)
BUN/ Creatinine: 11.4 (ref 7.3–21.7)
Bilirubin Total: 0.5 mg/dL (ref 0.3–1.0)
Calcium: 9.6 mg/dL (ref 8.6–10.3)
Carbon Dioxide Total: 31 mmol/L (ref 21–31)
Chloride: 101 mmol/L (ref 98–107)
Creatinine Serum: 0.7 mg/dL (ref 0.60–1.20)
Globulin: 3.4 g/dL (ref 2.2–4.2)
Glucose: 101 mg/dL (ref 74–109)
Potassium: 4.2 mmol/L (ref 3.5–5.0)
Protein: 7.8 g/dL (ref 6.4–8.9)
Sodium: 138 mmol/L (ref 136–145)
Urea Nitrogen, Blood (BUN): 8 mg/dL (ref 6–20)
eGFR Creatinine (Female): 100 mL/min/1.73m*2 (ref >60)

## 2024-05-09 LAB — LIPASE: Lipase: 27 U/L (ref 11–82)

## 2024-05-09 LAB — AMYLASE: Amylase: 38 U/L (ref 22–80)

## 2024-05-09 NOTE — Telephone Encounter (Addendum)
 The concern is for increased Titus with semaglutide .  Please have her hold the dose, obtain the labs this morning if possible.  Please offer follow-up today in the office if possible.  Ideally she would obtain the labs this morning so we would have them for review this afternoon.

## 2024-05-09 NOTE — Addendum Note (Signed)
 Addended by: Khloei Spiker on: 05/09/2024 08:33 AM     Modules accepted: Orders

## 2024-05-09 NOTE — Telephone Encounter (Signed)
 Patient was informed and I schedule her with Jama Maxwell NP today 05/09/2024 and patient will do the labs before her appointment.

## 2024-05-09 NOTE — Progress Notes (Signed)
 HPI:  Amanda Williamson is a 58yr female here today for concerns of Abdominal Pain (Since 05/06/2024 with nausea) and Test Results (Labs results)  .    She is seen today for complaints of epigastric abdominal pain.  It started 8/3 with epigastric abdominal pain, distention.  Her stomach felt hard and bloated.  She also had nausea and decreased appetite.  This continued the entire day and the next.  By the afternoon of 8/5 she did start to get better and today she is much better.  The pain has subsided and she is able to eat again with minimal pain.    She is manage on semaglutide  and her dose was increased after her 04/25/2024 visit, however she has not increased the dose yet. I ordered labs this morning including CBC, CMP, lipase, amylase which were within normal limits.    She is not having any fever/chills.  She has had 2 bowel movements which were solid during this illness, no diarrhea.    Past Medical History[1]  Past Surgical History[2]  Current Medications[3]  Allergies: Allergies[4]    REVIEW OF SYSTEMS:  As per HPI.    VITAL SIGNS:  Temp: 35.6 C (96 F) (08/06 1511)  Temp src: Temporal (08/06 1511)  Pulse: 107 (08/06 1511)  BP: 126/86 (08/06 1511)  Resp: 16 (08/06 1511)  SpO2: 98 % (08/06 1511)  Height: 162.6 cm (5' 4) (08/06 1511)  Weight: 77.1 kg (170 lb) (08/06 1511)    PHYSICAL EXAM:  Physical Exam  Constitutional:       Comments: Well-appearing, nontoxic pleasant 58 year old female in no acute distress.   Cardiovascular:      Comments: Rate and rhythm regular without MGR.  Pulmonary:      Comments: Clear to auscultation bilaterally without rales, rhonchi, wheezing.  Abdominal:      Comments: Abdomen is soft, nondistended.  She still has some tenderness of mild nature in the epigastric region, less so over the left upper quadrant, none of the right upper quadrant.  No lower quadrant abdominal tenderness.  Bowel sounds normoactive x 4.  No organomegaly or masses palpable.  No rebound tenderness and a  negative Murphy sign.   Musculoskeletal:      Comments: Sits comfortably during exam.   Skin:     General: Skin is warm and dry.   Psychiatric:         Mood and Affect: Mood normal.         Behavior: Behavior normal.         Thought Content: Thought content normal.         Judgment: Judgment normal.         ASSESSMENT/PLAN:  1. Epigastric abdominal pain  Doing much better.  She still having some mild tenderness on exam but she is overall feeling much better today.  Her labs are reassuring, no evidence of pancreatitis.  She will advance her diet as tolerated.  She will continue to hold her semaglutide  dose, next due 8/12.  She can even hold that dose if she is having any remaining symptoms.  She will follow-up here if symptoms worsen or change.      FOLLOW UP:  Return if symptoms worsen or fail to improve, for has diabetes follow up 11/28/24.    Medical Decision Making (Two out of 3):  Number of Problems: Moderate: 1 or more chronic problem with exacerbation/progression OR 2 stable OR one new problem with uncertain prognosis OR one systemic acute illness OR 1  acute complicated injury  Data Reviewed: Any 3: as above, see orders (if any)  Risk of morbidity for testing/treatment: Moderate risk: Prescription drug management      Progress notes authenticated by Jama MARLA Maxwell, NP. Portions of this note were prepared with Paediatric nurse. Occasional phonetic and grammatical errors may have escaped proofreading.          [1]   Past Medical History:  Diagnosis Date    Diabetes (HCC)     Erythema nodosum     H/O mammogram 09/10/2016    Kidney stones     PFS (patellofemoral syndrome)     Knee pain    PONV (postoperative nausea and vomiting)     RAD (reactive airway disease)    [2]   Past Surgical History:  Procedure Laterality Date    ------------OTHER------------- Right 02/2018    rotator cuff    COLONOSCOPY  07/08/2012    WNL    COLONOSCOPY  02/24/2015    ESOPHAGOGASTRODUODENOSCOPY (EGD)  02/24/2015     LIGATION, FALLOPIAN TUBE, BILATERAL, USING TUBAL RING  1994    PR ARTHRS KNEE DEBRIDEMENT/SHAVING ARTCLR CRTLG Right 12/24/2020    PR ARTHRS KNEE W/MENISCECTOMY MED&LAT W/SHAVING Right 12/24/2020   [3]   Current Outpatient Medications:     Acetaminophen (TYLENOL) 500 mg Tablet, Take 650 mg by mouth every 6 hours if needed for pain or Fever., Disp: , Rfl:     Azelastine  Nasal (ASTELIN ) 137 mcg (0.1 %) Spray, INSTILL ONE SPRAY INTO EACH NOSTRIL TWO TIMES DAILY., Disp: 90 mL, Rfl: 3    Gabapentin  (NEURONTIN ) 400 mg Capsule, Take 1 capsule by mouth 2 times daily., Disp: 360 capsule, Rfl: 0    Lisinopril  (PRINIVIL , ZESTRIL ) 2.5 mg Tablet, Take 1 tablet by mouth every day., Disp: 90 tablet, Rfl: 3    Metformin  (GLUCOPHAGE ) 1,000 mg tablet, Take 1 tablet by mouth 2 times daily with meals., Disp: 180 tablet, Rfl: 3    Metronidazole  (METROGEL ) 1 % gel, Apply to the affected area every day. Use a thin layer to affected areas after washing, Disp: 45 g, Rfl: 0    MULTIVITAMIN PO, Take 1 tablet by mouth every morning. Twice a day, Disp: , Rfl:     Psyllium Husk 0.52 gram Capsule, Take 1 capsule by mouth every morning., Disp: , Rfl:     semaglutide  1 mg/dose (4 mg/3 mL) Pen Injector, Inject 1 mg subcutaneously one time each week. Check flow before first injection with each new pen., Disp: 9 mL, Rfl: 3    Simvastatin  (ZOCOR ) 10 mg Tablet, Take 1 tablet by mouth every day., Disp: 90 tablet, Rfl: 3    TURMERIC PO, Take 1 capsule by mouth every day at bedtime., Disp: , Rfl:   [4]   Allergies  Allergen Reactions    Clindamycin Nausea/Vomiting

## 2024-05-14 NOTE — Progress Notes (Signed)
 reviewed

## 2024-05-17 ENCOUNTER — Encounter (HOSPITAL_BASED_OUTPATIENT_CLINIC_OR_DEPARTMENT_OTHER): Payer: Self-pay | Admitting: Internal Medicine

## 2024-05-17 DIAGNOSIS — J019 Acute sinusitis, unspecified: Secondary | ICD-10-CM

## 2024-05-17 MED ORDER — AZELASTINE 137 MCG (0.1 %) NASAL SPRAY: 1.0000 | Freq: Two times a day (BID) | NASAL | 0 refills | Status: DC

## 2024-05-17 NOTE — Telephone Encounter (Signed)
 Last ordered: 1 year ago (05/20/2022     BP Readings from Last 1 Encounters:   05/09/24 126/86      Pulse Readings from Last 1 Encounters:   05/09/24 107     Lab Results   Component Value Date    WBC 4.9 05/09/2024    HGB 13.7 05/09/2024    PLT 232 05/09/2024    NA 138 05/09/2024    K 4.2 05/09/2024    GLU 101 05/09/2024    CR 0.70 05/09/2024    ALT 16 05/09/2024     Recent Visits  Date Type Provider Dept   05/09/24 Office Visit [name], [facility]   05/08/24 Telephone [name], [facility]   04/25/24 Office Visit [name]   01/18/24 Office Visit [name]   12/19/23 Telephone [name]   11/23/23 Telephone [name]   07/18/23 Office Visit [name]   Showing recent visits within past 365 days and meeting all other requirements  Future Appointments  No visits were found meeting these conditions.  Showing future appointments within next 180 days and meeting all other requirements  2.25.26 [facility] [address]           User Date Status   LOVENIA ANDRIETTE REYMUNDO GUSTAV GAILE BUTLER TAYLOR 10/21/2020 12:14 PM  10/29/2020  4:54 PM  12/05/2020 12:43 PM Reviewed PDMP [1]  Reviewed PDMP [1]  Reviewed PDMP [1]               Current Medications  has a current medication list which includes the following prescription(s): acetaminophen, azelastine  nasal, gabapentin , lisinopril , metformin , metronidazole , multivitamin, psyllium husk, semaglutide , simvastatin , and turmeric.

## 2024-06-07 ENCOUNTER — Other Ambulatory Visit (HOSPITAL_BASED_OUTPATIENT_CLINIC_OR_DEPARTMENT_OTHER): Payer: Self-pay | Admitting: Adult Health

## 2024-06-07 DIAGNOSIS — E1142 Type 2 diabetes mellitus with diabetic polyneuropathy: Secondary | ICD-10-CM

## 2024-06-07 DIAGNOSIS — R21 Rash and other nonspecific skin eruption: Secondary | ICD-10-CM

## 2024-06-07 NOTE — Telephone Encounter (Signed)
 Last refill: 01/24/2024     BP Readings from Last 1 Encounters:   05/09/24 126/86      Pulse Readings from Last 1 Encounters:   05/09/24 107     Lab Results   Component Value Date    WBC 4.9 05/09/2024    HGB 13.7 05/09/2024    PLT 232 05/09/2024    NA 138 05/09/2024    K 4.2 05/09/2024    GLU 101 05/09/2024    CR 0.70 05/09/2024    ALT 16 05/09/2024     Recent Visits  Date Type Provider Dept   05/09/24 Office Visit [name], NP [facility]   05/08/24 Telephone [name], NP [facility]   04/25/24 Office Visit [name]   01/18/24 Office Visit [name]   12/19/23 Telephone [name]   11/23/23 Telephone [name]   07/18/23 Office Visit [name]   Showing recent visits within past 365 days and meeting all other requirements  Future Appointments  Date Type Provider Dept   11/28/24 Appointment [name]   Showing future appointments within next 180 days and meeting all other requirements        [facility] [address]           User Date Status   BARRACLOUGH, JENNA  LEWKOWITZ, AUDREY  VANCE, WARREN TAYLOR 10/21/2020 12:14 PM  10/29/2020  4:54 PM  12/05/2020 12:43 PM Reviewed PDMP [1]  Reviewed PDMP [1]  Reviewed PDMP [1]               Current Medications  has a current medication list which includes the following prescription(s): acetaminophen, azelastine  nasal, gabapentin , lisinopril , metformin , metronidazole , multivitamin, psyllium husk, semaglutide , simvastatin , and turmeric.

## 2024-06-07 NOTE — Telephone Encounter (Signed)
 Last refill: 11/11/2023       Recent Visits  Date Type Provider Dept   05/09/24 Office Visit [name]   05/08/24 Telephone [name], [facility]   04/25/24 Office Visit [name]   01/18/24 Office Visit [name]   12/19/23 Telephone [name]   11/23/23 Telephone [name]   07/18/23 Office Visit [name]   Showing recent visits within past 365 days and meeting all other requirements  Future Appointments  Date Type Provider Dept   11/28/24 Appointment [name]   Showing future appointments within next 180 days and meeting all other requirements     Psychiatric Medication              Gabapentin  (NEURONTIN ) 400 mg Capsule Take 1 capsule by mouth 2 times daily.

## 2024-06-18 ENCOUNTER — Inpatient Hospital Stay (INDEPENDENT_AMBULATORY_CARE_PROVIDER_SITE_OTHER)
Admission: RE | Admit: 2024-06-18 | Discharge: 2024-06-18 | Disposition: A | Discharge status: Home / Self Care | Source: Ambulatory Visit

## 2024-06-18 ENCOUNTER — Encounter (INDEPENDENT_AMBULATORY_CARE_PROVIDER_SITE_OTHER): Payer: Self-pay

## 2024-06-18 ENCOUNTER — Ambulatory Visit (INDEPENDENT_AMBULATORY_CARE_PROVIDER_SITE_OTHER)

## 2024-06-18 VITALS — HR 96 | Temp 97.2°F | Ht 64.0 in | Wt 170.0 lb

## 2024-06-18 MED ORDER — TRAMADOL 50 MG TABLET
50.0000 mg | ORAL_TABLET | Freq: Four times a day (QID) | ORAL | 0 refills | Status: AC | PRN
Start: 2024-06-18 — End: 2024-07-18

## 2024-06-18 MED ORDER — IBUPROFEN 600 MG TABLET
600.0000 mg | ORAL_TABLET | Freq: Three times a day (TID) | ORAL | 0 refills | Status: AC | PRN
Start: 2024-06-18 — End: 2024-07-18

## 2024-06-18 NOTE — Progress Notes (Unsigned)
 ORTHOPEDIC WALK-IN CLINIC NOTE  PATIENT: Amanda Williamson  MR#: [id number]  SEX: female, AGE: 40yr  DATE OF BIRTH: 03/02/1966  SERVICE DATE: 06/18/2024    Chief Complaint   Patient presents with    Hip Problem     Patient states she was walking down the steps and her knee gave out. She heard a pop, and now she has pain in the hip. Swelling is present. Pain is on the right side of the hip and goes down into the buttock.       History of Present Illness  Amanda Williamson is a 58 year old with knee issues who presents with right leg pain and swelling.    She experienced a pop in her right knee while descending stairs at her daughter's house, followed by immediate pain. She did not trip or fall but felt the knee give out. The pain started on Friday and worsened by Saturday, making it difficult for her to walk.    The pain originates from the buttock and radiates down to the knee, primarily on the right side. She has a history of similar pain and has previously undergone treatment with a provider named Amanda Williamson, who helped alleviate her symptoms through physical therapy.    She has been using a knee brace for support and has applied Voltaren gel for pain relief, though it has not been very effective. She also takes Tylenol Arthritis as needed. Her current medications include gabapentin  for neuropathy, metformin  for diabetes, lisinopril , and simvastatin .    She is active, often walking with her daughter and grandchildren, and helps care for her grandchildren, which keeps her busy throughout the day. She experiences fatigue and pain during walks, and her family encourages her to rest when needed. She has been elevating her leg and meditating to manage her symptoms.    ROS: A 14 point review of systems is reviewed and is negative with the exception of what is listed in the HPI.    PHYSICAL EXAMINATION  GEN: Well appearing, in no apparent distress, AOx3  RESP:  respirations equal, unlabored    Physical Exam  MUSCULOSKELETAL:   Right  Hip:  Inspection: No effusion or erythema, no ecchymosis present, no significant swelling  Wounds: no surgical wounds or scars are appreciated  Palpation: + TTP over the GT and posteriorly, NTTP over the groin  ROM:  Flexion: 120  Extension: 20  Abduction: 30 limited 2/2 pain   Adduction: 25  Strength: 4+/5 flexion, 5/5 extension, 4/5 abduction  Neurovascular: intact  Special Tests  (-) Stinchfields   (+) Ober's  (-) Log roll  (-) Thomas Test  (+) Trendelenburg    Right knee:  Inspection:  no effusion or erythema, no ecchymosis present; some swelling to the calf without erythema, pain or TTP  Alignment:  normal  Palpation: TTP the MJL   ROM: 0-125  Strength: 5/5 flexion, 5/5 extension  Stability:  all ligaments appear stable  Leg examination: NVI intact distally      DIAGNOSTIC IMAGING:    3 images of the right hip and pelvis personally reviewed by myself reveal no acute frx or dislocation in the presence of osteoarthritis of bilateral hips, right worse than left, aeb joint space narrowing, subchondral sclerosis and osteophyte formation. No evidence of AVN. There are focal calcifications adjacent to the greater trochanter c/w calcific tendinopathy.    Assessment & Plan  [name] is a 58yr old female with:  Right iliotibial band syndrome and trochanteric bursitis/gluteal tendinopathy  She experiences chronic right hip and lateral thigh pain, worsened by a recent incident, with tenderness over the lateral hip. This is likely due to inflammation of the proximal iliotibial band and trochanteric bursa. Refer her to physical therapy for strengthening and symptom management as this has worked very well for her in the past. Consider a cortisone injection in 4-6 weeks if tenderness persists. Advise using Voltaren gel for its topical anti-inflammatory effect. Prescribe tramadol  for pain management and ibuprofen  for its anti-inflammatory effect advising patient not to use Voltaren if taking ibuprofen  as these are  both NSAIDs and concurrent use can damage the kidneys.    Unilateral primary osteoarthritis of the right knee    She has chronic right knee pain with osteoarthritis, recently worsened after a popping incident. Emphasize delaying knee replacement surgery if possible and the importance of knee support and anti-inflammatory management. Discuss the impact of knee pain on her gait and the potential for further complications if not managed properly. Advise using a knee brace for support. Prescribe ibuprofen  for its anti-inflammatory effect and advise on using tramadol  for pain management as needed.      We discussed the patient's diagnosis and treatment options in detail today. This included a description of the associated pathology and non-operative/operative treatment options with the use of illustrations and diagrams when possible.    EDUCATION:   I have educated/instructed the patient/family regarding all aspects of the above stated plan of care. The patient indicates understanding and agreement.    The patient was encouraged to contact the office with any questions, concerns, or worrisome change of symptoms in the interim.        Patient was informed that an AI tool will be used during this visit to record and transcribe the encounter. Patient was informed of and acknowledged potential risks and that they can opt out at any time. Patient verbally consented to the use of AI.    Electronically signed at 13:03 on 06/18/2024 by:      Harlene HERO. Olita, NP-BC  Department of Orthopaedic Surgery  [address]  [place], [place] [address]  [phone]

## 2024-06-18 NOTE — Patient Instructions (Addendum)
 VISIT SUMMARY:  Today, we discussed the pain and swelling in your right leg, which started after you felt a pop in your knee while descending stairs. We reviewed your history of similar pain and the treatments you have tried, including physical therapy, knee braces, and medications. We also talked about your active lifestyle and how your symptoms are affecting your daily activities.    YOUR PLAN:  -RIGHT ILIOTIBIAL BAND SYNDROME AND TROCHANTERIC BURSITIS: This condition involves inflammation of the iliotibial band and the trochanteric bursa, causing pain in your right hip and thigh. We recommend starting physical therapy to strengthen the area and manage symptoms. If the pain persists, we may consider a cortisone injection in 4-6 weeks. Continue using Voltaren gel for its anti-inflammatory effects, and we have prescribed tramadol  for pain and ibuprofen  for inflammation.    -UNILATERAL PRIMARY OSTEOARTHRITIS OF THE RIGHT KNEE: Osteoarthritis is a condition where the cartilage in your knee wears down over time, causing pain and stiffness. To manage this, we suggest continuing to use a knee brace for support and taking ibuprofen  for inflammation. Tramadol  can be used for pain management as needed. We discussed the importance of delaying knee replacement surgery if possible and managing your symptoms to prevent further complications.    INSTRUCTIONS:  Please follow up with physical therapy as recommended. If your symptoms do not improve, we may consider a cortisone injection in 4-6 weeks. Continue using your knee brace and medications as prescribed. If you have any new or worsening symptoms, please contact our office.

## 2024-06-21 ENCOUNTER — Other Ambulatory Visit (HOSPITAL_BASED_OUTPATIENT_CLINIC_OR_DEPARTMENT_OTHER): Payer: Self-pay | Admitting: Adult Health

## 2024-07-03 NOTE — Addendum Note (Signed)
 Addended by: OLITA RAISIN on: 07/03/2024 03:40 PM     Modules accepted: Level of Service

## 2024-07-10 ENCOUNTER — Encounter (INDEPENDENT_AMBULATORY_CARE_PROVIDER_SITE_OTHER): Payer: Self-pay | Admitting: Adult Health

## 2024-07-10 NOTE — Telephone Encounter (Signed)
 Patient is going to [place], October 28th- November 22nd , and would like to know if she should bring her  Ozempic  medication with her? Please read full mychart message for better context. Please advise

## 2024-07-19 ENCOUNTER — Other Ambulatory Visit (INDEPENDENT_AMBULATORY_CARE_PROVIDER_SITE_OTHER): Payer: Self-pay

## 2024-07-19 ENCOUNTER — Encounter (INDEPENDENT_AMBULATORY_CARE_PROVIDER_SITE_OTHER): Payer: Self-pay | Admitting: Student in an Organized Health Care Education/Training Program

## 2024-07-19 ENCOUNTER — Ambulatory Visit (INDEPENDENT_AMBULATORY_CARE_PROVIDER_SITE_OTHER): Admitting: Student in an Organized Health Care Education/Training Program

## 2024-07-19 VITALS — HR 92 | Temp 97.8°F | Wt 170.0 lb

## 2024-07-19 DIAGNOSIS — M7631 Iliotibial band syndrome, right leg: Secondary | ICD-10-CM

## 2024-07-19 DIAGNOSIS — M1711 Unilateral primary osteoarthritis, right knee: Secondary | ICD-10-CM

## 2024-07-19 DIAGNOSIS — M25551 Pain in right hip: Secondary | ICD-10-CM

## 2024-07-19 DIAGNOSIS — G8929 Other chronic pain: Secondary | ICD-10-CM

## 2024-07-19 MED ORDER — METHYLPREDNISOLONE ACETATE 40 MG/ML SUSPENSION FOR INJECTION
40.0000 mg | Freq: Once | INTRAMUSCULAR | Status: AC
Start: 2024-07-19 — End: 2024-07-19
  Administered 2024-07-19: 40 mg via INTRA_ARTICULAR

## 2024-07-19 NOTE — Progress Notes (Signed)
 Orthopedic Clinic Note      REASON FOR VISIT:   Chief Complaint   Patient presents with    Knee Problem     Right knee     Hip Problem     Right hip         History of Present Illness  Amanda Williamson is a 58 year old with mild osteoarthritis who presents with hip and knee pain.    She experiences pain in both her hip and knee, describing it as traveling pain. Approximately two and a half months ago, she heard a crack in her knee after a misstep, although she did not fall. Since then, the pain has radiated from her knee to her hip, particularly affecting the right side, including the buttock area. The pain is present when walking and sometimes causes her knee to buckle.    She consistently wears a knee brace, which she finds helpful, although she occasionally removes it when not walking. The pain is tolerable at times but emphasizes the need to wear the brace at all times now due to knee instability. She believes the pain originates from her knee, as her hip was not hurting initially when the knee pain began.    She underwent an orthopedic procedure on her knee about three years ago, which initially improved her condition, but she now struggles with bending and rising from a seated position without assistance.    She also reports neuropathy in both legs, which started in her feet and has since progressed. This condition contributes to her difficulty in walking and managing her knee pain. She experiences swelling in her knee, which she describes as becoming 'very fat' and painful, with the pain shooting upwards.    She has had steroid injections in the past, which she found helpful. She is currently planning a trip to [place] with her mother.      Past Surgical History[1]     Past Medical History[2]      Physical Exam  MUSCULOSKELETAL: Right knee with prior arthroscopic scars over medial and lateral portals. Subtle valgus alignment, worse in flexion and extension. Right knee alignment is neutral. Right knee is stable.  Mild lateral joint line tenderness in right knee. No medial joint line tenderness in right knee. Extensor mechanism intact with normal patellar tracking in right knee. Full range of motion in right knee. Mild tenderness to palpation at right greater trochanter. Mild right-sided hip pain with FADIR and FABER testing.        Results  RADIOLOGY  Hip X-ray: Early arthritis with joint space narrowing and osteophytes  Knee X-ray: Mild osteoarthritis with lateral joint space narrowing        Assessment & Plan  Right knee osteoarthritis  Mild osteoarthritis with inflammation, pain, swelling, and crepitus. Previous arthroscopic surgery. No indication for knee replacement.  - Administer steroid injection to the right knee.  - Continue physical therapy with Norleen after trip.  - Advise consistent use of knee brace.    Right hip osteoarthritis  Early osteoarthritis with joint space narrowing and bone spurs. Mild pain with FADIR and FABER testing.  - Manage symptoms conservatively.    Summary: Amanda Williamson's 58 she underwent a prior right knee arthroscopy by Dr. Gaile she has some mild lateral compartment osteoarthritis, she also has some mild right hip osteoarthritis these combined to create some soft tissue inflammation affecting her right IT band.  We did a corticosteroid injection in the right knee today and referred her to PT.  After obtaining verbal consent, confirming the diagnosis, indication for treatment, laterality, and drug allergies, the R knee joint prepped with chloraprep and topical anesthesia delivered with ethylene chloride sprain, and using sterile technique a 22-gauge needle was used to inject 40 mg of depomedrol and 2 ml 1% plain lidocaine , 2 ml of 0.25% marcaine into the knee joint.  A bandage was applied, the patient tolerated the procedure well with no known complications.    [name] Dinning, MD    Orthopedic Sports Medicine Specialist  [address] [place], [place]  [place], [place]  [address]  [phone]    This note was generated using voice-to-text technology.  The best effort was made to avoid grammatical errors, but they still may exist.    Patient was informed that an AI tool will be used during this visit to record and transcribe the encounter. Patient was informed of and acknowledged potential risks and that they can opt out at any time. Patient verbally consented to the use of AI.           [1]   Past Surgical History:  Procedure Laterality Date    ------------OTHER------------- Right 02/2018    rotator cuff    COLONOSCOPY  07/08/2012    WNL    COLONOSCOPY  02/24/2015    ESOPHAGOGASTRODUODENOSCOPY (EGD)  02/24/2015    LIGATION, FALLOPIAN TUBE, BILATERAL, USING TUBAL RING  1994    PR ARTHRS KNEE DEBRIDEMENT/SHAVING ARTCLR CRTLG Right 12/24/2020    PR ARTHRS KNEE W/MENISCECTOMY MED&LAT W/SHAVING Right 12/24/2020   [2]   Past Medical History:  Diagnosis Date    Diabetes (HCC)     Erythema nodosum     H/O mammogram 09/10/2016    Kidney stones     PFS (patellofemoral syndrome)     Knee pain    PONV (postoperative nausea and vomiting)     RAD (reactive airway disease)

## 2024-07-21 ENCOUNTER — Encounter (HOSPITAL_BASED_OUTPATIENT_CLINIC_OR_DEPARTMENT_OTHER): Payer: Self-pay | Admitting: Adult Health

## 2024-07-21 DIAGNOSIS — E669 Obesity, unspecified: Secondary | ICD-10-CM

## 2024-09-03 ENCOUNTER — Ambulatory Visit
Attending: Student in an Organized Health Care Education/Training Program | Admitting: Rehabilitative and Restorative Service Providers"

## 2024-09-03 NOTE — Allied Health Progress (Addendum)
 PHYSICAL THERAPY EVALUATION 09/03/2024     Diagnosis:  Therapy diagnosis:   M17.11 (ICD-10-CM) - Primary osteoarthritis of right knee   M76.31 (ICD-10-CM) - It band syndrome, right   M25.561, right knee pain   Medical diagnosis:   M17.11 (ICD-10-CM) - Primary osteoarthritis of right knee   M76.31 (ICD-10-CM) - It band syndrome, right       Authorizing MD (First and last name) and PI#: Dr Lonni Dinning  Onset Date: 06/15/2024     Start of Care Date: 09/03/2024   Prior Level of function: from July to September 2025 no right knee and right thigh pain after receiving physical therapy.  Prior treatment for this diagnosis: yes  Rehab potential: Good    Precautions: peripheral neuropathy  Type 2 DM    Patient's goals are to decrease her right hip, thigh and knee pain.      Plan of care:  Established 09/03/2024   1 times a week for a total of 10 treatments.   To include   Manual therapy for soft tissue mobilization  Therapeutic exercises for stretches and strengthening exercises bilateral LE's  Home Exercise Program  Patient/Caregiver Education     Goals:   Patient will be able to walk 20 minutes without right knee and right lateral thigh pain 3/10 at worst.  Patient will be able to ascend/descend 5 steps in and out of mobile home without right knee and right lateral thigh pain 3/10 at worst.  Patient will be independent with home exercise program and home recommendations   Patient will be able to tolerate 10 minutes in right sidelying position.                Total Evaluation time: 30 minutes   Treatment time in addition to evaluation: 25 minutes           Clinical Evaluation     SUBJECTIVE  History of current problem:                          Amanda Williamson is a 58yr old female who on 06/15/2024 was walking down some stairs at her daughter's house when she heard her right knee pop. She experienced severe right knee pain and she found it difficult to walk.  Received right knee cortisone injection on 07/19/2024 from Dr  Dinning which helped reduce her knee pain  She complains of constant aching pain over her right gluteals, over her right iliotibial band rated 6/10 at worst and 3/10 at best.   Intermittent pain over her right knee rated 1/10 art worst since getting a cortisone injection.  She states her right leg feels weak.  Aggravating factors include: walking 10 minutes, prolonged sitting then getting up to walk, ascending and descending stairs  Cannot tolerate right sidelying due to right hip pain, cannot kneel  Easing Factors include: Voltaren gel        Medical History/Co-morbidities effecting the plan of care: see below  Past Medical History:  No date: Diabetes (HCC)  No date: Erythema nodosum  09/10/2016: H/O mammogram  No date: Kidney stones  No date: PFS (patellofemoral syndrome)  No date: PONV (postoperative nausea and vomiting)  No date: RAD (reactive airway disease)    Past Surgical History[1]        Social History/Personal and/or environmental factors effecting the plan of care:   Lives in mobile home with 5 steps to enter.     OBJECTIVE EXAM:  Palpation:   Tender right iliotibial band, right gluteus medius and gluteus maximus      Gait:  Assistive device used: no assistive device     Gait assessment (description of gait pattern): slow antalgic gait pattern, right leg in ER    Range of motion (in degrees)   Active  Passive  Normal    hip Left Right Left Right    Flexion 90 90 pain   125   Extension  15 15   30    Adduction  30 30  pain   30   Abduction 40 35 pain   30-50   Internal rotation 15 0 pain   40   External rotation 40 25 pain   60                     Active (Degrees)    Passive (Degrees) Normal range    knee Left Right Left Right     flexion 120 112 pain NT NT 140   extension 0 0 pain NT NT 0-10     Moderate tightness right quadriceps and hip flexors      Strength:  MMT out of 5  hip Left Right   Flexion 5 4-   Extension 5 4-   Adduction  5 4-   Abduction 5 4-   Internal rotation 5 4-   External rotation   5 4-                 Strength MMT  Out of 5 scale   knee Left Right   Flexion  5 4   Extension  5 4-             Special Tests:    Left  Right  Reason for test Position of test   Yeoman's Test: Not tested Not tested Provocation test for SI joint dysfunction.    Prone: stabilize contralateral hip and pull upward on anterior thigh   Patrick's Test  (FABER) Not tested Not tested Provocation test for ipsilateral hip dysfunction, or contralateral SI joint dysfunction   Supine: Stabilize contralateral pelvis.  Flex, abduct and ER hip. (figure 4 position)     Femoral- acetabular impingement Test (FADIR) Not tested Not tested Provocation for hip impingement (groin pain)     Supine: Hip flexed and adducted across midline with Internal Rotation     Ober's Test negative negative Provocation test for TFL or IT band tightness     Sidelying: bottom hip/knee flexed, top hip at 0 with knee flexed.  Check available adduction   Straight Leg Raise negative negative Provocation test for sciatic nerve irritation (positive at 30-60 degrees)   Supine raise one leg up keeping it straight from 0-70 degrees.  (can be sensitized)            Treatment today in addition to evaluation(if applicable):    Manual therapy 25 minutes for soft tissue mobilization to right gluteals and iliotibial band.    Patient was educated in the anatomy and physiology involved.  Common symptoms, treatments, responses to treatment, and self management were discussed with the patient.       ASSESSMENT: The patient presents with right hip pain and right knee that is limiting function. The signs and symptoms are consistent with pain from right iliotibial band syndrome and right knee osteoarthritis.    Benefit from physical therapy for manual therapy for soft tissue mobilization, therapeutic exercises for stretches and strengthening exercises bilateral LE's to  decrease right hip and right knee pain and increase walking tolerance.      Clinical Presentation:  Stable/Uncomplexed     The components of this evaluation necessitated a Low complexity level of clinical decision making.      Plan for next visit: teach strengthening exercises right LE.    Patient Education:      Method of Teaching: demonstration, verbal and written hand-out     Learner: patient     Response: verbalizes understanding and able to give return demonstration       Has the plan of care been explained to the patient/caregiver? yes       Is the patient able to understand the plan of care: yes       Does the patient/caregiver(s) agree with the plan of care: yes       Are there barriers to learning? no.         Motivated to learn? yes       Best learning method: verbal, or demo      Norleen Henry, PT  Evaluation time: 1301 to 1331  Treatment start time: 1331               Treatment stop time: 1356         [1]   Past Surgical History:  Procedure Laterality Date    ------------OTHER------------- Right 02/2018    rotator cuff    COLONOSCOPY  07/08/2012    WNL    COLONOSCOPY  02/24/2015    ESOPHAGOGASTRODUODENOSCOPY (EGD)  02/24/2015    LIGATION, FALLOPIAN TUBE, BILATERAL, USING TUBAL RING  1994    PR ARTHRS KNEE DEBRIDEMENT/SHAVING ARTCLR CRTLG Right 12/24/2020    PR ARTHRS KNEE W/MENISCECTOMY MED&LAT W/SHAVING Right 12/24/2020

## 2024-09-05 ENCOUNTER — Ambulatory Visit

## 2024-09-05 NOTE — Addendum Note (Signed)
 Addended by: ALENA NORLEEN DEL on: 09/05/2024 08:51 AM     Modules accepted: Orders

## 2024-09-14 ENCOUNTER — Ambulatory Visit: Admitting: Rehabilitative and Restorative Service Providers"

## 2024-09-14 NOTE — Allied Health Progress (Signed)
 Physical Therapy Treatment Note: 09/14/2024            Diagnosis:  Therapy diagnosis:   M17.11 (ICD-10-CM) - Primary osteoarthritis of right knee   M76.31 (ICD-10-CM) - It band syndrome, right   M25.561, right knee pain   Medical diagnosis:   M17.11 (ICD-10-CM) - Primary osteoarthritis of right knee   M76.31 (ICD-10-CM) - It band syndrome, right         Authorizing MD (First and last name) and PI#: Dr Lonni Dinning  Onset Date: 06/15/2024     Start of Care Date: 09/03/2024   Prior Level of function: from July to September 2025 no right knee and right thigh pain after receiving physical therapy.  Prior treatment for this diagnosis: yes  Rehab potential: Good     Precautions: peripheral neuropathy  Type 2 DM     Patient's goals are to decrease her right hip, thigh and knee pain.        Plan of care:  Established 09/03/2024              1 times a week for a total of 10 treatments.   To include   Manual therapy for soft tissue mobilization  Therapeutic exercises for stretches and strengthening exercises bilateral LE's  Home Exercise Program  Patient/Caregiver Education     Goals set on 09/03/2024  Patient will be able to walk 20 minutes without right knee and right lateral thigh pain 3/10 at worst.  Patient will be able to ascend/descend 5 steps in and out of mobile home without right knee and right lateral thigh pain 3/10 at worst.  Patient will be independent with home exercise program and home recommendations   Patient will be able to tolerate 10 minutes in right sidelying position.              Total # of visits: 2/10     Treatment start time: 1400                 Treatment stop time: 1430     Total Treatment Time: 30 minutes        Subjective: mild right hip pain, no right knee pain today.     What matters most for today's session:  decrease right hip pain, increase right leg strength.    Objective: Patient seen for the following treatment:   Instructed on and completed therapeutic exercises 15 minutes as  follows,  Bridging x10 reps 5 second hold  Bridging on physio ball x10 reps 5 second hold  Hooklying hip abduction using L4TB x10 reps 5 second hold  Right hip abduction in left sidelying x10 reps 3 second hold  Right hamstrings stretch in supine, right iliotibial band stretch and right gluteal stretch 3x30 seconds each.  Manual therapy 15 minutes for soft tissue mobilization to right gluteals, right hip flexors and iliotibial band.         Assessment: some tightness and tenderness right gluteals, right hip flexors and iliotibial band.    Plan: continue physical therapy for stretches and strengthening exercises       Amanda Williamson, PT

## 2024-09-20 ENCOUNTER — Encounter (HOSPITAL_BASED_OUTPATIENT_CLINIC_OR_DEPARTMENT_OTHER): Payer: Self-pay

## 2024-09-21 ENCOUNTER — Ambulatory Visit: Admitting: Rehabilitative and Restorative Service Providers"

## 2024-09-21 ENCOUNTER — Ambulatory Visit (HOSPITAL_BASED_OUTPATIENT_CLINIC_OR_DEPARTMENT_OTHER)

## 2024-09-21 VITALS — BP 126/78 | HR 103 | Temp 97.8°F | Resp 16 | Ht 64.0 in | Wt 172.0 lb

## 2024-09-21 LAB — POC RAPID COVID-19 ANTIGEN (MMC): POC Rapid Covid-19 Antigen (MMC): NOT DETECTED

## 2024-09-21 MED ORDER — DEXTROMETHORPHAN-GUAIFENESIN ER 60 MG-1,200 MG TAB,EXTEND RELEASE,12HR: 1.0000 | EXTENDED_RELEASE_CAPSULE | Freq: Two times a day (BID) | ORAL | 0 refills | Status: AC | PRN

## 2024-09-21 NOTE — Allied Health Progress (Signed)
 Physical Therapy Treatment Note: 09/21/2024            Diagnosis:  Therapy diagnosis:   M17.11 (ICD-10-CM) - Primary osteoarthritis of right knee   M76.31 (ICD-10-CM) - It band syndrome, right   M25.561, right knee pain   Medical diagnosis:   M17.11 (ICD-10-CM) - Primary osteoarthritis of right knee   M76.31 (ICD-10-CM) - It band syndrome, right         Authorizing MD (First and last name) and PI#: Dr Lonni Dinning  Onset Date: 06/15/2024     Start of Care Date: 09/03/2024   Prior Level of function: from July to September 2025 no right knee and right thigh pain after receiving physical therapy.  Prior treatment for this diagnosis: yes  Rehab potential: Good     Precautions: peripheral neuropathy  Type 2 DM     Patient's goals are to decrease her right hip, thigh and knee pain.        Plan of care:  Established 09/03/2024              1 times a week for a total of 10 treatments.   To include   Manual therapy for soft tissue mobilization  Therapeutic exercises for stretches and strengthening exercises bilateral LE's  Home Exercise Program  Patient/Caregiver Education     Goals set on 09/03/2024  Patient will be able to walk 20 minutes without right knee and right lateral thigh pain 3/10 at worst.  Patient will be able to ascend/descend 5 steps in and out of mobile home without right knee and right lateral thigh pain 3/10 at worst.  Patient will be independent with home exercise program and home recommendations   Patient will be able to tolerate 10 minutes in right sidelying position.              Total # of visits: 3/10     Treatment start time: 900                 Treatment stop time: 928     Total Treatment Time: 28 minutes        Subjective: patient is sick and feels pain all over body today.     What matters most for today's session:  decrease right hip pain, increase right leg strength.     Objective: Patient seen for the following treatment:   Manual therapy 28 minutes for soft tissue mobilization to right  gluteals, right hip flexors and iliotibial band.           Assessment: increased tenderness over above areas as patient is feeling sick.     Plan: resume physical therapy for stretches and strengthening exercises next treatment in 2 weeks.     Fiana Gladu, PT

## 2024-09-21 NOTE — Progress Notes (Signed)
 Family Practice Clinic Visit     Date: 09/21/2024    HPI:  Amanda Williamson is a 37yr female w/pmhx of HTN, HLD, T2DM with polyneuropathy, seasonal allergies, reactive airway disease, anxiety, depression who presents to clinic to discuss chills, aches, chest tightness.     Symptoms started yesterday with sudden onset.  She reports body aches, sinus/nasal congestion with associated headache, nonproductive cough without blood.  She has had chills last night but no fever.she notes right ear pain about 2 days ago that has since resolved.  No sore throat, dyspnea, SOB, DOE. No abdominal pain, N/V, diarrhea. Patient able to eat/drink without issue and has been trying to stay hydrated. She has tried OTC Tylenol yesterday with minimal relief.  Sick contacts include her daughter who was sick with similar symptoms about 2 weeks ago.  Recent travel includes returning from [place] about 1 month ago but has been asymptomatic until yesterday.  No pmhx of pneumonia, but reports a history of bronchitis with last episode being 2-3y ago.  She also has a pmhx of seasonal allergies (takes antihistamine) and reactive airway disease without use of inhalers. Pneumonia vaccine UTD.  Influenza vaccine was ordered 06/2024.    ROS:  Review of Systems   Constitutional:  Positive for chills. Negative for fever.   HENT:  Positive for congestion, rhinorrhea and sinus pressure. Negative for sore throat and trouble swallowing.    Respiratory:  Positive for cough and chest tightness. Negative for shortness of breath and wheezing.    Cardiovascular:  Negative for chest pain.   Gastrointestinal:  Negative for abdominal pain and diarrhea.   Musculoskeletal:  Positive for back pain and myalgias.   Neurological:  Positive for headaches.   All other systems reviewed and are negative.      Objective:    BP 126/78 (SITE: left arm, Orthostatic Position: sitting, Cuff Size: regular)   Pulse 103   Temp 36.6 C (97.8 F) (Temporal)   Resp 16   Ht 1.626 m  (5' 4)   Wt 78 kg (172 lb)   LMP 07/24/2016   SpO2 95%   BMI 29.52 kg/m   Physical Exam  Vitals reviewed.   Constitutional:       Appearance: Normal appearance.   HENT:      Right Ear: Tympanic membrane, ear canal and external ear normal.      Left Ear: Tympanic membrane, ear canal and external ear normal.      Nose: Congestion and rhinorrhea present.      Mouth/Throat:      Mouth: Mucous membranes are dry.      Pharynx: Oropharynx is clear. No oropharyngeal exudate or posterior oropharyngeal erythema.   Eyes:      Extraocular Movements: Extraocular movements intact.      Conjunctiva/sclera: Conjunctivae normal.      Pupils: Pupils are equal, round, and reactive to light.   Cardiovascular:      Rate and Rhythm: Regular rhythm. Tachycardia present.      Heart sounds: Normal heart sounds.   Pulmonary:      Effort: Pulmonary effort is normal. No respiratory distress.      Breath sounds: Normal breath sounds. No wheezing, rhonchi or rales.   Musculoskeletal:      Cervical back: Normal range of motion and neck supple. No rigidity.   Lymphadenopathy:      Cervical: No cervical adenopathy.   Skin:     General: Skin is warm and dry.   Neurological:  General: No focal deficit present.      Mental Status: She is alert and oriented to person, place, and time.        Assessment/Plan:    1. Viral upper respiratory tract infection  - Advised patient that etiology is likely viral. Provided reassurance and recommended supportive treatment.   - Patient declines testing for influenza.  - Recommended Tylenol as needed for fever, ibuprofen  for body aches.   - Recommended plenty of fluids and rest as well as vitamin C 1000mg  daily and zinc 50mg  daily for immune support.   - Discussed that antibiotic not indicated at this time given duration of symptoms.  - Return precautions discussed.    2. Acute cough  - Rx for dextromethorphan -guaifenesin  to help break up mucus and suppress cough  - Recommend use of humidifier or exposure  to steam for expectoration.   - Discussed that cough can last for extended periods of time following upper respiratory infections. Discussed return precautions.  - If no improvement in 5 to 7 days will consider chest x-ray and prescription for antibiotic if indicated given her pmhx of bronchitis and reactive airway disease.    3. COVID-19 ruled out  - POC Rapid Covid-19 Antigen [facility])    4. Tachycardia  - Similar to previous in office readings, possibly due to to clinic environment. Patient is asymptomatic.  - Encourage patient to stay well hydrated.    Return for follow up if symptoms persist/worsen.    Maurilio DOROTHA Pears, PA-C    Adequate time was given to patient for discussion/questions. Patient agrees to plan and verbalizes understanding and RTO instructions.

## 2024-09-24 ENCOUNTER — Telehealth (HOSPITAL_BASED_OUTPATIENT_CLINIC_OR_DEPARTMENT_OTHER): Payer: Self-pay

## 2024-09-24 NOTE — Telephone Encounter (Signed)
 Amanda Williamson called in stating that she still has a cough. All her other symptoms have gone away. She would like to know if cough medicine w/ codeine can be sent to [facility]. Please advise, thank you.

## 2024-09-25 NOTE — Telephone Encounter (Signed)
 Aiva returned call, relayed msg, was agreeable.

## 2024-09-25 NOTE — Telephone Encounter (Signed)
 Lvmtcb to relay message below

## 2024-09-25 NOTE — Telephone Encounter (Signed)
 Please read previous message regarding patient request for cough medicine with codeine [facility].

## 2024-09-28 ENCOUNTER — Ambulatory Visit (INDEPENDENT_AMBULATORY_CARE_PROVIDER_SITE_OTHER): Payer: Self-pay

## 2024-09-28 ENCOUNTER — Inpatient Hospital Stay (INDEPENDENT_AMBULATORY_CARE_PROVIDER_SITE_OTHER)
Admission: RE | Admit: 2024-09-28 | Discharge: 2024-09-28 | Disposition: A | Source: Ambulatory Visit | Attending: Physician Assistant

## 2024-09-28 ENCOUNTER — Ambulatory Visit (INDEPENDENT_AMBULATORY_CARE_PROVIDER_SITE_OTHER)

## 2024-09-28 VITALS — BP 118/84 | HR 64 | Temp 98.6°F | Ht 64.0 in | Wt 172.0 lb

## 2024-09-28 NOTE — Progress Notes (Signed)
 Chief Complaint   Patient presents with    Cough     Back starting hurting/burning and cough x10 days ago. Saw Maurilio Pears PA 12/19, she recommended mucinex  and tylenol. Pt continues to feel tightness in chest and back, cough is productive with yellow mucus. Feels very tired.        HPI: Amanda Williamson is a 58yr female with history of hypertension, dyslipidemia, type 2 diabetes, reactive airway disease who presents with 10 days of cough.  1/2 weeks ago started with burning sensation throughout her upper back that is partially progressed into body aches, cough and headache.  Patient states for the past 3 days she has been having clear nasal discharge, sinus congestion and is coming for reevaluation as she is currently managing only with Mucinex  and intermittent use of ibuprofen .        Past Medical History[1]    Past Surgical History[2]    Family History[3]    Social History     Socioeconomic History    Marital status: SINGLE     Spouse name: Not on file    Number of children: Not on file    Years of education: Not on file    Highest education level: Not on file   Occupational History    Not on file   Tobacco Use    Smoking status: Never    Smokeless tobacco: Never   Substance and Sexual Activity    Alcohol use: No    Drug use: No    Sexual activity: Not on file   Other Topics Concern    Not on file   Social History Narrative    Daughter Amanda Williamson. Retired [employer].     Social Drivers of Psychologist, [employer] Strain: Not on File (05/18/2020)    Received from [employer]     Financial Resource Strain: 0   Food Insecurity: Not on File (06/30/2023)    Received from [employer]     Food: 0   Transportation Needs: Not on File (05/18/2020)    Received from [employer] [facility]     Transportation: 0   Physical Activity: Not on File (05/18/2020)    Received from [facility]    Physical Activity     Physical  Activity: 0   Stress: Not on File (05/18/2020)    Received from [facility]    Stress     Stress: 0   Social Connections: Not on File (06/16/2023)    Received from [employer]    Social Connections     Connectedness: 0   Abuse: Not on file   Housing Stability: Not on File (05/18/2020)    Received from [employer]     Housing: 0       Allergies:   Clindamycin    Nausea/Vomiting    Medication: Current Medications[4]    ROS:  Review of Systems    Positives and pertinent negatives as per HPI. All other systems were reviewed and are negative.       PE:  Physical Exam  Vitals reviewed.   Constitutional:       Appearance: Normal appearance.   HENT:      Head: Normocephalic and atraumatic.   Eyes:      Extraocular Movements: Extraocular movements intact.   Cardiovascular:      Rate and Rhythm: Normal rate.   Pulmonary:  Effort: Pulmonary effort is normal.      Breath sounds: Normal breath sounds.   Skin:     General: Skin is warm and dry.   Neurological:      General: No focal deficit present.      Mental Status: She is alert and oriented to person, place, and time.         Temp: 37 C (98.6 F) (12/26 1148)  Temp src: Oral (12/26 1148)  Pulse: 64 (12/26 1148)  BP: 118/84 (12/26 1148)  Resp: --  SpO2: 98 % (12/26 1148)  Height: 162.6 cm (5' 4) (12/26 1148)  Weight: 78 kg (172 lb) (12/26 1148)    Differentials: Viral URI with cough, bacterial pneumonia, viral pneumonia, bacterial URI    Labs:   Results for orders placed or performed in visit on 09/21/24   POC Rapid Covid-19 Antigen [facility])    Collection Time: 09/21/24  9:55 AM   Result Value Ref Range    POC Rapid Covid-19 Antigen [facility]) Not Detected Not Detected, Error, Indeterminate, Invalid    POC RAPID COVID-19 QC Acceptable          Radiology:[facility] CHEST 2  VIEW  Result Date: 09/28/2024  DATE of Exam:  09/28/2024 11:57 INDICATION: Female, age 58 years. Cough. COMPARISON: Radiographs dated 07/22/2020. TECHNIQUE: 2 views of the chest were obtained FINDINGS:  The lungs are clear. The cardiac silhouette is not enlarged. The descending thoracic aorta is tortuous. No pleural abnormality is seen. No acute osseous abnormality is seen.     IMPRESSION: No acute cardiopulmonary process is detected. Electronically Signed By: Alm Fothergill, MD      Orders Placed This Encounter    [facility] CHEST 2 VIEW       Coding      MDM: Amanda Williamson is a 58yr female with cough, sinus congestion. Work up in the ED consisted of chest x-ray.     EKG: None    Procedures:                 Clinical Impression:     ICD-10-CM    1. Subacute cough  R05.2 [facility] CHEST 2 VIEW      2. Viral URI with cough  J06.9           PLAN: 58yr female findings along with testing likely consistent with viral URI with cough. Follow up and return precautions discussed with the patient as above. The patient participated in the decision making process, verbalized understanding, and no barriers to learning identified.      12:39 PM  Clinical exam shows patient with significantly runny nose but no abnormalities in regards to posterior pharynx.  Some mild chest congestion and chest x-ray is negative for any presence of pathology.  Patient's vital signs are all stable at this time primary concern is for sinus congestion with recommendations for over-the-counter medications for management in conjunction with ibuprofen  and Tylenol for any fevers and/or bodyaches.      The following medications were prescribed: none    Following referrals placed: none    Disposition:                 Disposition: Discharge. ED discharge instructions were reviewed.   Follow up: PCP.                Comment: Please note this record has been produced using speech recognition system and may contain errors related to that system including errors in grammar,  punctuation, and spelling,  as well as words and phrases that may be inappropriate. If there are any questions or concerns please feel free to contact the dictating provider for clarification.    Electronically Signed by: Ozell Fly, PA-C         [1]   Past Medical History:  Diagnosis Date    Diabetes (HCC)     Erythema nodosum     H/O mammogram 09/10/2016    Kidney stones     PFS (patellofemoral syndrome)     Knee pain    PONV (postoperative nausea and vomiting)     RAD (reactive airway disease)    [2]   Past Surgical History:  Procedure Laterality Date    ------------OTHER------------- Right 02/2018    rotator cuff    COLONOSCOPY  07/08/2012    WNL    COLONOSCOPY  02/24/2015    ESOPHAGOGASTRODUODENOSCOPY (EGD)  02/24/2015    LIGATION, FALLOPIAN TUBE, BILATERAL, USING TUBAL RING  1994    PR ARTHRS KNEE DEBRIDEMENT/SHAVING ARTCLR CRTLG Right 12/24/2020    PR ARTHRS KNEE W/MENISCECTOMY MED&LAT W/SHAVING Right 12/24/2020   [3]   Family History  Problem Relation Name Age of Onset    Arthritis Mother          Rheumitoid    Lipids Mother      Diabetes Father      Other (Other) Father          Kidney faliure    No Known Problems Sister      No Known Problems Brother      No Known Problems Brother      No Known Problems Brother      No Known Problems Daughter      No Known Problems Daughter      No Known Problems Daughter      Other (Other) Son          pulmonary    Diabetes Son          childhood   [4]   Current Outpatient Medications:     Acetaminophen (TYLENOL) 500 mg Tablet, Take 650 mg by mouth every 6 hours if needed for pain or Fever., Disp: , Rfl:     Azelastine  Nasal (ASTELIN ) 137 mcg (0.1 %) Spray, Instill 1 spray into EACH nostril 2 times daily., Disp: 90 mL, Rfl: 0    dextromethorphan -guaifenesin  60-1,200 mg ER 12 hr tablet, Take 1 tablet by mouth every 12 hours if needed (for cough)., Disp: 28 tablet, Rfl: 0    gabapentin  (Neurontin ) 400 mg capsule, TAKE 1 CAPSULE BY MOUTH TWICE A DAY, Disp: 180 capsule,  Rfl: 3    Lisinopril  (PRINIVIL , ZESTRIL ) 2.5 mg Tablet, Take 1 tablet by mouth every day., Disp: 90 tablet, Rfl: 3    Metformin  (GLUCOPHAGE ) 1,000 mg tablet, Take 1 tablet by mouth 2 times daily with meals., Disp: 180 tablet, Rfl: 3    metroNIDAZOLE  (Metrogel ) 1 % gel, USE A THIN LAYER TO AFFECTED AREAS AFTER WASHING ONCE A DAY, Disp: 60 g, Rfl: 1    MULTIVITAMIN PO, Take 1 tablet by mouth every morning. Twice a day, Disp: , Rfl:     Psyllium Husk 0.52 gram Capsule, Take 1 capsule by mouth every morning., Disp: , Rfl:     semaglutide  1 mg/dose (4 mg/3 mL) Pen Injector, Inject 1 mg subcutaneously one time each week. Check flow before first injection with each new pen., Disp: 9 mL, Rfl: 3    Simvastatin  (ZOCOR ) 10 mg Tablet, Take 1 tablet  by mouth every day., Disp: 90 tablet, Rfl: 3    TURMERIC PO, Take 1 capsule by mouth every day at bedtime., Disp: , Rfl:

## 2024-09-28 NOTE — Patient Instructions (Signed)
 Viral URI management - Please use over-the-counter antihistamine such as cetirizine, levocetirizine or fexofenadine in conjunction with intranasal steroids such as fluticasone  or mometasone for any persistent nasal congestion/runny nose.  Additional medications to consider includes intranasal antihistamine azelastine (Astepro).      Pain / Fever Meds -   Please use 800 mg ibuprofen  and 1000 mg Tylenol  every 6 hours as needed for persistent pain / fever.

## 2024-10-05 ENCOUNTER — Ambulatory Visit: Admitting: Rehabilitative and Restorative Service Providers"

## 2024-10-12 ENCOUNTER — Ambulatory Visit: Admitting: Rehabilitative and Restorative Service Providers"

## 2024-10-15 ENCOUNTER — Ambulatory Visit: Admitting: Rehabilitative and Restorative Service Providers"

## 2024-10-15 ENCOUNTER — Encounter (HOSPITAL_BASED_OUTPATIENT_CLINIC_OR_DEPARTMENT_OTHER): Payer: Self-pay | Admitting: Family Medicine

## 2024-10-15 DIAGNOSIS — J019 Acute sinusitis, unspecified: Secondary | ICD-10-CM

## 2024-10-16 ENCOUNTER — Ambulatory Visit (INDEPENDENT_AMBULATORY_CARE_PROVIDER_SITE_OTHER)

## 2024-10-16 ENCOUNTER — Inpatient Hospital Stay (INDEPENDENT_AMBULATORY_CARE_PROVIDER_SITE_OTHER)
Admission: RE | Admit: 2024-10-16 | Discharge: 2024-10-16 | Disposition: A | Discharge status: Home / Self Care | Source: Ambulatory Visit

## 2024-10-16 ENCOUNTER — Ambulatory Visit (INDEPENDENT_AMBULATORY_CARE_PROVIDER_SITE_OTHER): Payer: Self-pay

## 2024-10-16 VITALS — BP 124/78 | HR 79 | Temp 98.5°F | Ht 64.0 in | Wt 177.2 lb

## 2024-10-16 MED ORDER — ALBUTEROL SULFATE HFA 90 MCG/ACTUATION AEROSOL INHALER: 1.0000 | INHALATION_SPRAY | Freq: Four times a day (QID) | RESPIRATORY_TRACT | 3 refills | Status: AC | PRN

## 2024-10-16 MED ORDER — BENZONATATE 100 MG CAPSULE: 100.0000 mg | ORAL_CAPSULE | Freq: Three times a day (TID) | ORAL | 0 refills | Status: AC | PRN

## 2024-10-16 MED ORDER — INHALATIONAL SPACING DEVICE: 1.0000 | 0 refills | Status: DC | PRN

## 2024-10-16 MED ORDER — DEXAMETHASONE SODIUM PHOSPHATE 4 MG/ML INJECTION SOLUTION
10.0000 mg | Freq: Once | INTRAMUSCULAR | Status: AC
  Administered 2024-10-16: 10 mg via ORAL

## 2024-10-16 MED ORDER — INHALATIONAL SPACING DEVICE: 1.0000 | 0 refills | Status: AC | PRN

## 2024-10-16 NOTE — Progress Notes (Signed)
 Chief Complaint   Patient presents with    Cough     Cough, chills, congestion x 4 weeks. Taking robutussin and mucinex  and motrin  to treat. Productive cough with green sputum.        HPI: Amanda Williamson is a 79yr female with a past medical history of hypertension, dyslipidemia, type 2 diabetes, and reactive airway disease who presents to the [facility] complaining of a cough, congestion, sweats, chills, and intermittent wheezing which have been ongoing for approximately 3 to 4 weeks.  Initial symptoms began in early to mid December.  The patient states her symptoms were improving however then worsened over the past week to several days.  She states she notices some intermittent shortness of breath/wheezing most particularly in the mornings and in the evenings.  She has been taking over-the-counter Robitussin, Mucinex , intranasal azelastine , as well as using a humidifier in sinus rinses without any improvement.  She denies any documented fevers, sinus pain/pressure, ear pain/drainage, sore throat, chest pain, palpitations, leg swelling, orthopnea, abdominal pain, nausea, vomiting, diarrhea, dysuria, or hematuria.  The patient reports in the past she has been prescribed inhalers when she has gotten sick, does not currently have an inhaler.  She is concerned she may have pneumonia as her symptoms have been ongoing for so long and are not improving.    Past Medical History[1]    Past Surgical History[2]    Family History[3]    Social History     Socioeconomic History    Marital status: SINGLE     Spouse name: Not on file    Number of children: Not on file    Years of education: Not on file    Highest education level: Not on file   Occupational History    Not on file   Tobacco Use    Smoking status: Never    Smokeless tobacco: Never   Substance and Sexual Activity    Alcohol use: No    Drug use: No    Sexual activity: Not on file    Other Topics Concern    Not on file   Social History Narrative    Daughter Oleh. Retired [employer].     Social Drivers of Psychologist, Prison And Probation Services Strain: Not on File (05/18/2020)    Received from [employer]     Financial Resource Strain: 0   Food Insecurity: Not on File (06/30/2023)    Received from [employer]     Food: 0   Transportation Needs: Not on File (05/18/2020)    Received from [employer] [facility]     Transportation: 0   Physical Activity: Not on File (05/18/2020)    Received from [facility]    Physical Activity     Physical Activity: 0   Stress: Not on File (05/18/2020)    Received from [facility]    Stress     Stress: 0   Social Connections: Not on File (06/16/2023)    Received from [employer]    Social Connections     Connectedness: 0   Abuse: Not on file   Housing Stability: Not on File (05/18/2020)    Received from [employer]     Housing: 0       Allergies:   Clindamycin    Nausea/Vomiting    Medication: Current Medications[4]    ROS:  Review of Systems   Constitutional:  Positive for chills. Negative for fever.   HENT:  Positive for congestion. Negative for ear discharge, ear pain, sinus pressure, sinus pain, sore throat, trouble swallowing and voice change.    Respiratory:  Positive for cough and wheezing. Negative for shortness of breath.    Cardiovascular:  Negative for chest pain, palpitations and leg swelling.   Gastrointestinal:  Negative for abdominal pain, diarrhea, nausea and vomiting.   Genitourinary:  Negative for dysuria and hematuria.   Musculoskeletal:  Negative for back pain and neck pain.   Skin:  Negative for rash.   Neurological:  Negative for syncope and headaches.       PE:  Physical Exam  Vitals and nursing note reviewed.   Constitutional:       General: She is not in acute distress.      Appearance: She is not toxic-appearing.   HENT:      Head: Normocephalic.      Jaw: There is normal jaw occlusion. No trismus.      Right Ear: Ear canal and external ear normal. A middle ear effusion is present. Tympanic membrane is not erythematous or bulging.      Left Ear: Ear canal and external ear normal. A middle ear effusion is present. Tympanic membrane is not erythematous or bulging.      Nose:      Right Sinus: No maxillary sinus tenderness or frontal sinus tenderness.      Left Sinus: No maxillary sinus tenderness or frontal sinus tenderness.      Mouth/Throat:      Lips: Pink.      Mouth: Mucous membranes are moist.      Pharynx: Oropharynx is clear. Uvula midline.      Tonsils: No tonsillar exudate or tonsillar abscesses.   Eyes:      Extraocular Movements: Extraocular movements intact.      Conjunctiva/sclera: Conjunctivae normal.   Neck:      Trachea: Trachea and phonation normal.   Cardiovascular:      Rate and Rhythm: Normal rate.      Pulses: Normal pulses.   Pulmonary:      Effort: Pulmonary effort is normal. No tachypnea, accessory muscle usage or respiratory distress.      Breath sounds: No rhonchi or rales.      Comments: Speaking in full sentences, frequent nonproductive cough, very faint end expiratory wheeze  Abdominal:      General: There is no distension.   Musculoskeletal:         General: Normal range of motion.      Cervical back: Normal range of motion and neck supple.      Right lower leg: No edema.      Left lower leg: No edema.   Skin:     General: Skin is warm and dry.      Capillary Refill: Capillary refill takes less than 2 seconds.   Neurological:      General: No focal deficit present.      Mental Status: She is alert and oriented to person, place, and time.   Psychiatric:         Mood and Affect: Mood normal.         Temp: 36.9 C (98.5 F) (01/13 1127)  Temp src: Oral (01/13 1127)  Pulse: 79 (01/13 1127)  BP: 124/78 (01/13 1127)  Resp: --  SpO2: 98 % (01/13 1127)  Height:  162.6 cm (5' 4) (01/13 1127)  Weight: 80.4 kg (177 lb  3.2 oz) (01/13 1127)    Differentials: URI, sinusitis, gastroesophageal disease, allergies, viral respiratory infection, pneumonia, RAD, bronchitis, COVID-19      Radiology:[facility] CHEST 2 VIEW  Result Date: 10/16/2024  CHEST 2 VIEWS: TECHNIQUE: PA and lateral chest. INDICATION: Persistent cough. COMPARISON: 09/28/2024. FINDINGS: The lungs are clear. Cardiac size and pulmonary vasculature are within normal limits. There is no pleural effusion or pneumothorax. No acute osseous abnormalities.     IMPRESSION: No acute cardiopulmonary disease process. Electronically Signed By: Marea Parsons, MD            Chart Review:   -- EHR Reviewed including primary care visit on 1219    Assessment and Plan: Amanda Williamson is a 63yr female presenting to the [facility] with continued cough, congestion, and intermittent wheezing over the past 3-4 weeks despite supportive therapies.  Vital signs stable.There was no evidence of consolidation on lung exam or chest X-ray concerning for pneumonia. There was very faint end expiratory wheezing without evidence of work of breathing concerning for an asthma exacerbation.  Chest x-ray showed no evidence of acute cardiopulmonary disease process.  Patient given Decadron  10 mg p.o. to help with inflammation in the lungs.  A prescription for Tessalon  to help with the cough as well as an albuterol  inhaler and a spacer for likely reactive airway disease was sent to the pharmacy.  There is no evidence of acute otitis media, acute otitis externa, acute pharyngitis, sinusitis, CHF, or GERD as cause of symptoms.  Patient is hemodynamically stable without evidence of systemic changes concerning for sepsis.  She is advised to continue with supportive therapies and to follow-up with primary care  provider in 1 to 2 weeks if needed.    Clinical Impression:     ICD-10-CM    1. Acute cough  R05.1 [facility] CHEST 2 VIEW     benzonatate  (Tessalon ) 100 mg capsule      2. Viral URI  J06.9       3. Mild intermittent reactive airway disease without complication  J45.20 albuterol  (Proair  HFA, Proventil  HFA, Ventolin  HFA) 90 mcg/actuation inhaler     inhalational spacing device (Space Chamber) spacer          Disposition:     Disposition: Discharge, discharge instructions were reviewed.   Follow up: PCP if needed      PATIENT'S GENERAL CONDITION:  Fair: Vital signs are stable and within normal limits. Patient is conscious but may be uncomfortable. Indicators are favorable.    Though I suspect no acutely life threatening condition, I did recommend patient go to the ED for any new or worse problems. Patient was advised to follow up with PCP for further evaluation as soon as reasonable. Patient is aware their evaluation was not complete and primarily acute issues were evaluated and there is still a possibility of chronic issues requiring further evaluation.     Comment: Please note this record has been produced using speech recognition system and may contain errors related to that system including errors in grammar, punctuation, and spelling, as well as words and phrases that may be inappropriate. If there are any questions or concerns please feel free to contact the dictating provider for clarification.    Electronically Signed by: Alan Bless, PA-C         [1]   Past Medical History:  Diagnosis Date    Diabetes (HCC)     Erythema nodosum     H/O mammogram 09/10/2016    Kidney stones  PFS (patellofemoral syndrome)     Knee pain    PONV (postoperative nausea and vomiting)     RAD (reactive airway disease)    [2]   Past Surgical History:  Procedure Laterality Date    ------------OTHER------------- Right 02/2018    rotator cuff    COLONOSCOPY  07/08/2012    WNL    COLONOSCOPY  02/24/2015    ESOPHAGOGASTRODUODENOSCOPY (EGD)   02/24/2015    LIGATION, FALLOPIAN TUBE, BILATERAL, USING TUBAL RING  1994    PR ARTHRS KNEE DEBRIDEMENT/SHAVING ARTCLR CRTLG Right 12/24/2020    PR ARTHRS KNEE W/MENISCECTOMY MED&LAT W/SHAVING Right 12/24/2020   [3]   Family History  Problem Relation Name Age of Onset    Arthritis Mother          Rheumitoid    Lipids Mother      Diabetes Father      Other (Other) Father          Kidney faliure    No Known Problems Sister      No Known Problems Brother      No Known Problems Brother      No Known Problems Brother      No Known Problems Daughter      No Known Problems Daughter      No Known Problems Daughter      Other (Other) Son          pulmonary    Diabetes Son          childhood   [4]   Current Outpatient Medications:     Acetaminophen (TYLENOL) 500 mg Tablet, Take 650 mg by mouth every 6 hours if needed for pain or Fever., Disp: , Rfl:     albuterol  (Proair  HFA, Proventil  HFA, Ventolin  HFA) 90 mcg/actuation inhaler, Take 1-2 puffs by inhalation every 6 hours if needed., Disp: 36 g, Rfl: 3    Azelastine  Nasal (ASTELIN ) 137 mcg (0.1 %) Spray, Instill 1 spray into EACH nostril 2 times daily., Disp: 90 mL, Rfl: 0    benzonatate  (Tessalon ) 100 mg capsule, Take 1 capsule by mouth three times daily if needed for cough., Disp: 30 capsule, Rfl: 0    gabapentin  (Neurontin ) 400 mg capsule, TAKE 1 CAPSULE BY MOUTH TWICE A DAY, Disp: 180 capsule, Rfl: 3    inhalational spacing device (Space Chamber) spacer, 1 each if needed., Disp: 1 each, Rfl: 0    Lisinopril  (PRINIVIL , ZESTRIL ) 2.5 mg Tablet, Take 1 tablet by mouth every day., Disp: 90 tablet, Rfl: 3    Metformin  (GLUCOPHAGE ) 1,000 mg tablet, Take 1 tablet by mouth 2 times daily with meals., Disp: 180 tablet, Rfl: 3    metroNIDAZOLE  (Metrogel ) 1 % gel, USE A THIN LAYER TO AFFECTED AREAS AFTER WASHING ONCE A DAY, Disp: 60 g, Rfl: 1    MULTIVITAMIN PO, Take 1 tablet by mouth every morning. Twice a day, Disp: , Rfl:     Psyllium Husk 0.52 gram Capsule, Take 1 capsule by mouth  every morning., Disp: , Rfl:     semaglutide  1 mg/dose (4 mg/3 mL) Pen Injector, Inject 1 mg subcutaneously one time each week. Check flow before first injection with each new pen., Disp: 9 mL, Rfl: 3    Simvastatin  (ZOCOR ) 10 mg Tablet, Take 1 tablet by mouth every day., Disp: 90 tablet, Rfl: 3    TURMERIC PO, Take 1 capsule by mouth every day at bedtime., Disp: , Rfl:     Current Facility-Administered Medications:  dexAMETHasone  (Decadron ) injection 10 mg, 10 mg, ORAL, ONCE,

## 2024-10-16 NOTE — Patient Instructions (Signed)
 Your workup in the walk-in clinic does not show any evidence of pneumonia.  I suspect you have a viral upper respiratory infection with likely reactive airway disease.  A prescription for Tessalon  to help with the cough as well as an albuterol  inhaler and a spacer to help with wheezing was sent to the pharmacy.  Continue with supportive therapies, over-the-counter Mucinex , Motrin , oral hydration, humidifier.  You were given a steroid today which will help with inflammation in the lungs over the next several days.  Please follow-up with primary care provider if your symptoms continue.  Please go directly to the emergency department if you develop any shortness of breath, chest pain, or other emergent symptoms.

## 2024-10-17 ENCOUNTER — Ambulatory Visit: Admitting: Rehabilitative and Restorative Service Providers"

## 2024-10-18 MED ORDER — AZELASTINE 137 MCG (0.1 %) NASAL SPRAY: 1.0000 | Freq: Two times a day (BID) | NASAL | 0 refills | Status: AC

## 2024-10-18 NOTE — Telephone Encounter (Signed)
 Please verify correct pharmacy  Please ensure 90 day supply for mail order pharmacies.  Please verify prescription days equals quantity x refills  BP Readings from Last 1 Encounters:   10/16/24 124/78      Pulse Readings from Last 1 Encounters:   10/16/24 79     Lab Results   Component Value Date    WBC 4.9 05/09/2024    HGB 13.7 05/09/2024    PLT 232 05/09/2024    NA 138 05/09/2024    K 4.2 05/09/2024    GLU 101 05/09/2024    CR 0.70 05/09/2024    ALT 16 05/09/2024     Recent Visits  Date Type Provider Dept   09/24/24 Telephone Marney Herring, PA [facility]   09/21/24 Office Visit Marney Herring, PA [facility]   05/09/24 Office Visit [name], NP [facility]   05/08/24 Telephone [name], NP [facility]   04/25/24 Office Visit [name], NP [facility]   01/18/24 Office Visit [name], NP [facility]   12/19/23 Telephone [name], NP [facility]   11/23/23 Telephone [name], NP [facility]   Showing recent visits within past 365 days and meeting all other requirements  Future Appointments  Date Type Provider Dept   11/28/24 Appointment [name], NP [facility]   Showing future appointments within next 180 days and meeting all other requirements        [facility] [address]           User Date Status   BARRACLOUGH, JENNA  LEWKOWITZ, AUDREY  VANCE, WARREN TAYLOR 10/21/2020 12:14 PM  10/29/2020  4:54 PM  12/05/2020 12:43 PM Reviewed PDMP [1]  Reviewed PDMP [1]  Reviewed PDMP [1]               Current Medications  has a current medication list which includes the following prescription(s): acetaminophen, albuterol , azelastine  nasal, benzonatate , gabapentin , inhalational spacing device, lisinopril , metformin , metronidazole , multivitamin, psyllium husk, semaglutide , simvastatin , and turmeric.

## 2024-10-26 ENCOUNTER — Ambulatory Visit: Attending: Adult Health

## 2024-10-26 ENCOUNTER — Ambulatory Visit
Attending: Student in an Organized Health Care Education/Training Program | Admitting: Rehabilitative and Restorative Service Providers"

## 2024-10-26 ENCOUNTER — Ambulatory Visit (HOSPITAL_BASED_OUTPATIENT_CLINIC_OR_DEPARTMENT_OTHER)

## 2024-10-26 VITALS — BP 128/78 | HR 86 | Temp 97.8°F | Resp 16 | Ht 64.0 in | Wt 172.6 lb

## 2024-10-26 LAB — COMPREHENSIVE METABOLIC PANEL
Adjusted Calcium: 9.3 mg/dL (ref 8.7–10.2)
Alanine Transferase (ALT): 15 U/L (ref 7–52)
Alb/Glob Ratio: 1.4 (ref 1.0–1.6)
Albumin: 4.3 g/dL (ref 3.5–5.7)
Alkaline Phosphatase (ALP): 72 U/L (ref 34–104)
Aspartate Transaminase (AST): 17 U/L (ref 13–39)
BUN/ Creatinine: 17.6 (ref 7.3–21.7)
Bilirubin Total: 0.4 mg/dL (ref 0.3–1.0)
Calcium: 9.5 mg/dL (ref 8.6–10.3)
Carbon Dioxide Total: 27 mmol/L (ref 21–31)
Chloride: 103 mmol/L (ref 98–107)
Creatinine Serum: 0.68 mg/dL (ref 0.60–1.20)
Globulin: 3 g/dL (ref 2.2–4.2)
Glucose: 114 mg/dL — ABNORMAL HIGH (ref 74–109)
Potassium: 4.3 mmol/L (ref 3.5–5.0)
Protein: 7.3 g/dL (ref 6.4–8.9)
Sodium: 137 mmol/L (ref 136–145)
Urea Nitrogen, Blood (BUN): 12 mg/dL (ref 6–20)
eGFR Creatinine (Female): 101 mL/min/{1.73_m2} (ref >60)

## 2024-10-26 LAB — CBC WITH DIFFERENTIAL
Basophils % Auto: 0.6 % (ref 0.0–1.0)
Basophils Abs Auto: 0 10*3/uL (ref 0.0–0.1)
Eosinophils % Auto: 2.9 % (ref 0.0–4.0)
Eosinophils Abs Auto: 0.2 10*3/uL (ref 0.0–0.2)
Hematocrit: 39.1 % (ref 36.0–48.0)
Hemoglobin: 13.9 g/dL (ref 12.0–16.0)
Immature Granulocytes % Auto: 0.4 % (ref 0.00–0.50)
Immature Granulocytes Abs Auto: 0 10*3/uL (ref 0.0–0.0)
Lymphocytes % Auto: 30 % (ref 5.0–41.0)
Lymphocytes Abs Auto: 2.2 10*3/uL (ref 1.3–2.9)
MCH: 30.7 pg (ref 27.0–34.0)
MCHC g/dL: 35.5 g/dL (ref 33.0–37.0)
MCV: 86.3 fL (ref 82.0–97.0)
MPV: 10.3 fL (ref 9.4–12.4)
Monocytes % Auto: 8 % (ref 0.0–10.0)
Monocytes Abs Auto: 0.6 10*3/uL (ref 0.3–0.8)
Neutrophils % Auto: 58.1 % (ref 45.0–75.0)
Neutrophils Abs Auto: 4.16 10*3/uL (ref 2.20–4.80)
Nucleated Cell Count: 0 10*3/uL (ref 0.0–0.1)
Nucleated RBC/100 WBC: 0 %{WBCs} (ref <=0.0)
Platelet Count: 254 10*3/uL (ref 151–365)
RDW: 13 % (ref 11.5–14.5)
Red Blood Cell Count: 4.53 10*6/uL (ref 3.80–5.10)
White Blood Cell Count: 7.2 10*3/uL (ref 4.2–10.8)

## 2024-10-26 LAB — MICROALBUMIN
Microalbumin Urine: <7 mg/L
Microalbumin/Creatinine Ratio: <30 mg/g (ref <=30)

## 2024-10-26 LAB — LIPID PANEL
Cholesterol: 131 mg/dL (ref <200)
HDL Cholesterol: 36 mg/dL — ABNORMAL LOW (ref >=40.0)
LDL Cholesterol Calculation: 73 mg/dL (ref <100)
Non-HDL Cholesterol: 95 mg/dL (ref <=150.0)
Total Cholesterol: HDL Ratio: 3.6 mg/dL (ref 2.0–5.0)
Triglyceride Level: 109 mg/dL (ref <150)

## 2024-10-26 LAB — HEMOGLOBIN A1C
Hgb A1C,Glucose Est Avg: 131 mg/dL
Hgb A1C: 6.2 % — ABNORMAL HIGH (ref 4.0–5.6)

## 2024-10-26 LAB — CREATININE SPOT URINE: Creatinine Spot Urine: 23.66 mg/dL

## 2024-10-26 LAB — THYROID STIMULATING HORMONE: Thyroid Stimulating Hormone: 2.71 u[IU]/mL (ref 0.45–5.33)

## 2024-10-26 LAB — SED RATE WESTERGREN: Sed Rate Westergren: 10 mm/h (ref 0–20)

## 2024-10-26 LAB — C-REACTIVE PROTEIN: C-Reactive Protein: 2 mg/L (ref <=10)

## 2024-10-26 MED ORDER — PREDNISONE 10 MG TABLET: ORAL_TABLET | ORAL | 0 refills | Status: AC

## 2024-10-26 NOTE — Allied Health Progress (Signed)
 Physical Therapy Treatment Note: 10/26/2024              Diagnosis:  Therapy diagnosis:   M17.11 (ICD-10-CM) - Primary osteoarthritis of right knee   M76.31 (ICD-10-CM) - It band syndrome, right   M25.561, right knee pain   Medical diagnosis:   M17.11 (ICD-10-CM) - Primary osteoarthritis of right knee   M76.31 (ICD-10-CM) - It band syndrome, right         Authorizing MD (First and last name) and PI#: Dr Lonni Dinning  Onset Date: 06/15/2024     Start of Care Date: 09/03/2024   Prior Level of function: from July to September 2025 no right knee and right thigh pain after receiving physical therapy.  Prior treatment for this diagnosis: yes  Rehab potential: Good     Precautions: peripheral neuropathy  Type 2 DM     Patient's goals are to decrease her right hip, thigh and knee pain.        Plan of care:  Established 09/03/2024              1 times a week for a total of 10 treatments.   To include   Manual therapy for soft tissue mobilization  Therapeutic exercises for stretches and strengthening exercises bilateral LE's  Home Exercise Program  Patient/Caregiver Education     Goals set on 09/03/2024  Patient will be able to walk 20 minutes without right knee and right lateral thigh pain 3/10 at worst.  Patient will be able to ascend/descend 5 steps in and out of mobile home without right knee and right lateral thigh pain 3/10 at worst.  Patient will be independent with home exercise program and home recommendations   Patient will be able to tolerate 10 minutes in right sidelying position.              Total # of visits: 4/10     Treatment start time: 1532                 Treatment stop time: 1600     Total Treatment Time: 28 minutes        Subjective: patient has been very ill with RSV     What matters most for today's session:  decrease right hip pain     Objective: Patient seen for the following treatment:   Manual therapy 28 minutes for soft tissue mobilization to right gluteals, right hip flexors and iliotibial  band.           Assessment: minimal tenderness over above areas.     Plan: check next treatment if patient well enough to resume her exercises.    In the event of unattended discharge, this note will serve as discharge document    Norleen Henry, PT

## 2024-10-26 NOTE — Progress Notes (Signed)
 Family Practice Clinic Visit     Date: 10/26/2024  Patient Name: Amanda Williamson  DOB: 19-Jul-1966      CHIEF COMPLAINT:   Chief Complaint   Patient presents with    Other     Cough        HISTORY OF PRESENT ILLNESS:  Amanda Williamson is a 59yr old female with a past medical history of GAD, allergies, type 2 diabetes, dyslipidemia, and hypertension here for persistent cough and chest tightness that she has been seen for multiple times - once here and twice at the walk in clinic.  She also complains of night sweats and chills.  There has been only benign findings on past examinations by other providers.  She has had 2 normal chest x-rays.  She has been treated symptomatically with Tessalon  Perles, albuterol , prednisone , Sudafed, NyQuil, and ibuprofen .  Four days ago, she found mold in 2 of her walls in her bedroom which she thought was causing her symptoms.  The mold was removed, however her symptoms remain.  She feels that nothing has helped except for the prednisone .  She is looking for answers and is getting frustrated that she is not getting better.    Current Outpatient Medications on File Prior to Visit   Medication    Acetaminophen (TYLENOL) 500 mg Tablet    albuterol  (Proair  HFA, Proventil  HFA, Ventolin  HFA) 90 mcg/actuation inhaler    azelastine  nasal (Astelin ) 137 mcg (0.1 %) spray    benzonatate  (Tessalon ) 100 mg capsule    gabapentin  (Neurontin ) 400 mg capsule    inhalational spacing device (Space Chamber) spacer    Lisinopril  (PRINIVIL , ZESTRIL ) 2.5 mg Tablet    Metformin  (GLUCOPHAGE ) 1,000 mg tablet    metroNIDAZOLE  (Metrogel ) 1 % gel    MULTIVITAMIN PO    Psyllium Husk 0.52 gram Capsule    semaglutide  1 mg/dose (4 mg/3 mL) Pen Injector    Simvastatin  (ZOCOR ) 10 mg Tablet    TURMERIC PO     No current facility-administered medications on file prior to visit.     ALLERGIES:  Allergies[1]  Past Medical History[2]  Past Surgical History[3]      ROS  Review of Systems   Constitutional:  Positive for chills  (night), diaphoresis (night) and fatigue. Negative for fever.   HENT:  Negative for congestion, ear pain and sore throat.    Respiratory:  Positive for cough. Negative for shortness of breath.    Psychiatric/Behavioral:  Positive for sleep disturbance.         Vitals: BP 128/78 (SITE: left arm, Orthostatic Position: sitting, Cuff Size: regular)   Pulse 86   Temp 36.6 C (97.8 F) (Temporal)   Resp 16   Ht 1.626 m (5' 4)   Wt 78.3 kg (172 lb 9.6 oz)   LMP 07/24/2016   SpO2 99%   BMI 29.63 kg/m   Patient's last menstrual period was 07/24/2016.      Physical Exam  Vitals reviewed.   Constitutional:       Appearance: She is obese.   HENT:      Head: Atraumatic.   Cardiovascular:      Rate and Rhythm: Normal rate.   Skin:     General: Skin is warm and dry.   Neurological:      Mental Status: She is alert and oriented to person, place, and time.   Psychiatric:         Mood and Affect: Mood normal.  Behavior: Behavior normal.       Chest XR 09/28/24:  IMPRESSION:  No acute cardiopulmonary process is detected.    Chest XR 10/16/24:  IMPRESSION: No acute cardiopulmonary disease process.     ASSESSMENT AND PLAN:    Symptoms of unknown etiology lasting a total of about 45 days.  1. Subacute cough  - predniSONE  (Deltasone ) 10 mg tablet; 40mg  daily x 3 days, 30mg  daily x 3 days, 20mg  daily x 3 days, 10mg  daily x 3 days  Dispense: 30 tablet; Refill: 0  - C-Reactive Protein; Future  - Sed Rate Westergren; Future  - Continue symptomatic treatment with rest and drinking water  - Discussed with patient can get mold testing, however informed patient that most insurances will not cover, so she declines today    2. Fatigue, unspecified type  - CBC with Differential; Future  - Comprehensive Metabolic Panel; Future  - Thyroid  Stimulating Hormone; Future  - Will check for infection, anemia, electrolyte imbalances, thyroid  imbalances    3. Night sweats  - QuantiFERON,TB Gold Plus; Future       Dorrell Mitcheltree L. Lainy Wrobleski, PA-C,  PA    Return if symptoms worsen or fail to improve.    Electronically signed.         [1]   Allergies  Allergen Reactions    Clindamycin Nausea/Vomiting   [2]   Past Medical History:  Diagnosis Date    Diabetes (HCC)     Erythema nodosum     H/O mammogram 09/10/2016    Kidney stones     PFS (patellofemoral syndrome)     Knee pain    PONV (postoperative nausea and vomiting)     RAD (reactive airway disease)    [3]   Past Surgical History:  Procedure Laterality Date    ------------OTHER------------- Right 02/2018    rotator cuff    COLONOSCOPY  07/08/2012    WNL    COLONOSCOPY  02/24/2015    ESOPHAGOGASTRODUODENOSCOPY (EGD)  02/24/2015    LIGATION, FALLOPIAN TUBE, BILATERAL, USING TUBAL RING  1994    PR ARTHRS KNEE DEBRIDEMENT/SHAVING ARTCLR CRTLG Right 12/24/2020    PR ARTHRS KNEE W/MENISCECTOMY MED&LAT W/SHAVING Right 12/24/2020

## 2024-10-27 ENCOUNTER — Other Ambulatory Visit (HOSPITAL_BASED_OUTPATIENT_CLINIC_OR_DEPARTMENT_OTHER): Payer: Self-pay | Admitting: Adult Health

## 2024-10-27 DIAGNOSIS — E785 Hyperlipidemia, unspecified: Secondary | ICD-10-CM

## 2024-10-27 DIAGNOSIS — I10 Essential (primary) hypertension: Secondary | ICD-10-CM

## 2024-10-29 ENCOUNTER — Ambulatory Visit (INDEPENDENT_AMBULATORY_CARE_PROVIDER_SITE_OTHER): Payer: Self-pay | Admitting: Adult Health

## 2024-10-29 ENCOUNTER — Ambulatory Visit

## 2024-10-29 NOTE — Telephone Encounter (Signed)
 BP Readings from Last 3 Encounters:   10/26/24 128/78   10/16/24 124/78   09/28/24 118/84      Pulse Readings from Last 3 Encounters:   10/26/24 86   10/16/24 79   09/28/24 64     Lab Results   Component Value Date    NA 137 10/26/2024    K 4.3 10/26/2024    CR 0.68 10/26/2024    LDLC 73 10/26/2024    HDL 36.0 (L) 10/26/2024     Recent Visits  Date Type Provider Dept   05/09/24 Office Visit [name], NP [facility]   05/08/24 Telephone [name], NP [facility]   04/25/24 Office Visit [name], NP [facility]   01/18/24 Office Visit [name], NP [facility]   12/19/23 Telephone [name], NP [facility]   11/23/23 Telephone [name], NP [facility]   Showing recent visits within past 365 days and meeting all other requirements  Future Appointments  Date Type Provider Dept   11/28/24 Appointment [name], NP [facility]   Showing future appointments within next 180 days and meeting all other requirements    Cardiac Medications in Epic (for confirmation)              Lab Results   Component Value Date    AST 17 10/26/2024    ALT 15 10/26/2024    CHOL 131 10/26/2024    LDLC 73 10/26/2024    LDLC 87 04/20/2024    HDL 36.0 (L) 10/26/2024    TRIG 109 10/26/2024      Lipid Medications in Epic (for confirmation)

## 2024-10-31 LAB — MMC QUANTIFERON TB GOLD PLUS (LAB ONLY)
Mit-Nil MMC: >9.9 [IU]/mL
Nil MMC: 0.1 [IU]/mL
Quantiferon,TB Gold Plus: NEGATIVE
TB1-Nil MMC: 0.02 [IU]/mL
TB2-Nil MMC: 0.04 [IU]/mL

## 2024-10-31 LAB — MMC QUANTIFERON TB GOLD PLUS - TB2 (LAB ONLY): TB2 MMC: 0.14 [IU]/mL

## 2024-10-31 LAB — MMC QUANTIFERON TB GOLD PLUS - MIT (LAB ONLY): MIT MMC: >10 [IU]/mL

## 2024-10-31 LAB — MMC QUANTIFERON TB GOLD PLUS - TB1 (LAB ONLY): TB1 MMC: 0.12 [IU]/mL

## 2024-11-01 ENCOUNTER — Telehealth (HOSPITAL_BASED_OUTPATIENT_CLINIC_OR_DEPARTMENT_OTHER): Payer: Self-pay

## 2024-11-01 NOTE — Addendum Note (Signed)
 Addended byBETHA COOLIDGE GERALDS on: 11/01/2024 09:02 AM     Modules accepted: Orders

## 2024-11-01 NOTE — Telephone Encounter (Addendum)
 Called patient and informed her of her lab results and let her know that the PA order her a ct scan. Patient states that the prednisone  is helping a lot and that she no longer has a cough. thank you for the call.

## 2024-11-02 ENCOUNTER — Encounter (HOSPITAL_BASED_OUTPATIENT_CLINIC_OR_DEPARTMENT_OTHER): Payer: Self-pay

## 2024-11-09 ENCOUNTER — Ambulatory Visit: Admitting: Rehabilitative and Restorative Service Providers"

## 2024-11-23 ENCOUNTER — Ambulatory Visit

## 2024-11-28 ENCOUNTER — Ambulatory Visit (INDEPENDENT_AMBULATORY_CARE_PROVIDER_SITE_OTHER): Admitting: Adult Health
